# Patient Record
Sex: Female | Born: 1945 | Race: Black or African American | Hispanic: No | Marital: Married | Smoking: Former smoker
Health system: Southern US, Community
[De-identification: ages and names within clinical notes are randomized; demographics above are authoritative.]

## PROBLEM LIST (undated history)

## (undated) DIAGNOSIS — I10 Essential (primary) hypertension: Secondary | ICD-10-CM

## (undated) DIAGNOSIS — M898X9 Other specified disorders of bone, unspecified site: Secondary | ICD-10-CM

## (undated) DIAGNOSIS — M1990 Unspecified osteoarthritis, unspecified site: Secondary | ICD-10-CM

## (undated) DIAGNOSIS — G4730 Sleep apnea, unspecified: Secondary | ICD-10-CM

## (undated) DIAGNOSIS — E785 Hyperlipidemia, unspecified: Secondary | ICD-10-CM

## (undated) DIAGNOSIS — E669 Obesity, unspecified: Secondary | ICD-10-CM

## (undated) DIAGNOSIS — I2089 Other forms of angina pectoris: Secondary | ICD-10-CM

## (undated) DIAGNOSIS — I498 Other specified cardiac arrhythmias: Secondary | ICD-10-CM

## (undated) DIAGNOSIS — I440 Atrioventricular block, first degree: Secondary | ICD-10-CM

## (undated) DIAGNOSIS — I208 Other forms of angina pectoris: Secondary | ICD-10-CM

## (undated) DIAGNOSIS — H269 Unspecified cataract: Secondary | ICD-10-CM

## (undated) DIAGNOSIS — M2550 Pain in unspecified joint: Secondary | ICD-10-CM

## (undated) HISTORY — DX: Hyperlipidemia, unspecified: E78.5

## (undated) HISTORY — DX: Pain in unspecified joint: M25.50

## (undated) HISTORY — DX: Essential (primary) hypertension: I10

## (undated) HISTORY — DX: Sleep apnea, unspecified: G47.30

## (undated) HISTORY — PX: EYE SURGERY: SHX253

## (undated) HISTORY — PX: ECTOPIC PREGNANCY SURGERY: SHX613

## (undated) HISTORY — PX: TONSILECTOMY, ADENOIDECTOMY, BILATERAL MYRINGOTOMY AND TUBES: SHX2538

## (undated) HISTORY — PX: BREAST SURGERY: SHX581

## (undated) HISTORY — PX: COLONOSCOPY: SHX174

## (undated) HISTORY — DX: Unspecified cataract: H26.9

## (undated) HISTORY — PX: APPENDECTOMY: SHX54

## (undated) HISTORY — PX: JOINT REPLACEMENT: SHX530

## (undated) HISTORY — PX: TONSILLECTOMY: SUR1361

## (undated) HISTORY — PX: BUNIONECTOMY: SHX129

## (undated) HISTORY — PX: TUBAL LIGATION: SHX77

---

## 1968-03-03 HISTORY — PX: BREAST EXCISIONAL BIOPSY: SUR124

## 1976-03-03 HISTORY — PX: ECTOPIC PREGNANCY SURGERY: SHX613

## 2006-03-03 HISTORY — PX: BUNIONECTOMY: SHX129

## 2008-10-29 ENCOUNTER — Emergency Department: Payer: Self-pay | Admitting: Emergency Medicine

## 2011-03-14 ENCOUNTER — Ambulatory Visit: Payer: Self-pay | Admitting: Unknown Physician Specialty

## 2011-03-14 LAB — HM COLONOSCOPY

## 2011-06-30 DIAGNOSIS — M1710 Unilateral primary osteoarthritis, unspecified knee: Secondary | ICD-10-CM | POA: Diagnosis not present

## 2011-06-30 DIAGNOSIS — R9431 Abnormal electrocardiogram [ECG] [EKG]: Secondary | ICD-10-CM | POA: Diagnosis not present

## 2011-06-30 DIAGNOSIS — I10 Essential (primary) hypertension: Secondary | ICD-10-CM | POA: Diagnosis not present

## 2011-06-30 DIAGNOSIS — E785 Hyperlipidemia, unspecified: Secondary | ICD-10-CM | POA: Diagnosis not present

## 2011-07-10 DIAGNOSIS — M75100 Unspecified rotator cuff tear or rupture of unspecified shoulder, not specified as traumatic: Secondary | ICD-10-CM | POA: Diagnosis not present

## 2011-07-10 DIAGNOSIS — M1710 Unilateral primary osteoarthritis, unspecified knee: Secondary | ICD-10-CM | POA: Diagnosis not present

## 2011-07-10 DIAGNOSIS — M6530 Trigger finger, unspecified finger: Secondary | ICD-10-CM | POA: Diagnosis not present

## 2011-07-22 DIAGNOSIS — R011 Cardiac murmur, unspecified: Secondary | ICD-10-CM | POA: Diagnosis not present

## 2011-07-22 DIAGNOSIS — I209 Angina pectoris, unspecified: Secondary | ICD-10-CM | POA: Diagnosis not present

## 2011-07-25 DIAGNOSIS — M6530 Trigger finger, unspecified finger: Secondary | ICD-10-CM | POA: Diagnosis not present

## 2011-07-25 DIAGNOSIS — M75100 Unspecified rotator cuff tear or rupture of unspecified shoulder, not specified as traumatic: Secondary | ICD-10-CM | POA: Diagnosis not present

## 2011-07-25 DIAGNOSIS — M1710 Unilateral primary osteoarthritis, unspecified knee: Secondary | ICD-10-CM | POA: Diagnosis not present

## 2011-07-30 ENCOUNTER — Ambulatory Visit: Payer: Self-pay | Admitting: Internal Medicine

## 2011-07-30 DIAGNOSIS — I209 Angina pectoris, unspecified: Secondary | ICD-10-CM | POA: Diagnosis not present

## 2011-08-07 DIAGNOSIS — B350 Tinea barbae and tinea capitis: Secondary | ICD-10-CM | POA: Diagnosis not present

## 2011-08-21 DIAGNOSIS — R9431 Abnormal electrocardiogram [ECG] [EKG]: Secondary | ICD-10-CM | POA: Diagnosis not present

## 2011-08-21 DIAGNOSIS — I209 Angina pectoris, unspecified: Secondary | ICD-10-CM | POA: Diagnosis not present

## 2011-09-03 DIAGNOSIS — E785 Hyperlipidemia, unspecified: Secondary | ICD-10-CM | POA: Diagnosis not present

## 2011-09-03 DIAGNOSIS — I10 Essential (primary) hypertension: Secondary | ICD-10-CM | POA: Diagnosis not present

## 2012-01-26 DIAGNOSIS — R9431 Abnormal electrocardiogram [ECG] [EKG]: Secondary | ICD-10-CM | POA: Diagnosis not present

## 2012-01-26 DIAGNOSIS — I10 Essential (primary) hypertension: Secondary | ICD-10-CM | POA: Diagnosis not present

## 2012-01-27 DIAGNOSIS — Z1159 Encounter for screening for other viral diseases: Secondary | ICD-10-CM | POA: Diagnosis not present

## 2012-01-27 DIAGNOSIS — E785 Hyperlipidemia, unspecified: Secondary | ICD-10-CM | POA: Diagnosis not present

## 2012-01-27 DIAGNOSIS — E8881 Metabolic syndrome: Secondary | ICD-10-CM | POA: Diagnosis not present

## 2012-01-27 DIAGNOSIS — Z23 Encounter for immunization: Secondary | ICD-10-CM | POA: Diagnosis not present

## 2012-01-27 DIAGNOSIS — Z791 Long term (current) use of non-steroidal anti-inflammatories (NSAID): Secondary | ICD-10-CM | POA: Diagnosis not present

## 2012-01-27 DIAGNOSIS — Z0000 Encounter for general adult medical examination without abnormal findings: Secondary | ICD-10-CM | POA: Diagnosis not present

## 2012-01-27 DIAGNOSIS — E2839 Other primary ovarian failure: Secondary | ICD-10-CM | POA: Diagnosis not present

## 2012-01-27 DIAGNOSIS — Z124 Encounter for screening for malignant neoplasm of cervix: Secondary | ICD-10-CM | POA: Diagnosis not present

## 2012-02-11 ENCOUNTER — Ambulatory Visit: Payer: Self-pay | Admitting: Family Medicine

## 2012-02-11 DIAGNOSIS — N951 Menopausal and female climacteric states: Secondary | ICD-10-CM | POA: Diagnosis not present

## 2012-02-11 DIAGNOSIS — L919 Hypertrophic disorder of the skin, unspecified: Secondary | ICD-10-CM | POA: Diagnosis not present

## 2012-02-11 DIAGNOSIS — L909 Atrophic disorder of skin, unspecified: Secondary | ICD-10-CM | POA: Diagnosis not present

## 2012-02-11 LAB — HM DEXA SCAN: HM DEXA SCAN: NORMAL

## 2012-02-17 DIAGNOSIS — M6530 Trigger finger, unspecified finger: Secondary | ICD-10-CM | POA: Diagnosis not present

## 2012-03-08 DIAGNOSIS — Z01419 Encounter for gynecological examination (general) (routine) without abnormal findings: Secondary | ICD-10-CM | POA: Diagnosis not present

## 2012-03-08 DIAGNOSIS — Z124 Encounter for screening for malignant neoplasm of cervix: Secondary | ICD-10-CM | POA: Diagnosis not present

## 2012-03-10 LAB — HM PAP SMEAR: HM Pap smear: NORMAL

## 2012-03-15 ENCOUNTER — Ambulatory Visit: Payer: Self-pay | Admitting: Family Medicine

## 2012-03-15 DIAGNOSIS — R05 Cough: Secondary | ICD-10-CM | POA: Diagnosis not present

## 2012-03-15 DIAGNOSIS — R059 Cough, unspecified: Secondary | ICD-10-CM | POA: Diagnosis not present

## 2012-03-15 DIAGNOSIS — R062 Wheezing: Secondary | ICD-10-CM | POA: Diagnosis not present

## 2012-03-16 ENCOUNTER — Ambulatory Visit: Payer: Self-pay | Admitting: Obstetrics and Gynecology

## 2012-03-16 DIAGNOSIS — Z1231 Encounter for screening mammogram for malignant neoplasm of breast: Secondary | ICD-10-CM | POA: Diagnosis not present

## 2012-07-28 DIAGNOSIS — E8881 Metabolic syndrome: Secondary | ICD-10-CM | POA: Diagnosis not present

## 2012-07-28 DIAGNOSIS — I10 Essential (primary) hypertension: Secondary | ICD-10-CM | POA: Diagnosis not present

## 2012-07-28 DIAGNOSIS — E669 Obesity, unspecified: Secondary | ICD-10-CM | POA: Diagnosis not present

## 2012-07-28 DIAGNOSIS — E785 Hyperlipidemia, unspecified: Secondary | ICD-10-CM | POA: Diagnosis not present

## 2012-07-29 DIAGNOSIS — E785 Hyperlipidemia, unspecified: Secondary | ICD-10-CM | POA: Diagnosis not present

## 2012-07-29 DIAGNOSIS — I10 Essential (primary) hypertension: Secondary | ICD-10-CM | POA: Diagnosis not present

## 2012-12-21 DIAGNOSIS — Z23 Encounter for immunization: Secondary | ICD-10-CM | POA: Diagnosis not present

## 2012-12-23 DIAGNOSIS — R011 Cardiac murmur, unspecified: Secondary | ICD-10-CM | POA: Diagnosis not present

## 2012-12-23 DIAGNOSIS — R079 Chest pain, unspecified: Secondary | ICD-10-CM | POA: Diagnosis not present

## 2013-01-25 DIAGNOSIS — I10 Essential (primary) hypertension: Secondary | ICD-10-CM | POA: Diagnosis not present

## 2013-01-25 DIAGNOSIS — E785 Hyperlipidemia, unspecified: Secondary | ICD-10-CM | POA: Diagnosis not present

## 2013-01-25 DIAGNOSIS — E669 Obesity, unspecified: Secondary | ICD-10-CM | POA: Diagnosis not present

## 2013-01-25 DIAGNOSIS — E8881 Metabolic syndrome: Secondary | ICD-10-CM | POA: Diagnosis not present

## 2013-02-08 DIAGNOSIS — Z1331 Encounter for screening for depression: Secondary | ICD-10-CM | POA: Diagnosis not present

## 2013-02-08 DIAGNOSIS — E8881 Metabolic syndrome: Secondary | ICD-10-CM | POA: Diagnosis not present

## 2013-02-08 DIAGNOSIS — Z0000 Encounter for general adult medical examination without abnormal findings: Secondary | ICD-10-CM | POA: Diagnosis not present

## 2013-02-08 DIAGNOSIS — Z1239 Encounter for other screening for malignant neoplasm of breast: Secondary | ICD-10-CM | POA: Diagnosis not present

## 2013-02-08 DIAGNOSIS — Z9181 History of falling: Secondary | ICD-10-CM | POA: Diagnosis not present

## 2013-03-09 DIAGNOSIS — R232 Flushing: Secondary | ICD-10-CM | POA: Diagnosis not present

## 2013-03-17 ENCOUNTER — Ambulatory Visit: Payer: Self-pay | Admitting: Obstetrics and Gynecology

## 2013-03-17 DIAGNOSIS — R928 Other abnormal and inconclusive findings on diagnostic imaging of breast: Secondary | ICD-10-CM | POA: Diagnosis not present

## 2013-03-17 DIAGNOSIS — Z1231 Encounter for screening mammogram for malignant neoplasm of breast: Secondary | ICD-10-CM | POA: Diagnosis not present

## 2013-03-17 LAB — HM MAMMOGRAPHY: HM MAMMO: NORMAL

## 2013-03-24 ENCOUNTER — Ambulatory Visit: Payer: Self-pay | Admitting: Family Medicine

## 2013-03-24 DIAGNOSIS — R922 Inconclusive mammogram: Secondary | ICD-10-CM | POA: Diagnosis not present

## 2013-03-24 DIAGNOSIS — N63 Unspecified lump in unspecified breast: Secondary | ICD-10-CM | POA: Diagnosis not present

## 2013-05-05 DIAGNOSIS — H2510 Age-related nuclear cataract, unspecified eye: Secondary | ICD-10-CM | POA: Diagnosis not present

## 2013-07-19 DIAGNOSIS — H02839 Dermatochalasis of unspecified eye, unspecified eyelid: Secondary | ICD-10-CM | POA: Diagnosis not present

## 2013-07-19 DIAGNOSIS — H18419 Arcus senilis, unspecified eye: Secondary | ICD-10-CM | POA: Diagnosis not present

## 2013-07-19 DIAGNOSIS — H25019 Cortical age-related cataract, unspecified eye: Secondary | ICD-10-CM | POA: Diagnosis not present

## 2013-07-19 DIAGNOSIS — I10 Essential (primary) hypertension: Secondary | ICD-10-CM | POA: Diagnosis not present

## 2013-07-26 ENCOUNTER — Ambulatory Visit: Payer: Self-pay | Admitting: Family Medicine

## 2013-07-26 DIAGNOSIS — E785 Hyperlipidemia, unspecified: Secondary | ICD-10-CM | POA: Diagnosis not present

## 2013-07-26 DIAGNOSIS — G8929 Other chronic pain: Secondary | ICD-10-CM | POA: Diagnosis not present

## 2013-07-26 DIAGNOSIS — I10 Essential (primary) hypertension: Secondary | ICD-10-CM | POA: Diagnosis not present

## 2013-07-26 DIAGNOSIS — E8881 Metabolic syndrome: Secondary | ICD-10-CM | POA: Diagnosis not present

## 2013-07-26 DIAGNOSIS — M5137 Other intervertebral disc degeneration, lumbosacral region: Secondary | ICD-10-CM | POA: Diagnosis not present

## 2013-07-26 DIAGNOSIS — M549 Dorsalgia, unspecified: Secondary | ICD-10-CM | POA: Diagnosis not present

## 2013-07-26 DIAGNOSIS — M47817 Spondylosis without myelopathy or radiculopathy, lumbosacral region: Secondary | ICD-10-CM | POA: Diagnosis not present

## 2013-07-26 LAB — HEMOGLOBIN A1C: Hgb A1c MFr Bld: 4.9 % (ref 4.0–6.0)

## 2013-07-26 LAB — LIPID PANEL
Cholesterol: 157 mg/dL (ref 0–200)
HDL: 68 mg/dL (ref 35–70)
LDL CALC: 77 mg/dL
Triglycerides: 59 mg/dL (ref 40–160)

## 2013-09-02 DIAGNOSIS — H6120 Impacted cerumen, unspecified ear: Secondary | ICD-10-CM | POA: Diagnosis not present

## 2013-09-02 DIAGNOSIS — IMO0002 Reserved for concepts with insufficient information to code with codable children: Secondary | ICD-10-CM | POA: Diagnosis not present

## 2013-09-02 DIAGNOSIS — T169XXA Foreign body in ear, unspecified ear, initial encounter: Secondary | ICD-10-CM | POA: Diagnosis not present

## 2013-10-01 HISTORY — PX: CATARACT EXTRACTION, BILATERAL: SHX1313

## 2013-10-10 DIAGNOSIS — H2510 Age-related nuclear cataract, unspecified eye: Secondary | ICD-10-CM | POA: Diagnosis not present

## 2013-10-10 DIAGNOSIS — H269 Unspecified cataract: Secondary | ICD-10-CM | POA: Diagnosis not present

## 2013-10-11 DIAGNOSIS — H2510 Age-related nuclear cataract, unspecified eye: Secondary | ICD-10-CM | POA: Diagnosis not present

## 2013-10-24 DIAGNOSIS — H269 Unspecified cataract: Secondary | ICD-10-CM | POA: Diagnosis not present

## 2013-10-24 DIAGNOSIS — H2510 Age-related nuclear cataract, unspecified eye: Secondary | ICD-10-CM | POA: Diagnosis not present

## 2013-11-14 DIAGNOSIS — H43819 Vitreous degeneration, unspecified eye: Secondary | ICD-10-CM | POA: Diagnosis not present

## 2013-12-13 DIAGNOSIS — I10 Essential (primary) hypertension: Secondary | ICD-10-CM | POA: Diagnosis not present

## 2013-12-13 DIAGNOSIS — R9431 Abnormal electrocardiogram [ECG] [EKG]: Secondary | ICD-10-CM | POA: Diagnosis not present

## 2013-12-13 DIAGNOSIS — E669 Obesity, unspecified: Secondary | ICD-10-CM | POA: Diagnosis not present

## 2013-12-13 DIAGNOSIS — E785 Hyperlipidemia, unspecified: Secondary | ICD-10-CM | POA: Diagnosis not present

## 2014-01-30 DIAGNOSIS — J019 Acute sinusitis, unspecified: Secondary | ICD-10-CM | POA: Diagnosis not present

## 2014-01-30 DIAGNOSIS — J209 Acute bronchitis, unspecified: Secondary | ICD-10-CM | POA: Diagnosis not present

## 2014-02-10 DIAGNOSIS — E785 Hyperlipidemia, unspecified: Secondary | ICD-10-CM | POA: Diagnosis not present

## 2014-02-10 DIAGNOSIS — R7301 Impaired fasting glucose: Secondary | ICD-10-CM | POA: Diagnosis not present

## 2014-02-10 DIAGNOSIS — Z1389 Encounter for screening for other disorder: Secondary | ICD-10-CM | POA: Diagnosis not present

## 2014-02-10 DIAGNOSIS — Z23 Encounter for immunization: Secondary | ICD-10-CM | POA: Diagnosis not present

## 2014-02-10 DIAGNOSIS — I10 Essential (primary) hypertension: Secondary | ICD-10-CM | POA: Diagnosis not present

## 2014-02-10 DIAGNOSIS — Z0000 Encounter for general adult medical examination without abnormal findings: Secondary | ICD-10-CM | POA: Diagnosis not present

## 2014-03-16 DIAGNOSIS — R232 Flushing: Secondary | ICD-10-CM | POA: Diagnosis not present

## 2014-03-16 DIAGNOSIS — Z779 Other contact with and (suspected) exposures hazardous to health: Secondary | ICD-10-CM | POA: Diagnosis not present

## 2014-04-04 ENCOUNTER — Ambulatory Visit: Payer: Self-pay | Admitting: Obstetrics and Gynecology

## 2014-04-04 DIAGNOSIS — Z1231 Encounter for screening mammogram for malignant neoplasm of breast: Secondary | ICD-10-CM | POA: Diagnosis not present

## 2014-06-15 DIAGNOSIS — H3561 Retinal hemorrhage, right eye: Secondary | ICD-10-CM | POA: Diagnosis not present

## 2014-06-15 DIAGNOSIS — H43812 Vitreous degeneration, left eye: Secondary | ICD-10-CM | POA: Diagnosis not present

## 2014-06-15 DIAGNOSIS — H524 Presbyopia: Secondary | ICD-10-CM | POA: Diagnosis not present

## 2014-06-22 ENCOUNTER — Encounter: Payer: Self-pay | Admitting: Family Medicine

## 2014-06-22 DIAGNOSIS — M170 Bilateral primary osteoarthritis of knee: Secondary | ICD-10-CM | POA: Insufficient documentation

## 2014-06-22 DIAGNOSIS — Z780 Asymptomatic menopausal state: Secondary | ICD-10-CM | POA: Insufficient documentation

## 2014-06-22 DIAGNOSIS — E669 Obesity, unspecified: Secondary | ICD-10-CM | POA: Insufficient documentation

## 2014-06-22 DIAGNOSIS — G8929 Other chronic pain: Secondary | ICD-10-CM | POA: Insufficient documentation

## 2014-06-22 DIAGNOSIS — I10 Essential (primary) hypertension: Secondary | ICD-10-CM | POA: Insufficient documentation

## 2014-06-22 DIAGNOSIS — M6530 Trigger finger, unspecified finger: Secondary | ICD-10-CM | POA: Insufficient documentation

## 2014-06-22 DIAGNOSIS — E8881 Metabolic syndrome: Secondary | ICD-10-CM | POA: Insufficient documentation

## 2014-06-22 DIAGNOSIS — R9431 Abnormal electrocardiogram [ECG] [EKG]: Secondary | ICD-10-CM | POA: Insufficient documentation

## 2014-06-22 DIAGNOSIS — Z8619 Personal history of other infectious and parasitic diseases: Secondary | ICD-10-CM | POA: Insufficient documentation

## 2014-06-22 DIAGNOSIS — M549 Dorsalgia, unspecified: Secondary | ICD-10-CM

## 2014-06-22 DIAGNOSIS — E785 Hyperlipidemia, unspecified: Secondary | ICD-10-CM | POA: Insufficient documentation

## 2014-06-22 DIAGNOSIS — R7301 Impaired fasting glucose: Secondary | ICD-10-CM | POA: Insufficient documentation

## 2014-06-23 ENCOUNTER — Encounter: Payer: Self-pay | Admitting: Family Medicine

## 2014-07-05 DIAGNOSIS — H3561 Retinal hemorrhage, right eye: Secondary | ICD-10-CM | POA: Diagnosis not present

## 2014-07-05 DIAGNOSIS — H43812 Vitreous degeneration, left eye: Secondary | ICD-10-CM | POA: Diagnosis not present

## 2014-08-09 ENCOUNTER — Telehealth: Payer: Self-pay | Admitting: Family Medicine

## 2014-08-09 ENCOUNTER — Other Ambulatory Visit: Payer: Self-pay | Admitting: Family Medicine

## 2014-08-09 MED ORDER — VALSARTAN-HYDROCHLOROTHIAZIDE 320-25 MG PO TABS: 1.0000 | ORAL_TABLET | Freq: Every day | ORAL | Status: DC

## 2014-08-09 NOTE — Telephone Encounter (Signed)
Patient notified

## 2014-08-09 NOTE — Telephone Encounter (Signed)
Sent, please notify patient, thank you

## 2014-08-13 ENCOUNTER — Encounter: Payer: Self-pay | Admitting: Family Medicine

## 2014-08-13 ENCOUNTER — Other Ambulatory Visit: Payer: Self-pay | Admitting: Family Medicine

## 2014-08-14 ENCOUNTER — Encounter: Payer: Self-pay | Admitting: Family Medicine

## 2014-08-14 ENCOUNTER — Encounter (INDEPENDENT_AMBULATORY_CARE_PROVIDER_SITE_OTHER): Payer: Self-pay

## 2014-08-14 ENCOUNTER — Ambulatory Visit (INDEPENDENT_AMBULATORY_CARE_PROVIDER_SITE_OTHER): Payer: Medicare Other | Admitting: Family Medicine

## 2014-08-14 VITALS — BP 126/80 | HR 71 | Temp 97.8°F | Resp 14 | Ht 63.0 in | Wt 174.4 lb

## 2014-08-14 DIAGNOSIS — I10 Essential (primary) hypertension: Secondary | ICD-10-CM | POA: Diagnosis not present

## 2014-08-14 DIAGNOSIS — Z23 Encounter for immunization: Secondary | ICD-10-CM

## 2014-08-14 DIAGNOSIS — E785 Hyperlipidemia, unspecified: Secondary | ICD-10-CM | POA: Diagnosis not present

## 2014-08-14 DIAGNOSIS — E669 Obesity, unspecified: Secondary | ICD-10-CM

## 2014-08-14 DIAGNOSIS — H6122 Impacted cerumen, left ear: Secondary | ICD-10-CM

## 2014-08-14 DIAGNOSIS — E8881 Metabolic syndrome: Secondary | ICD-10-CM

## 2014-08-14 DIAGNOSIS — R198 Other specified symptoms and signs involving the digestive system and abdomen: Secondary | ICD-10-CM

## 2014-08-14 DIAGNOSIS — R739 Hyperglycemia, unspecified: Secondary | ICD-10-CM | POA: Diagnosis not present

## 2014-08-14 DIAGNOSIS — M25512 Pain in left shoulder: Secondary | ICD-10-CM | POA: Insufficient documentation

## 2014-08-14 DIAGNOSIS — R194 Change in bowel habit: Secondary | ICD-10-CM

## 2014-08-14 MED ORDER — VALSARTAN-HYDROCHLOROTHIAZIDE 320-25 MG PO TABS: 1.0000 | ORAL_TABLET | Freq: Every day | ORAL | Status: DC

## 2014-08-14 MED ORDER — ROSUVASTATIN CALCIUM 10 MG PO TABS: 10.0000 mg | ORAL_TABLET | Freq: Every day | ORAL | Status: DC

## 2014-08-14 NOTE — Progress Notes (Signed)
Name: Lisa Steele   MRN: [id number]    DOB: 1945/04/11   Date:08/14/2014       Progress Note  Subjective  Chief Complaint  Chief Complaint  Patient presents with  . Hypertension  . Hyperlipidemia  . Constipation    Wants to know if coming from new BP med? taking stool softner,laxatives.  Its been going on since Jan.  . Ear Fullness    ? clogged with wax, wants it checked    HPI  Change in bowel movement: she used to have daily bowel movements without straining, but over the past few months she has noticed a change in bowel movements. Occasionally no bowel movements and has to strain. Her colonoscopy is up to date, last one in 2013 and it was normal. No family history of colon cancer. No blood in stools, no change in appetite. She has lost 28 lbs in the past 6 months  HTN: doing well, taking medication as prescribed, no side effects. BP is checked at home , a few times month and it is under control below 130/80  Hyperlipidemia: doing well on Crestor and denies side effects, last lipid panel was one year ago and we will repeat it today  Shoulder: left shoulder, intermittent, mild to moderate, usually with abduction of left shoulder, no weakness, no numbness  Metabolic Syndrome: no polydipsia, polyuria, she has been working on her diet and exercise, goes to the gym, to lose weight.   Ear fullness : Left ear feeling full for the past months, no pain, no hearing loss. No trauma    Patient Active Problem List   Diagnosis Date Noted  . Left shoulder pain 08/14/2014  . Constipation 08/14/2014  . Primary osteoarthritis of both knees 06/22/2014  . Obesity (BMI 30-39.9) 06/22/2014  . Dysmetabolic syndrome 95/62/1308  . Post-menopausal 06/22/2014  . H/O cold sores 06/22/2014  . Dyslipidemia 06/22/2014  . Elevated fasting blood sugar 06/22/2014  . Benign essential HTN 06/22/2014  . Abnormal ECG 06/22/2014    Past Surgical History  Procedure Laterality Date  . Breast surgery       cyst removed  . Ectopic pregnancy surgery    . Tonsilectomy, adenoidectomy, bilateral myringotomy and tubes    . Bunionectomy    . Eye surgery    . Cataract extraction, bilateral  10/2013    Family History  Problem Relation Age of Onset  . Diabetes Mother   . Hypertension Mother   . Heart disease Mother   . Hyperlipidemia Mother   . Heart disease Father   . Diabetes Sister   . Heart disease Sister   . Hypertension Sister   . Hyperlipidemia Sister   . Cancer Sister     breast    History   Social History  . Marital Status: Married    Spouse Name: N/A  . Number of Children: N/A  . Years of Education: N/A   Occupational History  . Not on file.   Social History Main Topics  . Smoking status: Former Smoker -- 10 years    Start date: 03/03/1958  . Smokeless tobacco: Never Used  . Alcohol Use: 0.0 oz/week    0 Standard drinks or equivalent per week     Comment: socially  . Drug Use: No  . Sexual Activity: No   Other Topics Concern  . Not on file   Social History Narrative     Current outpatient prescriptions:  .  acetaminophen (TYLENOL) 500 MG tablet, Take 1 tablet by  mouth 3 (three) times daily., Disp: , Rfl:  .  aspirin 81 MG tablet, Take 1 tablet by mouth daily., Disp: , Rfl:  .  rosuvastatin (CRESTOR) 10 MG tablet, Take 1 tablet (10 mg total) by mouth daily., Disp: 90 tablet, Rfl: 1 .  valsartan-hydrochlorothiazide (DIOVAN HCT) 320-25 MG per tablet, Take 1 tablet by mouth daily., Disp: 90 tablet, Rfl: 1  Allergies  Allergen Reactions  . Penicillins Hives and Rash    leg lesions and weakness (couldn't walk)     ROS  Constitutional: Negative for fever or weight change.  Respiratory: Negative for cough and shortness of breath.   Cardiovascular: Negative for chest pain or palpitations.  Gastrointestinal: Negative for abdominal pain, change in bowel movements Musculoskeletal: Negative for gait problem or joint swelling.  Skin: Negative for rash.   Neurological: Negative for dizziness or headache.  No other specific complaints in a complete review of systems (except as listed in HPI above).  Objective  Filed Vitals:   08/14/14 0807  BP: 126/80  Pulse: 71  Temp: 97.8 F (36.6 C)  TempSrc: Oral  Resp: 14  Height: '5\' 3"'  (1.6 m)  Weight: 174 lb 6.4 oz (79.107 kg)  SpO2: 96%    Body mass index is 30.9 kg/(m^2).  Physical Exam  Constitutional: Patient appears well-developed and well-nourished. No distress.  HENT: Head: Normocephalic and atraumatic. Ears: B TMs ok, no erythema or effusion; Nose: Nose normal. Mouth/Throat: Oropharynx is clear and moist. No oropharyngeal exudate. Wax on left ear canal Eyes: Conjunctivae and EOM are normal. Pupils are equal, round, and reactive to light. No scleral icterus.  Neck: Normal range of motion. Neck supple. No JVD present. No thyromegaly present.  Cardiovascular: Normal rate, regular rhythm and normal heart sounds.  No murmur heard. No BLE edema. Pulmonary/Chest: Effort normal and breath sounds normal. No respiratory distress. Abdominal: Soft. Bowel sounds are normal, no distension. There is no tenderness. no masses Musculoskeletal: Normal range of motion, no joint effusions. No gross deformities Neurological: he is alert and oriented to person, place, and time. No cranial nerve deficit. Coordination, balance, strength, speech and gait are normal.  Skin: Skin is warm and dry. No rash noted. No erythema.  Psychiatric: Patient has a normal mood and affect. behavior is normal. Judgment and thought content normal.   PHQ2/9: Depression screen PHQ 2/9 08/14/2014  Decreased Interest 0  Down, Depressed, Hopeless 0  PHQ - 2 Score 0     Fall Risk: Fall Risk  08/14/2014  Falls in the past year? No   1. Benign essential HTN At goal, continue medication - valsartan-hydrochlorothiazide (DIOVAN HCT) 320-25 MG per tablet; Take 1 tablet by mouth daily.  Dispense: 90 tablet; Refill: 1 - Comp Met  (CMET)  2. Change in bowel movement Also has weight loss - Ambulatory referral to Gastroenterology  3. Cerumen impaction, left  - Ear Lavage : verbal consent given, ear lavage done with peroxide and warm water, patient did not tolerated procedure well she got dizzy and diaphoretic but back to baseline after a few minutes.  The remaining of cerumen was removed with a curette   4. Left shoulder pain Advised to continue prn otc medication and exercise/home PT  5. Need for pneumococcal vaccine  - Pneumococcal conjugate vaccine 13-valent  6. Dysmetabolic syndrome Recheck labs  7. Dyslipidemia Recheck labs - rosuvastatin (CRESTOR) 10 MG tablet; Take 1 tablet (10 mg total) by mouth daily.  Dispense: 90 tablet; Refill: 1 - Lipid Profile  8.  Obesity (BMI 30-39.9) Doing well with diet and exercise, losing weight 29 lbs in the past 6 months  9. Hyperglycemia  - HgB A1c

## 2014-08-18 DIAGNOSIS — E785 Hyperlipidemia, unspecified: Secondary | ICD-10-CM | POA: Diagnosis not present

## 2014-08-18 DIAGNOSIS — I10 Essential (primary) hypertension: Secondary | ICD-10-CM | POA: Diagnosis not present

## 2014-08-18 DIAGNOSIS — R739 Hyperglycemia, unspecified: Secondary | ICD-10-CM | POA: Diagnosis not present

## 2014-08-19 LAB — HEMOGLOBIN A1C
ESTIMATED AVERAGE GLUCOSE: 94 mg/dL
HEMOGLOBIN A1C: 4.9 % (ref 4.8–5.6)

## 2014-08-19 LAB — COMPREHENSIVE METABOLIC PANEL WITH GFR
ALT: 21 [IU]/L (ref 0–32)
AST: 18 [IU]/L (ref 0–40)
Albumin/Globulin Ratio: 1.6 (ref 1.1–2.5)
Albumin: 4.2 g/dL (ref 3.6–4.8)
Alkaline Phosphatase: 67 [IU]/L (ref 39–117)
BUN/Creatinine Ratio: 12 (ref 11–26)
BUN: 9 mg/dL (ref 8–27)
Bilirubin Total: 0.5 mg/dL (ref 0.0–1.2)
CO2: 29 mmol/L (ref 18–29)
Calcium: 9.6 mg/dL (ref 8.7–10.3)
Chloride: 98 mmol/L (ref 97–108)
Creatinine, Ser: 0.77 mg/dL (ref 0.57–1.00)
GFR calc Af Amer: 92 mL/min/{1.73_m2}
GFR calc non Af Amer: 80 mL/min/{1.73_m2}
Globulin, Total: 2.7 g/dL (ref 1.5–4.5)
Glucose: 105 mg/dL — ABNORMAL HIGH (ref 65–99)
Potassium: 4.5 mmol/L (ref 3.5–5.2)
Sodium: 141 mmol/L (ref 134–144)
Total Protein: 6.9 g/dL (ref 6.0–8.5)

## 2014-08-19 LAB — LIPID PANEL
Chol/HDL Ratio: 2.2 ratio (ref 0.0–4.4)
Cholesterol, Total: 143 mg/dL (ref 100–199)
HDL: 65 mg/dL
LDL Calculated: 65 mg/dL (ref 0–99)
Triglycerides: 67 mg/dL (ref 0–149)
VLDL Cholesterol Cal: 13 mg/dL (ref 5–40)

## 2014-08-21 NOTE — Progress Notes (Signed)
Patient notified

## 2014-12-14 DIAGNOSIS — E784 Other hyperlipidemia: Secondary | ICD-10-CM | POA: Diagnosis not present

## 2014-12-14 DIAGNOSIS — E669 Obesity, unspecified: Secondary | ICD-10-CM | POA: Diagnosis not present

## 2014-12-14 DIAGNOSIS — J329 Chronic sinusitis, unspecified: Secondary | ICD-10-CM | POA: Diagnosis not present

## 2014-12-14 DIAGNOSIS — R9431 Abnormal electrocardiogram [ECG] [EKG]: Secondary | ICD-10-CM | POA: Diagnosis not present

## 2014-12-14 DIAGNOSIS — I10 Essential (primary) hypertension: Secondary | ICD-10-CM | POA: Diagnosis not present

## 2014-12-29 ENCOUNTER — Other Ambulatory Visit: Payer: Self-pay | Admitting: Family Medicine

## 2014-12-29 NOTE — Telephone Encounter (Signed)
Patient requesting refill. 

## 2015-01-29 ENCOUNTER — Ambulatory Visit (INDEPENDENT_AMBULATORY_CARE_PROVIDER_SITE_OTHER): Payer: Medicare Other

## 2015-01-29 DIAGNOSIS — Z23 Encounter for immunization: Secondary | ICD-10-CM

## 2015-02-13 ENCOUNTER — Encounter: Payer: Self-pay | Admitting: Family Medicine

## 2015-02-13 ENCOUNTER — Ambulatory Visit (INDEPENDENT_AMBULATORY_CARE_PROVIDER_SITE_OTHER): Payer: Medicare Other | Admitting: Family Medicine

## 2015-02-13 VITALS — BP 120/86 | HR 72 | Temp 97.6°F | Resp 18 | Ht 63.0 in | Wt 195.0 lb

## 2015-02-13 DIAGNOSIS — Z1239 Encounter for other screening for malignant neoplasm of breast: Secondary | ICD-10-CM

## 2015-02-13 DIAGNOSIS — E785 Hyperlipidemia, unspecified: Secondary | ICD-10-CM

## 2015-02-13 DIAGNOSIS — F3289 Other specified depressive episodes: Secondary | ICD-10-CM

## 2015-02-13 DIAGNOSIS — Z1211 Encounter for screening for malignant neoplasm of colon: Secondary | ICD-10-CM

## 2015-02-13 DIAGNOSIS — R7301 Impaired fasting glucose: Secondary | ICD-10-CM | POA: Diagnosis not present

## 2015-02-13 DIAGNOSIS — I10 Essential (primary) hypertension: Secondary | ICD-10-CM | POA: Diagnosis not present

## 2015-02-13 DIAGNOSIS — Z0000 Encounter for general adult medical examination without abnormal findings: Secondary | ICD-10-CM

## 2015-02-13 DIAGNOSIS — F4321 Adjustment disorder with depressed mood: Secondary | ICD-10-CM | POA: Diagnosis not present

## 2015-02-13 DIAGNOSIS — F329 Major depressive disorder, single episode, unspecified: Secondary | ICD-10-CM

## 2015-02-13 MED ORDER — BUPROPION HCL ER (XL) 150 MG PO TB24: 150.0000 mg | ORAL_TABLET | Freq: Every day | ORAL | Status: DC

## 2015-02-13 NOTE — Progress Notes (Signed)
Name: Lisa Steele   MRN: [id number]    DOB: 01-16-1946   Date:02/13/2015       Progress Note  Subjective  Chief Complaint  Chief Complaint  Patient presents with  . Annual Exam    HPI   Functional ability/safety issues: No Issues Hearing issues: Addressed  Activities of daily living: Discussed Home safety issues: No Issues  End Of Life Planning: Offered verbal information regarding advanced directives, healthcare power of attorney.  Preventative care, Health maintenance, Preventative health measures discussed.  Preventative screenings discussed today: lab work, colonoscopy, PAP - sees Dr. Doristine Church , mammogram, DEXA -  Low Dose CT Chest recommended if Age 47-80 years, 30 pack-year currently smoking OR have quit w/in 15years.   Lifestyle risk factor issued reviewed: Diet, exercise, weight management, advised patient smoking is not healthy, nutrition/diet.  Preventative health measures discussed (5-10 year plan).  Reviewed and recommended vaccinations: - Pneumovax  - Prevnar  - Annual Influenza - Zostavax - Tdap   Depression screening: Done Fall risk screening: Done Discuss ADLs/IADLs: Done  Current medical providers: See HPI  Other health risk factors identified this visit: No other issues Cognitive impairment issues: None identified  All above discussed with patient. Appropriate education, counseling and referral will be made based upon the above.   Situational Depression: Older sister moved here from [place] this Fall because she is blind and her husband that was her caregiver died. Also worried about younger sister that was diagnosed breast cancer this Summer. She is a stress eater and has gained weight, discussed options and we will try Wellbutrin. She has no history of depression. She denies crying spells, just feels overwhelmed and it makes her eat  Obesity: she was doing really well with diet and exercise up to the Summer when the stress increased in her life. She is a  stress eater.   HTN: taking bp medication daily, she denies chest pain or palpitation, no SOB  Hyperlipidemia: taking Crestor, no myalgias, compliant with medications    Patient Active Problem List   Diagnosis Date Noted  . Left shoulder pain 08/14/2014  . Primary osteoarthritis of both knees 06/22/2014  . Obesity (BMI 30-39.9) 06/22/2014  . Dysmetabolic syndrome 06/22/2014  . Post-menopausal 06/22/2014  . H/O cold sores 06/22/2014  . Dyslipidemia 06/22/2014  . Elevated fasting blood sugar 06/22/2014  . Benign essential HTN 06/22/2014  . Abnormal ECG 06/22/2014    Past Surgical History  Procedure Laterality Date  . Breast surgery      cyst removed  . Ectopic pregnancy surgery    . Tonsilectomy, adenoidectomy, bilateral myringotomy and tubes    . Bunionectomy    . Eye surgery    . Cataract extraction, bilateral  10/2013    Family History  Problem Relation Age of Onset  . Diabetes Mother   . Hypertension Mother   . Heart disease Mother   . Hyperlipidemia Mother   . Heart disease Father   . Diabetes Sister   . Heart disease Sister   . Hypertension Sister   . Hyperlipidemia Sister   . Cancer Sister     breast    Social History   Social History  . Marital Status: Married    Spouse Name: N/A  . Number of Children: N/A  . Years of Education: N/A   Occupational History  . Not on file.   Social History Main Topics  . Smoking status: Former Smoker -- 10 years    Start date: 03/03/1958  . Smokeless  tobacco: Never Used  . Alcohol Use: 0.0 oz/week    0 Standard drinks or equivalent per week     Comment: socially  . Drug Use: No  . Sexual Activity: No   Other Topics Concern  . Not on file   Social History Narrative     Current outpatient prescriptions:  .  acetaminophen (TYLENOL) 500 MG tablet, Take 1 tablet by mouth 3 (three) times daily., Disp: , Rfl:  .  aspirin 81 MG tablet, Take 1 tablet by mouth daily., Disp: , Rfl:  .  buPROPion (WELLBUTRIN XL)  150 MG 24 hr tablet, Take 1 tablet (150 mg total) by mouth daily., Disp: 30 tablet, Rfl: 0 .  rosuvastatin (CRESTOR) 10 MG tablet, Take 1 tablet by mouth  daily, Disp: 90 tablet, Rfl: 1 .  valsartan-hydrochlorothiazide (DIOVAN-HCT) 320-25 MG tablet, Take 1 tablet by mouth  daily, Disp: 90 tablet, Rfl: 1  Allergies  Allergen Reactions  . Penicillins Hives and Rash    leg lesions and weakness (couldn't walk)     ROS  Constitutional: Negative for fever, significant  weight change - gained.  Respiratory: Negative for cough and shortness of breath.   Cardiovascular: Negative for chest pain or palpitations.  Gastrointestinal: Negative for abdominal pain, no bowel changes.  Musculoskeletal: Negative for gait problem or joint swelling.  Skin: Negative for rash.  Neurological: Negative for dizziness or headache.  No other specific complaints in a complete review of systems (except as listed in HPI above).  Objective  Filed Vitals:   02/13/15 0842  BP: 120/86  Pulse: 72  Temp: 97.6 F (36.4 C)  TempSrc: Oral  Resp: 18  Height:  (1.6 m)  Weight: 195 lb (88.451 kg)  SpO2: 98%    Body mass index is 34.55 kg/(m^2).  Physical Exam  Constitutional: Patient appears well-developed and well-nourished. No distress.  HENT: Head: Normocephalic and atraumatic. Ears: B TMs ok, no erythema or effusion; Nose: Nose normal. Mouth/Throat: Oropharynx is clear and moist. No oropharyngeal exudate.  Eyes: Conjunctivae and EOM are normal. Pupils are equal, round, and reactive to light, s/p cataract surgery, lenses seen on exam. No scleral icterus.  Neck: Normal range of motion. Neck supple. No JVD present. No thyromegaly present.  Cardiovascular: Normal rate, regular rhythm and normal heart sounds.  No murmur heard. No BLE edema. Pulmonary/Chest: Effort normal and breath sounds normal. No respiratory distress. Abdominal: Soft. Bowel sounds are normal, no distension. There is no tenderness. no  masses Breast: no lumps or masses, no nipple discharge or rashes FEMALE GENITALIA:  See gyn RECTAL: not done Musculoskeletal: Normal range of motion, no joint effusions. No gross deformities. Grinding with extension of left knee, no effusion or redness Neurological: he is alert and oriented to person, place, and time. No cranial nerve deficit. Coordination, balance, strength, speech and gait are normal.  Skin: Skin is warm and dry. No rash noted. No erythema.  Psychiatric: Patient has a normal mood and affect. behavior is normal. Judgment and thought content normal.   PHQ2/9: Depression screen [facility] 2/9 02/13/2015 08/14/2014  Decreased Interest 0 0  Down, Depressed, Hopeless 0 0  PHQ - 2 Score 0 0     Fall Risk: Fall Risk  02/13/2015 08/14/2014  Falls in the past year? No No     Functional Status Survey: Is the patient deaf or have difficulty hearing?: No Does the patient have difficulty seeing, even when wearing glasses/contacts?: Yes (glasses) Does the patient have difficulty concentrating, remembering,  or making decisions?: No Does the patient have difficulty walking or climbing stairs?: No Does the patient have difficulty dressing or bathing?: No Does the patient have difficulty doing errands alone such as visiting a doctor's office or shopping?: No   Assessment & Plan  1. Medicare annual wellness visit, subsequent  Discussed importance of 150 minutes of physical activity weekly, eat two servings of fish weekly, eat one serving of tree nuts ( cashews, pistachios, pecans, almonds.Marland Kitchen.) every other day, eat 6 servings of fruit/vegetables daily and drink plenty of water and avoid sweet beverages.   2. Breast cancer screening  - MM Digital Screening; Future  3. Morbid obesity due to excess calories [facility](HCC)  Discussed with the patient the risk posed by an increased BMI. Discussed importance of portion control, calorie counting and at least 150 minutes of physical activity weekly.  Avoid sweet beverages and drink more water. Eat at least 6 servings of fruit and vegetables daily   4. Dyslipidemia  Continue Crestor  5. Elevated fasting blood sugar  Last hgbA1C was fine, needs to lose weight again and resume activity   6. Benign essential HTN  Well controlled, continue medication   7. Reactive depression (situational)  Older sister moved here from [place] this Fall because she is blind and her husband that was her caregiver died. Also worried about younger sister that was diagnosed breast cancer this Summer. She is a stress eater and has gained weight, discussed options and we will try Wellbutrin - buPROPion (WELLBUTRIN XL) 150 MG 24 hr tablet; Take 1 tablet (150 mg total) by mouth daily.  Dispense: 30 tablet; Refill: 0   8. Colon cancer screening  Had colonoscopy in 2008 that showed polyp, in 2013 by Dr. Mechele CollinElliott was normal - repeat in 5 years.

## 2015-02-13 NOTE — Patient Instructions (Signed)
  [name] , Thank you for taking time to come for your Medicare Wellness Visit. I appreciate your ongoing commitment to your health goals. Please review the following plan we discussed and let me know if I can assist you in the future.   These are the goals we discussed:   -resume healthy diet and avoid stress eating -start Wellbutrin and follow up in one month   This is a list of the screening recommended for you and due dates:  Health Maintenance  Topic Date Due  . Mammogram  03/18/2015  . Flu Shot  10/02/2015  . Colon Cancer Screening  03/13/2016  . Tetanus Vaccine  12/26/2020  . DEXA scan (bone density measurement)  Completed  . Shingles Vaccine  Completed  .  Hepatitis C: One time screening is recommended by Center for Disease Control  (CDC) for  adults born from [birth year] through 1964.   Completed  . Pneumonia vaccines  Completed

## 2015-03-16 ENCOUNTER — Ambulatory Visit: Payer: Medicare Other | Admitting: Family Medicine

## 2015-04-24 ENCOUNTER — Other Ambulatory Visit: Payer: Self-pay | Admitting: Family Medicine

## 2015-04-24 DIAGNOSIS — Z779 Other contact with and (suspected) exposures hazardous to health: Secondary | ICD-10-CM | POA: Diagnosis not present

## 2015-04-24 DIAGNOSIS — Z01419 Encounter for gynecological examination (general) (routine) without abnormal findings: Secondary | ICD-10-CM | POA: Diagnosis not present

## 2015-05-10 ENCOUNTER — Ambulatory Visit
Admission: RE | Admit: 2015-05-10 | Discharge: 2015-05-10 | Disposition: A | Discharge status: Home / Self Care | Payer: Medicare Other | Source: Ambulatory Visit | Attending: Family Medicine | Admitting: Family Medicine

## 2015-05-10 ENCOUNTER — Other Ambulatory Visit: Payer: Self-pay | Admitting: Family Medicine

## 2015-05-10 DIAGNOSIS — Z1239 Encounter for other screening for malignant neoplasm of breast: Secondary | ICD-10-CM

## 2015-05-10 DIAGNOSIS — Z1231 Encounter for screening mammogram for malignant neoplasm of breast: Secondary | ICD-10-CM | POA: Insufficient documentation

## 2015-06-16 ENCOUNTER — Other Ambulatory Visit: Payer: Self-pay | Admitting: Family Medicine

## 2015-06-21 DIAGNOSIS — H35372 Puckering of macula, left eye: Secondary | ICD-10-CM | POA: Diagnosis not present

## 2015-07-26 ENCOUNTER — Telehealth: Payer: Self-pay | Admitting: Family Medicine

## 2015-07-26 NOTE — Telephone Encounter (Signed)
It was sent to [facility] back in April Can we call in 30 day to local pharmacy?

## 2015-07-26 NOTE — Telephone Encounter (Signed)
Pt has an appt on 08/08/2015 and pt states her Valsartin will run out 3 days after her appt. She uses [facility] and it normally takes 7 to 10 days to receive it and pt is trying to avoid running out of this medication.

## 2015-07-27 NOTE — Telephone Encounter (Signed)
Blood pressure medication was sent in on 06/17/15 but the refill was too early so [facility] did not fill it. Her last fill date was 04/23/2015 but [facility] stated the patient or office has to call to activate the too early prescription. Called and they are going to overnight her prescription to her from 06/17/15 for 90 day supply with 0 refills. Patient has been notified.

## 2015-08-08 ENCOUNTER — Encounter: Payer: Self-pay | Admitting: Family Medicine

## 2015-08-08 ENCOUNTER — Ambulatory Visit (INDEPENDENT_AMBULATORY_CARE_PROVIDER_SITE_OTHER): Payer: Medicare Other | Admitting: Family Medicine

## 2015-08-08 VITALS — BP 124/72 | HR 68 | Temp 97.9°F | Resp 14 | Ht 63.0 in | Wt 193.3 lb

## 2015-08-08 DIAGNOSIS — I10 Essential (primary) hypertension: Secondary | ICD-10-CM

## 2015-08-08 DIAGNOSIS — E669 Obesity, unspecified: Secondary | ICD-10-CM | POA: Diagnosis not present

## 2015-08-08 DIAGNOSIS — E785 Hyperlipidemia, unspecified: Secondary | ICD-10-CM | POA: Diagnosis not present

## 2015-08-08 DIAGNOSIS — R7301 Impaired fasting glucose: Secondary | ICD-10-CM | POA: Diagnosis not present

## 2015-08-08 DIAGNOSIS — M21611 Bunion of right foot: Secondary | ICD-10-CM | POA: Diagnosis not present

## 2015-08-08 MED ORDER — ROSUVASTATIN CALCIUM 10 MG PO TABS: ORAL_TABLET | ORAL | Status: DC

## 2015-08-08 MED ORDER — VALSARTAN-HYDROCHLOROTHIAZIDE 320-25 MG PO TABS: ORAL_TABLET | ORAL | Status: DC

## 2015-08-08 NOTE — Progress Notes (Signed)
Name: Lisa Steele   MRN: [id number]    DOB: Mar 24, 1945   Date:08/08/2015       Progress Note  Subjective  Chief Complaint  Chief Complaint  Patient presents with  . Referral    podiatrist for bunion removal  . Foot Pain    patient presents with right foot pain  . Hypertension  . Hyperlipidemia    HPI  Situational Depression: Older sister moved here from [place] this Fall because she is blind and sisters are rotating to help her out.  Also worried about younger sister that was diagnosed breast cancer this Summer, she is doing well now - had bilateral mastectomy and finished therapy. She has no history of depression. She denies crying spells, feeling much better now, she never filled rx of Wellbutrin XL.   Obesity: she is back on her life style changes and has lost a couple of lbs since last visit. She is eating healthy and is getting back to her exercise routine  HTN: taking bp medication daily, she denies chest pain , SOB or palpitation.  Hyperlipidemia: taking Crestor, no myalgias, compliant with medications Due for repeat labs  Bunion right: she has a history of left bunion surgery about 8 years ago, pain has been intermittent for many years, over the past couple of months she has noticed constant pain over the right bunion. She states it feels better with massage, worse when sitting stills. She has also noticed burning sensation on top of right foot, not sure if related or two separate problems but started around the same time.   OA both knees: doing well, takes medication prn   Elevated glucose: family history of DM, previous hyperglycemia on labs, last hgbA1C was normal    Patient Active Problem List   Diagnosis Date Noted  . Primary osteoarthritis of both knees 06/22/2014  . Obesity (BMI 30-39.9) 06/22/2014  . Dysmetabolic syndrome 06/22/2014  . Post-menopausal 06/22/2014  . H/O cold sores 06/22/2014  . Dyslipidemia 06/22/2014  . Elevated fasting blood sugar 06/22/2014  .  Benign essential HTN 06/22/2014  . Abnormal ECG 06/22/2014    Past Surgical History  Procedure Laterality Date  . Breast surgery      cyst removed  . Ectopic pregnancy surgery    . Tonsilectomy, adenoidectomy, bilateral myringotomy and tubes    . Bunionectomy    . Eye surgery    . Cataract extraction, bilateral  10/2013  . Breast excisional biopsy Left 1970    Family History  Problem Relation Age of Onset  . Diabetes Mother   . Hypertension Mother   . Heart disease Mother   . Hyperlipidemia Mother   . Heart disease Father   . Diabetes Sister   . Heart disease Sister   . Hypertension Sister   . Hyperlipidemia Sister   . Cancer Sister 80    breast  . Breast cancer Maternal Aunt     Social History   Social History  . Marital Status: Married    Spouse Name: N/A  . Number of Children: N/A  . Years of Education: N/A   Occupational History  . Not on file.   Social History Main Topics  . Smoking status: Former Smoker -- 10 years    Start date: 03/03/1958  . Smokeless tobacco: Never Used  . Alcohol Use: 0.0 oz/week    0 Standard drinks or equivalent per week     Comment: socially  . Drug Use: No  . Sexual Activity: No  Other Topics Concern  . Not on file   Social History Narrative     Current outpatient prescriptions:  .  acetaminophen (TYLENOL) 500 MG tablet, Take 1 tablet by mouth 3 (three) times daily., Disp: , Rfl:  .  aspirin 81 MG tablet, Take 1 tablet by mouth daily., Disp: , Rfl:  .  rosuvastatin (CRESTOR) 10 MG tablet, Take 1 tablet by mouth  daily, Disp: 90 tablet, Rfl: 1 .  valsartan-hydrochlorothiazide (DIOVAN-HCT) 320-25 MG tablet, Take 1 tablet by mouth  daily, Disp: 90 tablet, Rfl: 1  Allergies  Allergen Reactions  . Penicillins Hives and Rash    leg lesions and weakness (couldn't walk)     ROS  Constitutional: Negative for fever or weight change.  Respiratory: Negative for cough and shortness of breath.   Cardiovascular: Negative  for chest pain or palpitations.  Gastrointestinal: Negative for abdominal pain, no bowel changes.  Musculoskeletal: Negative for gait problem or joint swelling.  Skin: Negative for rash.  Neurological: Negative for dizziness or headache.  No other specific complaints in a complete review of systems (except as listed in HPI above).  Objective  Filed Vitals:   08/08/15 0835  BP: 124/72  Pulse: 68  Temp: 97.9 F (36.6 C)  TempSrc: Oral  Resp: 14  Height: 5\' 3"  (1.6 m)  Weight: 193 lb 4.8 oz (87.68 kg)  SpO2: 97%    Body mass index is 34.25 kg/(m^2).  Physical Exam  Constitutional: Patient appears well-developed and well-nourished. Obese No distress.  HEENT: head atraumatic, normocephalic, pupils equal and reactive to light,  neck supple, throat within normal limits Cardiovascular: Normal rate, regular rhythm and normal heart sounds.  No murmur heard. No BLE edema. Pulmonary/Chest: Effort normal and breath sounds normal. No respiratory distress. Abdominal: Soft.  There is no tenderness. Psychiatric: Patient has a normal mood and affect. behavior is normal. Judgment and thought content normal. Muscular skeletal: normal rom of ankle, bunion present on right great toe. Normal rom of knees  PHQ2/9: [facility] 08/08/2015 02/13/2015 08/14/2014  Decreased Interest 0 0 0  Down, Depressed, Hopeless 0 0 0  PHQ - 2 Score 0 0 0    Fall Risk: Fall Risk  08/08/2015 02/13/2015 08/14/2014  Falls in the past year? No No No    Functional Status Survey: Is the patient deaf or have difficulty hearing?: No Does the patient have difficulty seeing, even when wearing glasses/contacts?: No Does the patient have difficulty concentrating, remembering, or making decisions?: No Does the patient have difficulty walking or climbing stairs?: No Does the patient have difficulty dressing or bathing?: No Does the patient have difficulty doing errands alone such as visiting a doctor's office or  shopping?: No   Assessment & Plan  1. Dyslipidemia  - rosuvastatin (CRESTOR) 10 MG tablet; Take 1 tablet by mouth  daily  Dispense: 90 tablet; Refill: 1 - Lipid panel  2. Benign essential HTN  Well controlled  - valsartan-hydrochlorothiazide (DIOVAN-HCT) 320-25 MG tablet; Take 1 tablet by mouth  daily  Dispense: 90 tablet; Refill: 1 - Comprehensive metabolic panel  3. Elevated fasting blood sugar  - Hemoglobin A1c  4. Obesity (BMI 30-39.9)  Discussed with the patient the risk posed by an increased BMI. Discussed importance of portion control, calorie counting and at least 150 minutes of physical activity weekly. Avoid sweet beverages and drink more water. Eat at least 6 servings of fruit and vegetables daily   5. Bunion, right  - Ambulatory referral to  Podiatry

## 2015-08-15 DIAGNOSIS — I10 Essential (primary) hypertension: Secondary | ICD-10-CM | POA: Diagnosis not present

## 2015-08-15 DIAGNOSIS — R7301 Impaired fasting glucose: Secondary | ICD-10-CM | POA: Diagnosis not present

## 2015-08-15 DIAGNOSIS — E785 Hyperlipidemia, unspecified: Secondary | ICD-10-CM | POA: Diagnosis not present

## 2015-08-16 LAB — LIPID PANEL
CHOLESTEROL TOTAL: 186 mg/dL (ref 100–199)
Chol/HDL Ratio: 2.3 ratio units (ref 0.0–4.4)
HDL: 81 mg/dL (ref >39)
LDL Calculated: 84 mg/dL (ref 0–99)
TRIGLYCERIDES: 106 mg/dL (ref 0–149)
VLDL Cholesterol Cal: 21 mg/dL (ref 5–40)

## 2015-08-16 LAB — HEMOGLOBIN A1C
Est. average glucose Bld gHb Est-mCnc: 82 mg/dL
Hgb A1c MFr Bld: 4.5 % — ABNORMAL LOW (ref 4.8–5.6)

## 2015-08-16 LAB — COMPREHENSIVE METABOLIC PANEL
A/G RATIO: 1.7 (ref 1.2–2.2)
ALBUMIN: 4.5 g/dL (ref 3.6–4.8)
ALT: 18 IU/L (ref 0–32)
AST: 18 IU/L (ref 0–40)
Alkaline Phosphatase: 82 IU/L (ref 39–117)
BUN/Creatinine Ratio: 11 — ABNORMAL LOW (ref 12–28)
BUN: 9 mg/dL (ref 8–27)
Bilirubin Total: 0.5 mg/dL (ref 0.0–1.2)
CALCIUM: 9.7 mg/dL (ref 8.7–10.3)
CO2: 26 mmol/L (ref 18–29)
Chloride: 99 mmol/L (ref 96–106)
Creatinine, Ser: 0.84 mg/dL (ref 0.57–1.00)
GFR, EST AFRICAN AMERICAN: 82 mL/min/{1.73_m2} (ref >59)
GFR, EST NON AFRICAN AMERICAN: 71 mL/min/{1.73_m2} (ref >59)
GLOBULIN, TOTAL: 2.7 g/dL (ref 1.5–4.5)
Glucose: 120 mg/dL — ABNORMAL HIGH (ref 65–99)
POTASSIUM: 4.5 mmol/L (ref 3.5–5.2)
SODIUM: 143 mmol/L (ref 134–144)
TOTAL PROTEIN: 7.2 g/dL (ref 6.0–8.5)

## 2015-08-21 DIAGNOSIS — M2012 Hallux valgus (acquired), left foot: Secondary | ICD-10-CM | POA: Diagnosis not present

## 2015-08-21 DIAGNOSIS — M2021 Hallux rigidus, right foot: Secondary | ICD-10-CM | POA: Diagnosis not present

## 2015-08-21 DIAGNOSIS — M79672 Pain in left foot: Secondary | ICD-10-CM | POA: Diagnosis not present

## 2015-08-21 DIAGNOSIS — M79671 Pain in right foot: Secondary | ICD-10-CM | POA: Diagnosis not present

## 2015-10-30 DIAGNOSIS — M722 Plantar fascial fibromatosis: Secondary | ICD-10-CM | POA: Diagnosis not present

## 2015-11-27 DIAGNOSIS — M722 Plantar fascial fibromatosis: Secondary | ICD-10-CM | POA: Diagnosis not present

## 2015-12-18 DIAGNOSIS — M722 Plantar fascial fibromatosis: Secondary | ICD-10-CM | POA: Diagnosis not present

## 2015-12-25 ENCOUNTER — Other Ambulatory Visit: Payer: Self-pay | Admitting: Family Medicine

## 2015-12-25 DIAGNOSIS — E785 Hyperlipidemia, unspecified: Secondary | ICD-10-CM

## 2015-12-25 NOTE — Telephone Encounter (Signed)
Patient requesting refill of Crestor to [facility].

## 2015-12-27 DIAGNOSIS — H04123 Dry eye syndrome of bilateral lacrimal glands: Secondary | ICD-10-CM | POA: Diagnosis not present

## 2015-12-27 DIAGNOSIS — H3561 Retinal hemorrhage, right eye: Secondary | ICD-10-CM | POA: Diagnosis not present

## 2015-12-27 DIAGNOSIS — H35372 Puckering of macula, left eye: Secondary | ICD-10-CM | POA: Diagnosis not present

## 2015-12-27 DIAGNOSIS — H3581 Retinal edema: Secondary | ICD-10-CM | POA: Diagnosis not present

## 2016-01-14 DIAGNOSIS — H35033 Hypertensive retinopathy, bilateral: Secondary | ICD-10-CM | POA: Diagnosis not present

## 2016-01-14 DIAGNOSIS — H34831 Tributary (branch) retinal vein occlusion, right eye, with macular edema: Secondary | ICD-10-CM | POA: Diagnosis not present

## 2016-01-14 DIAGNOSIS — H35372 Puckering of macula, left eye: Secondary | ICD-10-CM | POA: Diagnosis not present

## 2016-01-14 DIAGNOSIS — H43812 Vitreous degeneration, left eye: Secondary | ICD-10-CM | POA: Diagnosis not present

## 2016-01-28 DIAGNOSIS — M25551 Pain in right hip: Secondary | ICD-10-CM | POA: Diagnosis not present

## 2016-01-28 DIAGNOSIS — M1611 Unilateral primary osteoarthritis, right hip: Secondary | ICD-10-CM | POA: Diagnosis not present

## 2016-02-05 DIAGNOSIS — M25551 Pain in right hip: Secondary | ICD-10-CM | POA: Diagnosis not present

## 2016-02-05 DIAGNOSIS — G8929 Other chronic pain: Secondary | ICD-10-CM | POA: Diagnosis not present

## 2016-02-06 ENCOUNTER — Other Ambulatory Visit: Payer: Self-pay | Admitting: Orthopedic Surgery

## 2016-02-06 DIAGNOSIS — G8929 Other chronic pain: Secondary | ICD-10-CM

## 2016-02-06 DIAGNOSIS — M25551 Pain in right hip: Principal | ICD-10-CM

## 2016-02-14 ENCOUNTER — Encounter: Payer: Self-pay | Admitting: Family Medicine

## 2016-02-14 ENCOUNTER — Ambulatory Visit (INDEPENDENT_AMBULATORY_CARE_PROVIDER_SITE_OTHER): Payer: Medicare Other | Admitting: Family Medicine

## 2016-02-14 VITALS — BP 132/64 | HR 77 | Temp 98.0°F | Resp 16 | Ht 62.5 in | Wt 206.7 lb

## 2016-02-14 DIAGNOSIS — E784 Other hyperlipidemia: Secondary | ICD-10-CM | POA: Diagnosis not present

## 2016-02-14 DIAGNOSIS — I10 Essential (primary) hypertension: Secondary | ICD-10-CM

## 2016-02-14 DIAGNOSIS — E669 Obesity, unspecified: Secondary | ICD-10-CM

## 2016-02-14 DIAGNOSIS — M722 Plantar fascial fibromatosis: Secondary | ICD-10-CM

## 2016-02-14 DIAGNOSIS — R635 Abnormal weight gain: Secondary | ICD-10-CM | POA: Diagnosis not present

## 2016-02-14 DIAGNOSIS — M25551 Pain in right hip: Secondary | ICD-10-CM

## 2016-02-14 DIAGNOSIS — I83813 Varicose veins of bilateral lower extremities with pain: Secondary | ICD-10-CM

## 2016-02-14 DIAGNOSIS — Z23 Encounter for immunization: Secondary | ICD-10-CM

## 2016-02-14 DIAGNOSIS — Z0001 Encounter for general adult medical examination with abnormal findings: Secondary | ICD-10-CM

## 2016-02-14 DIAGNOSIS — R5383 Other fatigue: Secondary | ICD-10-CM | POA: Diagnosis not present

## 2016-02-14 DIAGNOSIS — E785 Hyperlipidemia, unspecified: Secondary | ICD-10-CM | POA: Diagnosis not present

## 2016-02-14 DIAGNOSIS — G8929 Other chronic pain: Secondary | ICD-10-CM

## 2016-02-14 DIAGNOSIS — R7301 Impaired fasting glucose: Secondary | ICD-10-CM | POA: Diagnosis not present

## 2016-02-14 DIAGNOSIS — Z0000 Encounter for general adult medical examination without abnormal findings: Secondary | ICD-10-CM

## 2016-02-14 LAB — CBC WITH DIFFERENTIAL/PLATELET
BASOS PCT: 1 %
Basophils Absolute: 55 cells/uL (ref 0–200)
EOS PCT: 3 %
Eosinophils Absolute: 165 cells/uL (ref 15–500)
HCT: 45.4 % — ABNORMAL HIGH (ref 35.0–45.0)
Hemoglobin: 14.8 g/dL (ref 11.7–15.5)
Lymphocytes Relative: 42 %
Lymphs Abs: 2310 cells/uL (ref 850–3900)
MCH: 27.6 pg (ref 27.0–33.0)
MCHC: 32.6 g/dL (ref 32.0–36.0)
MCV: 84.5 fL (ref 80.0–100.0)
MONOS PCT: 11 %
MPV: 10.9 fL (ref 7.5–12.5)
Monocytes Absolute: 605 cells/uL (ref 200–950)
NEUTROS ABS: 2365 {cells}/uL (ref 1500–7800)
Neutrophils Relative %: 43 %
PLATELETS: 330 10*3/uL (ref 140–400)
RBC: 5.37 MIL/uL — AB (ref 3.80–5.10)
RDW: 15.4 % — ABNORMAL HIGH (ref 11.0–15.0)
WBC: 5.5 10*3/uL (ref 3.8–10.8)

## 2016-02-14 LAB — CBC
HEMATOCRIT: 45.4 % — AB (ref 35.0–45.0)
HEMOGLOBIN: 14.8 g/dL (ref 11.7–15.5)
MCH: 27.6 pg (ref 27.0–33.0)
MCHC: 32.6 g/dL (ref 32.0–36.0)
MCV: 84.5 fL (ref 80.0–100.0)
MPV: 10.9 fL (ref 7.5–12.5)
Platelets: 330 10*3/uL (ref 140–400)
RBC: 5.37 MIL/uL — ABNORMAL HIGH (ref 3.80–5.10)
RDW: 15.4 % — ABNORMAL HIGH (ref 11.0–15.0)
WBC: 5.5 10*3/uL (ref 3.8–10.8)

## 2016-02-14 LAB — HEMOGLOBIN A1C
HEMOGLOBIN A1C: 4.8 % (ref <5.7)
MEAN PLASMA GLUCOSE: 91 mg/dL

## 2016-02-14 MED ORDER — ASPIRIN 81 MG PO TABS: 81.0000 mg | ORAL_TABLET | Freq: Every day | ORAL | 1 refills | Status: DC

## 2016-02-14 MED ORDER — ROSUVASTATIN CALCIUM 10 MG PO TABS: 10.0000 mg | ORAL_TABLET | Freq: Every day | ORAL | 1 refills | Status: DC

## 2016-02-14 MED ORDER — VALSARTAN-HYDROCHLOROTHIAZIDE 320-25 MG PO TABS: ORAL_TABLET | ORAL | 1 refills | Status: DC

## 2016-02-14 NOTE — Progress Notes (Signed)
Name: Lisa Steele   MRN: [id number]    DOB: Jul 26, 1945   Date:02/14/2016       Progress Note  Subjective  Chief Complaint  Chief Complaint  Patient presents with  . Annual Exam  . Medication Refill    6 month F/U  . Dyslipidemia  . Hypertension  . Depression  . Hip Pain    Onset-3-4 weeks has seen Urgent Care and Orthopedics, has been given medication which makes her sick to her stomach    HPI    Functional ability/safety issues: No Issues Hearing issues: Addressed  Activities of daily living: Discussed Home safety issues: No Issues  End Of Life Planning: Offered verbal information regarding advanced directives, healthcare power of attorney.   Preventative care, Health maintenance, Preventative health measures discussed.  Preventative screenings discussed today: lab work, colonoscopy,  mammogram, DEXA.  Low Dose CT Chest recommended if Age 66-80 years, 30 pack-year currently smoking OR have quit w/in 15years.   Lifestyle risk factor issued reviewed: Diet, exercise, weight management, advised patient smoking is not healthy, nutrition/diet.  Preventative health measures discussed (5-10 year plan).  Reviewed and recommended vaccinations: - Pneumovax  - Prevnar  - Annual Influenza - Zostavax - Tdap   Depression screening: Done Fall risk screening: Done Discuss ADLs/IADLs: Done  Current medical providers: See HPI  Other health risk factors identified this visit: No other issues Cognitive impairment issues: None identified  All above discussed with patient. Appropriate education, counseling and referral will be made based upon the above.    Situational Depression: she is doing better, she is not taking any medications, she is feeling better, adjusted to having older sister here from [place] this Fall because she is blind and sisters are rotating to help her out.  Younger sister is doing well after bilateral mastectomy this past Summer.. She has no history of  depression. She denies crying spells, feeling much better now,   Obesity: she has gained weight since last visit 13 lbs in the past 6 months. She is not as physically active because of right hip and plantar fasciitis. She tries to eat a balanced diet. She is drinking plenty of water. Discussed portion control.   HTN: taking bp medication daily, she denies chest pain , SOB or palpitation.  Hyperlipidemia: taking Crestor, no myalgias, compliant with medications   Bunion right: she has a history of left bunion surgery about 8 years ago, pain has been intermittent for many years, over the past couple of months she has noticed constant pain over the right bunion.She was seen by Dr. Ether GriffinsFowler and is being monitored  Left plantar fascitis: seeing Dr. Ether GriffinsFowler and had to steroid injection and is stretching and ice.   Right hip pain: [facility]. 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 It is a constant pain, but worse with movement. It can be 10/10  Elevated glucose: family history of DM, previous hyperglycemia on labs, last hgbA1C was normal, gained weight, we will check CBC and also insulin resistance. Make sure RBC are normal and it is not falsely low.   Patient Active Problem List   Diagnosis Date Noted  . Primary osteoarthritis of both knees 06/22/2014  . Obesity (BMI 30-39.9) 06/22/2014  . Dysmetabolic syndrome 06/22/2014  . Post-menopausal 06/22/2014  . H/O cold sores 06/22/2014  . Dyslipidemia 06/22/2014  . Elevated fasting blood sugar 06/22/2014  . Benign essential HTN 06/22/2014  . Abnormal ECG 06/22/2014  Past Surgical History:  Procedure Laterality Date  . BREAST EXCISIONAL BIOPSY Left 1970  . BREAST SURGERY     cyst removed  . BUNIONECTOMY    . CATARACT EXTRACTION, BILATERAL  10/2013  . ECTOPIC PREGNANCY SURGERY    . EYE SURGERY    . TONSILECTOMY, ADENOIDECTOMY, BILATERAL MYRINGOTOMY AND TUBES      Family  History  Problem Relation Age of Onset  . Diabetes Mother   . Hypertension Mother   . Heart disease Mother   . Hyperlipidemia Mother   . Heart disease Father   . Diabetes Sister   . Heart disease Sister   . Hypertension Sister   . Hyperlipidemia Sister   . Cancer Sister [id number]    breast  . Breast cancer Maternal Aunt     Social History   Social History  . Marital status: Married    Spouse name: N/A  . Number of children: N/A  . Years of education: N/A   Occupational History  . Not on file.   Social History Main Topics  . Smoking status: Former Smoker    Years: 10.00    Start date: 03/03/1958  . Smokeless tobacco: Never Used  . Alcohol use 0.0 oz/week     Comment: socially  . Drug use: No  . Sexual activity: No   Other Topics Concern  . Not on file   Social History Narrative  . No narrative on file     Current Outpatient Prescriptions:  .  acetaminophen (TYLENOL) 500 MG tablet, Take 1 tablet by mouth 3 (three) times daily., Disp: , Rfl:  .  aspirin 81 MG tablet, Take 1 tablet by mouth daily., Disp: , Rfl:  .  etodolac (LODINE) 500 MG tablet, , Disp: , Rfl:  .  Naproxen Sodium (ALEVE) 220 MG CAPS, Take 2 capsules by mouth every 6 (six) hours as needed. , Disp: , Rfl:  .  rosuvastatin (CRESTOR) 10 MG tablet, TAKE 1 TABLET BY MOUTH  DAILY, Disp: 90 tablet, Rfl: 0 .  valsartan-hydrochlorothiazide (DIOVAN-HCT) 320-25 MG tablet, Take 1 tablet by mouth  daily, Disp: 90 tablet, Rfl: 1  Allergies  Allergen Reactions  . Penicillins Hives and Rash    leg lesions and weakness (couldn't walk)     ROS  Constitutional: Negative for fever or weight change.  Respiratory: Negative for cough and shortness of breath.   Cardiovascular: Negative for chest pain or palpitations.  Gastrointestinal: Negative for abdominal pain, no bowel changes.  Musculoskeletal: Positive for gait problem and  joint swelling.  Skin: Negative for rash.  Neurological: Negative for dizziness or  headache.  No other specific complaints in a complete review of systems (except as listed in HPI above).  Objective  Vitals:   02/14/16 0901  BP: 132/64  Pulse: 77  Resp: 16  Temp: 98 F (36.7 C)  TempSrc: Oral  SpO2: 96%  Weight: 206 lb 11.2 oz (93.8 kg)  Height: 5' 2.5" (1.588 m)    Body mass index is 37.2 kg/m.  Physical Exam  Constitutional: Patient appears well-developed and well-nourished.Obese. No distress.  HENT: Head: Normocephalic and atraumatic. Ears: B TMs ok, no erythema or effusion; Nose: Nose normal. Mouth/Throat: Oropharynx is clear and moist. No oropharyngeal exudate.  Eyes: Conjunctivae and EOM are normal. Pupils are equal, round, and reactive to light. No scleral icterus.  Neck: Normal range of motion. Neck supple. No JVD present. No thyromegaly present.  Cardiovascular: Normal rate, regular rhythm and normal heart sounds.  No  murmur heard. Trace  BLE edema. Pulmonary/Chest: Effort normal and breath sounds normal. No respiratory distress. Abdominal: Soft. Bowel sounds are normal, no distension. There is no tenderness. no masses Breast: no lumps or masses, no nipple discharge or rashes FEMALE GENITALIA:  Not done RECTAL: not done Musculoskeletal: Normal range of motion, no joint effusions. No gross deformities Neurological: he is alert and oriented to person, place, and time. No cranial nerve deficit. Coordination, balance, strength, speech and gait are normal.  Skin: Skin is warm and dry. No rash noted. No erythema. Varicose veins on both lower extremities Psychiatric: Patient has a normal mood and affect. behavior is normal. Judgment and thought content normal.  PHQ2/9: Depression screen [facility] 2/9 02/14/2016 08/08/2015 02/13/2015 08/14/2014  Decreased Interest 0 0 0 0  Down, Depressed, Hopeless 0 0 0 0  PHQ - 2 Score 0 0 0 0    Fall Risk: Fall Risk  02/14/2016 08/08/2015 02/13/2015 08/14/2014  Falls in the past year? No No No No    Functional Status  Survey: Is the patient deaf or have difficulty hearing?: No Does the patient have difficulty seeing, even when wearing glasses/contacts?: No Does the patient have difficulty concentrating, remembering, or making decisions?: No Does the patient have difficulty walking or climbing stairs?: No Does the patient have difficulty dressing or bathing?: No Does the patient have difficulty doing errands alone such as visiting a doctor's office or shopping?: No    Assessment & Plan  1. Well woman exam  Discussed importance of 150 minutes of physical activity weekly, eat two servings of fish weekly, eat one serving of tree nuts ( cashews, pistachios, pecans, almonds.Marland Kitchen) every other day, eat 6 servings of fruit/vegetables daily and drink plenty of water and avoid sweet beverages.   2. Needs flu shot  - Flu vaccine HIGH DOSE PF (Fluzone High dose)  3. Elevated fasting blood sugar  - Insulin, fasting - Hemoglobin A1c  4. Benign essential HTN  - CBC - COMPLETE METABOLIC PANEL WITH GFR - valsartan-hydrochlorothiazide (DIOVAN-HCT) 320-25 MG tablet; Take 1 tablet by mouth  daily  Dispense: 90 tablet; Refill: 1  5. Obesity (BMI 30-39.9)  Discussed with the patient the risk posed by an increased BMI. Discussed importance of portion control, calorie counting and at least 150 minutes of physical activity weekly. Avoid sweet beverages and drink more water. Eat at least 6 servings of fruit and vegetables daily   6. Chronic right hip pain  [facility]ing [facility]   7. Plantar fasciitis, left  Still having pain, seeing Dr. Ether Griffins  8. Weight gain   - TSH  9. Dyslipidemia  - Lipid panel - rosuvastatin (CRESTOR) 10 MG tablet; Take 1 tablet (10 mg total) by mouth daily.  Dispense: 90 tablet; Refill: 1  10. Varicose veins of both lower extremities with pain  She wants to hold off on referral to vascular surgeon

## 2016-02-15 LAB — LIPID PANEL
CHOLESTEROL: 173 mg/dL (ref <200)
HDL: 67 mg/dL (ref >50)
LDL Cholesterol: 87 mg/dL (ref <100)
Total CHOL/HDL Ratio: 2.6 Ratio (ref <5.0)
Triglycerides: 93 mg/dL (ref <150)
VLDL: 19 mg/dL (ref <30)

## 2016-02-15 LAB — COMPLETE METABOLIC PANEL WITH GFR
ALBUMIN: 4.6 g/dL (ref 3.6–5.1)
ALK PHOS: 59 U/L (ref 33–130)
ALT: 16 U/L (ref 6–29)
AST: 17 U/L (ref 10–35)
BUN: 10 mg/dL (ref 7–25)
CALCIUM: 10 mg/dL (ref 8.6–10.4)
CO2: 34 mmol/L — ABNORMAL HIGH (ref 20–31)
CREATININE: 0.84 mg/dL (ref 0.50–0.99)
Chloride: 102 mmol/L (ref 98–110)
GFR, EST AFRICAN AMERICAN: 82 mL/min (ref >=60)
GFR, Est Non African American: 71 mL/min (ref >=60)
Glucose, Bld: 114 mg/dL — ABNORMAL HIGH (ref 65–99)
POTASSIUM: 3.9 mmol/L (ref 3.5–5.3)
Sodium: 142 mmol/L (ref 135–146)
Total Bilirubin: 0.5 mg/dL (ref 0.2–1.2)
Total Protein: 7.8 g/dL (ref 6.1–8.1)

## 2016-02-15 LAB — TSH: TSH: 2.56 mIU/L

## 2016-02-15 LAB — INSULIN, FASTING: Insulin fasting, serum: 15.8 u[IU]/mL (ref 2.0–19.6)

## 2016-02-22 ENCOUNTER — Ambulatory Visit
Admission: RE | Admit: 2016-02-22 | Discharge: 2016-02-22 | Disposition: A | Discharge status: Home / Self Care | Payer: Medicare Other | Source: Ambulatory Visit | Attending: Orthopedic Surgery | Admitting: Orthopedic Surgery

## 2016-02-22 DIAGNOSIS — M12851 Other specific arthropathies, not elsewhere classified, right hip: Secondary | ICD-10-CM | POA: Insufficient documentation

## 2016-02-22 DIAGNOSIS — M778 Other enthesopathies, not elsewhere classified: Secondary | ICD-10-CM | POA: Insufficient documentation

## 2016-02-22 DIAGNOSIS — M25551 Pain in right hip: Secondary | ICD-10-CM | POA: Insufficient documentation

## 2016-02-22 DIAGNOSIS — G8929 Other chronic pain: Secondary | ICD-10-CM | POA: Diagnosis not present

## 2016-03-04 DIAGNOSIS — M1611 Unilateral primary osteoarthritis, right hip: Secondary | ICD-10-CM | POA: Diagnosis not present

## 2016-03-26 ENCOUNTER — Other Ambulatory Visit: Payer: Self-pay | Admitting: Family Medicine

## 2016-03-26 DIAGNOSIS — Z1231 Encounter for screening mammogram for malignant neoplasm of breast: Secondary | ICD-10-CM

## 2016-04-09 DIAGNOSIS — Z86010 Personal history of colonic polyps: Secondary | ICD-10-CM | POA: Diagnosis not present

## 2016-04-30 ENCOUNTER — Other Ambulatory Visit: Payer: Self-pay | Admitting: Obstetrics and Gynecology

## 2016-04-30 DIAGNOSIS — Z779 Other contact with and (suspected) exposures hazardous to health: Secondary | ICD-10-CM | POA: Diagnosis not present

## 2016-04-30 DIAGNOSIS — N951 Menopausal and female climacteric states: Secondary | ICD-10-CM

## 2016-05-12 ENCOUNTER — Ambulatory Visit: Payer: Medicare Other

## 2016-06-04 ENCOUNTER — Ambulatory Visit
Admission: RE | Admit: 2016-06-04 | Discharge: 2016-06-04 | Disposition: A | Discharge status: Home / Self Care | Payer: Medicare Other | Source: Ambulatory Visit | Attending: Family Medicine | Admitting: Family Medicine

## 2016-06-04 DIAGNOSIS — Z1231 Encounter for screening mammogram for malignant neoplasm of breast: Secondary | ICD-10-CM | POA: Diagnosis not present

## 2016-06-24 ENCOUNTER — Encounter: Payer: Self-pay | Admitting: *Deleted

## 2016-06-25 ENCOUNTER — Ambulatory Visit: Payer: Medicare Other | Admitting: Anesthesiology

## 2016-06-25 ENCOUNTER — Encounter
Admission: RE | Disposition: A | Discharge status: Home / Self Care | Payer: Self-pay | Source: Ambulatory Visit | Attending: Unknown Physician Specialty

## 2016-06-25 ENCOUNTER — Other Ambulatory Visit: Payer: Self-pay | Admitting: Family Medicine

## 2016-06-25 ENCOUNTER — Encounter: Payer: Self-pay | Admitting: *Deleted

## 2016-06-25 ENCOUNTER — Ambulatory Visit
Admission: RE | Admit: 2016-06-25 | Discharge: 2016-06-25 | Disposition: A | Discharge status: Home / Self Care | Payer: Medicare Other | Source: Ambulatory Visit | Attending: Unknown Physician Specialty | Admitting: Unknown Physician Specialty

## 2016-06-25 DIAGNOSIS — E785 Hyperlipidemia, unspecified: Secondary | ICD-10-CM | POA: Diagnosis not present

## 2016-06-25 DIAGNOSIS — Z955 Presence of coronary angioplasty implant and graft: Secondary | ICD-10-CM | POA: Diagnosis not present

## 2016-06-25 DIAGNOSIS — Z7982 Long term (current) use of aspirin: Secondary | ICD-10-CM | POA: Insufficient documentation

## 2016-06-25 DIAGNOSIS — Z0000 Encounter for general adult medical examination without abnormal findings: Secondary | ICD-10-CM

## 2016-06-25 DIAGNOSIS — K640 First degree hemorrhoids: Secondary | ICD-10-CM | POA: Diagnosis not present

## 2016-06-25 DIAGNOSIS — Z86010 Personal history of colonic polyps: Secondary | ICD-10-CM | POA: Diagnosis not present

## 2016-06-25 DIAGNOSIS — Z1211 Encounter for screening for malignant neoplasm of colon: Secondary | ICD-10-CM | POA: Insufficient documentation

## 2016-06-25 DIAGNOSIS — Z87891 Personal history of nicotine dependence: Secondary | ICD-10-CM | POA: Diagnosis not present

## 2016-06-25 DIAGNOSIS — Z79899 Other long term (current) drug therapy: Secondary | ICD-10-CM | POA: Insufficient documentation

## 2016-06-25 DIAGNOSIS — I10 Essential (primary) hypertension: Secondary | ICD-10-CM | POA: Insufficient documentation

## 2016-06-25 DIAGNOSIS — K648 Other hemorrhoids: Secondary | ICD-10-CM | POA: Diagnosis not present

## 2016-06-25 HISTORY — PX: COLONOSCOPY WITH PROPOFOL: SHX5780

## 2016-06-25 SURGERY — COLONOSCOPY WITH PROPOFOL
Anesthesia: General

## 2016-06-25 MED ORDER — PROPOFOL 500 MG/50ML IV EMUL
INTRAVENOUS | Status: DC | PRN
  Administered 2016-06-25: 160 ug/kg/min via INTRAVENOUS

## 2016-06-25 MED ORDER — MIDAZOLAM HCL 5 MG/5ML IJ SOLN
INTRAMUSCULAR | Status: DC | PRN
  Administered 2016-06-25: 1 mg via INTRAVENOUS

## 2016-06-25 MED ORDER — PROPOFOL 10 MG/ML IV BOLUS
INTRAVENOUS | Status: DC | PRN
  Administered 2016-06-25: 100 mg via INTRAVENOUS

## 2016-06-25 MED ORDER — MIDAZOLAM HCL 2 MG/2ML IJ SOLN
INTRAMUSCULAR | Status: AC
  Filled 2016-06-25: qty 2

## 2016-06-25 MED ORDER — FENTANYL CITRATE (PF) 100 MCG/2ML IJ SOLN
INTRAMUSCULAR | Status: DC | PRN
  Administered 2016-06-25: 50 ug via INTRAVENOUS

## 2016-06-25 MED ORDER — LIDOCAINE 2% (20 MG/ML) 5 ML SYRINGE
INTRAMUSCULAR | Status: DC | PRN
  Administered 2016-06-25: 40 mg via INTRAVENOUS

## 2016-06-25 MED ORDER — SODIUM CHLORIDE 0.9 % IV SOLN: INTRAVENOUS | Status: DC

## 2016-06-25 MED ORDER — SODIUM CHLORIDE 0.9 % IV SOLN
INTRAVENOUS | Status: DC
  Administered 2016-06-25: 1000 mL via INTRAVENOUS
  Administered 2016-06-25: 11:00:00 via INTRAVENOUS

## 2016-06-25 MED ORDER — FENTANYL CITRATE (PF) 100 MCG/2ML IJ SOLN
INTRAMUSCULAR | Status: AC
  Filled 2016-06-25: qty 2

## 2016-06-25 MED ORDER — PHENYLEPHRINE HCL 10 MG/ML IJ SOLN
INTRAMUSCULAR | Status: DC | PRN
  Administered 2016-06-25: 100 ug via INTRAVENOUS

## 2016-06-25 MED ORDER — PROPOFOL 500 MG/50ML IV EMUL
INTRAVENOUS | Status: AC
  Filled 2016-06-25: qty 50

## 2016-06-25 NOTE — Anesthesia Preprocedure Evaluation (Addendum)
Anesthesia Evaluation  Patient identified by MRN, date of birth, ID band Patient awake    Reviewed: Allergy & Precautions, H&P , NPO status , Patient's Chart, lab work & pertinent test results, reviewed documented beta blocker date and time   History of Anesthesia Complications Negative for: history of anesthetic complications  Airway Mallampati: I  TM Distance: >3 FB Neck ROM: full    Dental  (+) Caps, Missing, Teeth Intact Permanent bridge:   Pulmonary neg pulmonary ROS, former smoker,           Cardiovascular Exercise Tolerance: Good hypertension, (-) angina(-) CAD, (-) Past MI, (-) Cardiac Stents and (-) CABG (-) dysrhythmias + Valvular Problems/Murmurs      Neuro/Psych negative neurological ROS  negative psych ROS   GI/Hepatic negative GI ROS, Neg liver ROS,   Endo/Other  negative endocrine ROS  Renal/GU negative Renal ROS  negative genitourinary   Musculoskeletal   Abdominal   Peds  Hematology negative hematology ROS (+)   Anesthesia Other Findings Past Medical History: No date: Cataracts, bilateral No date: Hyperlipidemia No date: Hypertension   Reproductive/Obstetrics negative OB ROS                            Anesthesia Physical Anesthesia Plan  ASA: II  Anesthesia Plan: General   Post-op Pain Management:    Induction:   Airway Management Planned:   Additional Equipment:   Intra-op Plan:   Post-operative Plan:   Informed Consent: I have reviewed the patients History and Physical, chart, labs and discussed the procedure including the risks, benefits and alternatives for the proposed anesthesia with the patient or authorized representative who has indicated his/her understanding and acceptance.   Dental Advisory Given  Plan Discussed with: Anesthesiologist, CRNA and Surgeon  Anesthesia Plan Comments:        Anesthesia Quick Evaluation

## 2016-06-25 NOTE — H&P (Signed)
Primary Care Physician:  Ruel Favors, MD Primary Gastroenterologist:  Dr. Mechele Collin  Pre-Procedure History & Physical: HPI:  Lisa Steele is a 71 y.o. female is here for an colonoscopy.   Past Medical History:  Diagnosis Date  . Cataracts, bilateral   . Hyperlipidemia   . Hypertension     Past Surgical History:  Procedure Laterality Date  . BREAST EXCISIONAL BIOPSY Left 1970  . BREAST SURGERY     cyst removed  . BUNIONECTOMY    . CATARACT EXTRACTION, BILATERAL  10/2013  . ECTOPIC PREGNANCY SURGERY    . EYE SURGERY    . TONSILECTOMY, ADENOIDECTOMY, BILATERAL MYRINGOTOMY AND TUBES    . TONSILLECTOMY      Prior to Admission medications   Medication Sig Start Date End Date Taking? Authorizing Provider  acetaminophen (TYLENOL) 500 MG tablet Take 1 tablet by mouth 3 (three) times daily. 07/26/13  Yes Historical Provider, MD  aspirin 81 MG tablet Take 1 tablet (81 mg total) by mouth daily. 02/14/16  Yes [name], MD  etodolac (LODINE) 500 MG tablet  01/28/16  Yes Historical Provider, MD  Naproxen Sodium (ALEVE) 220 MG CAPS Take 2 capsules by mouth every 6 (six) hours as needed.    Yes Historical Provider, MD  rosuvastatin (CRESTOR) 10 MG tablet Take 1 tablet (10 mg total) by mouth daily. 02/14/16  Yes [name], MD  valsartan-hydrochlorothiazide (DIOVAN-HCT) 320-25 MG tablet Take 1 tablet by mouth  daily 02/14/16  Yes [name], MD    Allergies as of 04/25/2016 - Review Complete 02/14/2016  Allergen Reaction Noted  . Penicillins Hives and Rash 06/22/2014    Family History  Problem Relation Age of Onset  . Diabetes Mother   . Hypertension Mother   . Heart disease Mother   . Hyperlipidemia Mother   . Heart disease Father   . Diabetes Sister   . Heart disease Sister   . Hypertension Sister   . Hyperlipidemia Sister   . Cancer Sister 46    breast  . Breast cancer Maternal Aunt     Social History   Social History  . Marital status: Married   Spouse name: N/A  . Number of children: N/A  . Years of education: N/A   Occupational History  . Not on file.   Social History Main Topics  . Smoking status: Former Smoker    Years: 10.00    Start date: 03/03/1958  . Smokeless tobacco: Never Used  . Alcohol use 0.0 oz/week     Comment: socially  . Drug use: No  . Sexual activity: No   Other Topics Concern  . Not on file   Social History Narrative  . No narrative on file    Review of Systems: See HPI, otherwise negative ROS  Physical Exam: BP (!) 154/68   Temp 97.9 F (36.6 C) (Tympanic)   Resp 18   Ht  (1.6 m)   Wt 84.8 kg (187 lb)   SpO2 99%   BMI 33.13 kg/m  General:   Alert,  pleasant and cooperative in NAD Head:  Normocephalic and atraumatic. Neck:  Supple; no masses or thyromegaly. Lungs:  Clear throughout to auscultation.    Heart:  Regular rate and rhythm. Abdomen:  Soft, nontender and nondistended. Normal bowel sounds, without guarding, and without rebound.   Neurologic:  Alert and  oriented x4;  grossly normal neurologically.  Impression/Plan: Lisa Steele is here for an colonoscopy to be performed for [facility] colon polyps  Risks, benefits, limitations, and alternatives regarding  colonoscopy have been reviewed with the patient.  Questions have been answered.  All parties agreeable.   Lynnae Prude, MD  06/25/2016, 10:50 AM

## 2016-06-25 NOTE — Anesthesia Post-op Follow-up Note (Cosign Needed)
Anesthesia QCDR form completed.        

## 2016-06-25 NOTE — Op Note (Signed)
[facility] Patient Name: Lisa Steele Procedure Date: 06/25/2016 10:58 AM MRN: [id number] Account #: [id number] Date of Birth: [birth year] Admit Type: Outpatient Age: 72 Room: [facility] ENDO ROOM 4 Gender: Female Note Status: Finalized Procedure:            Colonoscopy Indications:          High risk colon cancer surveillance: Personal history                        of colonic polyps Providers:            Scot Jun, MD Referring MD:         Onnie Boer. Sowles, MD (Referring MD) Medicines:            Propofol per Anesthesia Complications:        No immediate complications. Procedure:            Pre-Anesthesia Assessment:                       - After reviewing the risks and benefits, the patient                        was deemed in satisfactory condition to undergo the                        procedure.                       After obtaining informed consent, the colonoscope was                        passed under direct vision. Throughout the procedure,                        the patient's blood pressure, pulse, and oxygen                        saturations were monitored continuously. The                        Colonoscope was introduced through the anus and                        advanced to the the cecum, identified by appendiceal                        orifice and ileocecal valve. The colonoscopy was                        performed without difficulty. The patient tolerated the                        procedure well. The quality of the bowel preparation                        was excellent. Findings:      Internal hemorrhoids were found during endoscopy. The hemorrhoids were       small and Grade I (internal hemorrhoids that do not prolapse).      The exam was otherwise without abnormality. Impression:           -  Internal hemorrhoids.                       - The examination was otherwise normal.                       - No specimens  collected. Recommendation:       - Repeat colonoscopy in 5 years for surveillance. Scot Jun, MD 06/25/2016 11:24:31 AM This report has been signed electronically. Number of Addenda: 0 Note Initiated On: 06/25/2016 10:58 AM Scope Withdrawal Time: 0 hours 8 minutes 12 seconds  Total Procedure Duration: 0 hours 11 minutes 42 seconds       [facility]

## 2016-06-25 NOTE — Anesthesia Postprocedure Evaluation (Signed)
Anesthesia Post Note  Patient: Lisa Steele  Procedure(s) Performed: Procedure(s) (LRB): COLONOSCOPY WITH PROPOFOL (N/A)  Patient location during evaluation: Endoscopy Anesthesia Type: General Level of consciousness: awake and alert Pain management: pain level controlled Vital Signs Assessment: post-procedure vital signs reviewed and stable Respiratory status: spontaneous breathing, nonlabored ventilation, respiratory function stable and patient connected to nasal cannula oxygen Cardiovascular status: blood pressure returned to baseline and stable Postop Assessment: no signs of nausea or vomiting Anesthetic complications: no     Last Vitals:  Vitals:   06/25/16 1148 06/25/16 1158  BP: 130/63   Pulse: 64 60  Resp: 15 13  Temp:      Last Pain:  Vitals:   06/25/16 1128  TempSrc: Tympanic                 Lenard Simmer

## 2016-06-25 NOTE — Transfer of Care (Signed)
Immediate Anesthesia Transfer of Care Note  Patient: Lisa Steele  Procedure(s) Performed: Procedure(s): COLONOSCOPY WITH PROPOFOL (N/A)  Patient Location: PACU and Endoscopy Unit  Anesthesia Type:General  Level of Consciousness: awake and patient cooperative  Airway & Oxygen Therapy: Patient Spontanous Breathing and Patient connected to nasal cannula oxygen  Post-op Assessment: Report given to RN and Post -op Vital signs reviewed and stable  Post vital signs: Reviewed and stable  Last Vitals:  Vitals:   06/25/16 0954  BP: (!) 154/68  Resp: 18  Temp: 36.6 C    Last Pain:  Vitals:   06/25/16 0954  TempSrc: Tympanic         Complications: No apparent anesthesia complications

## 2016-06-26 ENCOUNTER — Encounter: Payer: Self-pay | Admitting: Unknown Physician Specialty

## 2016-06-26 DIAGNOSIS — H35372 Puckering of macula, left eye: Secondary | ICD-10-CM | POA: Diagnosis not present

## 2016-07-17 DIAGNOSIS — H34831 Tributary (branch) retinal vein occlusion, right eye, with macular edema: Secondary | ICD-10-CM | POA: Diagnosis not present

## 2016-07-17 DIAGNOSIS — H35033 Hypertensive retinopathy, bilateral: Secondary | ICD-10-CM | POA: Diagnosis not present

## 2016-07-17 DIAGNOSIS — H3581 Retinal edema: Secondary | ICD-10-CM | POA: Diagnosis not present

## 2016-07-17 DIAGNOSIS — H35372 Puckering of macula, left eye: Secondary | ICD-10-CM | POA: Diagnosis not present

## 2016-07-29 DIAGNOSIS — L03114 Cellulitis of left upper limb: Secondary | ICD-10-CM | POA: Diagnosis not present

## 2016-08-01 DIAGNOSIS — L03114 Cellulitis of left upper limb: Secondary | ICD-10-CM | POA: Diagnosis not present

## 2016-08-15 ENCOUNTER — Encounter: Payer: Self-pay | Admitting: Family Medicine

## 2016-08-15 ENCOUNTER — Ambulatory Visit (INDEPENDENT_AMBULATORY_CARE_PROVIDER_SITE_OTHER): Payer: Medicare Other | Admitting: Family Medicine

## 2016-08-15 VITALS — BP 132/64 | HR 74 | Temp 97.7°F | Resp 16 | Ht 63.0 in | Wt 197.4 lb

## 2016-08-15 DIAGNOSIS — G8929 Other chronic pain: Secondary | ICD-10-CM

## 2016-08-15 DIAGNOSIS — E785 Hyperlipidemia, unspecified: Secondary | ICD-10-CM

## 2016-08-15 DIAGNOSIS — I10 Essential (primary) hypertension: Secondary | ICD-10-CM | POA: Diagnosis not present

## 2016-08-15 DIAGNOSIS — L03119 Cellulitis of unspecified part of limb: Secondary | ICD-10-CM

## 2016-08-15 DIAGNOSIS — R7301 Impaired fasting glucose: Secondary | ICD-10-CM | POA: Diagnosis not present

## 2016-08-15 DIAGNOSIS — M25551 Pain in right hip: Secondary | ICD-10-CM

## 2016-08-15 DIAGNOSIS — E669 Obesity, unspecified: Secondary | ICD-10-CM

## 2016-08-15 MED ORDER — ROSUVASTATIN CALCIUM 10 MG PO TABS: 10.0000 mg | ORAL_TABLET | Freq: Every day | ORAL | 1 refills | Status: DC

## 2016-08-15 MED ORDER — VALSARTAN-HYDROCHLOROTHIAZIDE 320-25 MG PO TABS: ORAL_TABLET | ORAL | 1 refills | Status: DC

## 2016-08-15 NOTE — Patient Instructions (Signed)
Semimembranosus Tendinitis Rehab Ask your health care provider which exercises are safe for you. Do exercises exactly as told by your health care provider and adjust them as directed. It is normal to feel mild stretching, pulling, tightness, or discomfort as you do these exercises, but you should stop right away if you feel sudden pain or your pain gets worse.Do not begin these exercises until told by your health care provider. Stretching and range of motion exercises These exercises warm up your muscles and joints and improve the movement and flexibility of your thigh. These exercises also help to relieve pain, numbness, and tingling. Exercise A: Hamstring stretch, supine  1. Lie on your back. Loop a belt or towel across the ball of your left / right foot The ball of your foot is on the walking surface, right under your toes. 2. Straighten your left / right knee and slowly pull on the belt to raise your leg. Stop when you feel a gentle stretch behind your left / right knee or thigh. ? Do not allow the knee to bend. ? Keep your other leg flat on the floor. 3. Hold this position for __________ seconds. Repeat __________ times. Complete this exercise __________ times a day. Strengthening exercises These exercises build strength and endurance in your thigh. Endurance is the ability to use your muscles for a long time, even after they get tired. Exercise B: Straight leg raises ( hip extensors) 1. Lie on your belly on a bed or a firm surface with a pillow under your hips. 2. Bend your left / right knee so your foot is straight up in the air. 3. Squeeze your buttock muscles and lift your left / right thigh off the bed. Do not let your back arch. 4. Hold this position for __________seconds. 5. Slowly return to the starting position. Let your muscles relax completely before you do another repetition. Repeat __________ times. Complete this exercise __________ times a day. Exercise C: Bridge ( hip  extensors) 1. Lie on your back on a firm surface with your knees bent and your feet flat on the floor. 2. Tighten your buttocks muscles and lift your bottom off the floor until your trunk is level with your thighs. ? You should feel the muscles working in your buttocks and the back of your thighs. If you do not feel these muscles, slide your feet 1-2 inches (2.5-5 cm) farther away from your buttocks. ? Do not arch your back. 3. Hold this position for __________ seconds. 4. Slowly lower your hips to the starting position. 5. Let your buttocks muscles relax completely between repetitions. If this exercise is too easy, try doing it with your arms crossed over your chest. Repeat __________ times. Complete this exercise __________ times a day. Exercise D: Hamstring eccentric, prone 1. Lie on your belly on a bed or on the floor. 2. Start with your legs straight. Cross your legs at the ankles with your left / right leg on top. 3. Using your bottom leg to do the work, bend both knees. 4. Using just your left / right leg alone, slowly lower your leg back down toward the bed. Add a __________ weight as told by your health care provider. 5. Let your muscles relax completely between repetitions. Repeat __________ times. Complete this exercise __________ times a day. Exercise E: Squats 1. Stand in front of a table, with your feet and knees pointing straight ahead. You may rest your hands on the table for balance but not for   support. 2. Slowly bend your knees and lower your hips like you are going to sit in a chair. Keep your thighs straight or pointed slightly outward. ? Keep your weight over your heels, not over your toes. ? Keep your lower legs upright so they are parallel with the table legs. ? Do not let your hips go lower than your knees. Stop when your knees are bent to the shape of an upside-down letter "L" (90 degree angle). ? Do not bend lower than told by your health care provider. ? If your  knee pain increases, do not bend as low. 3. Hold the squat position __________ seconds. 4. Slowly push with your legs to return to standing. Do not use your hands to pull yourself to standing. Repeat __________ times. Complete this exercise __________ times a day. This information is not intended to replace advice given to you by your health care provider. Make sure you discuss any questions you have with your health care provider. Document Released: 02/17/2005 Document Revised: 10/25/2015 Document Reviewed: 11/21/2014 [employer] Interactive Patient Education  2018 [employer]  

## 2016-08-15 NOTE — Progress Notes (Signed)
Name: Lisa Steele   MRN: [id number]    DOB: February 04, 1946   Date:08/15/2016       Progress Note  Subjective  Chief Complaint  Chief Complaint  Patient presents with  . Hypertension    6 month follow up  . Hyperlipidemia    HPI  Obesity: she has lost 9 lbs since last visit. She is  Exercising again since hip is no longer causing problems, and plantar fascitis also resolved when she switched shoes.She tries to eat a balanced diet   HTN: taking bp medication daily, she denies chest pain , SOB or palpitation.  Hyperlipidemia: taking Crestor, no myalgias, compliant with medications. Last labs reviewed and it was at goal.  Left wrist problem: she developed stinging and swelling of left wrist and was seen at Urgent Care, she was given antibiotics and told she had cellulitis, swelling still present but no longer has pain or stinging. No redness. She developed itching with doxy, but able to tolerate Omnicef  Right hip pain: seeing PA [facility] She had MRI, showed OA, she is doing better, at times gets stiff when she gets up, but otherwise no problems. Off Lodine. She complains of tight hamstrings and we will give her a list of exercises.  Elevated glucose: family history of DM, previous hyperglycemia on labs, last hgbA1C was normal, but elevated fasting insulin, lost weight since last visit.   Patient Active Problem List   Diagnosis Date Noted  . Primary osteoarthritis of both knees 06/22/2014  . Obesity (BMI 30-39.9) 06/22/2014  . Dysmetabolic syndrome 06/22/2014  . Post-menopausal 06/22/2014  . H/O cold sores 06/22/2014  . Dyslipidemia 06/22/2014  . Elevated fasting blood sugar 06/22/2014  . Benign essential HTN 06/22/2014  . Abnormal ECG 06/22/2014    Past Surgical History:  Procedure Laterality Date  . BREAST EXCISIONAL BIOPSY Left 1970  . BREAST SURGERY     cyst removed  . BUNIONECTOMY    . CATARACT EXTRACTION, BILATERAL  10/2013  . COLONOSCOPY WITH  PROPOFOL N/A 06/25/2016   Procedure: COLONOSCOPY WITH PROPOFOL;  Surgeon: Scot Jun, MD;  Location: [facility] ENDOSCOPY;  Service: Endoscopy;  Laterality: N/A;  . ECTOPIC PREGNANCY SURGERY    . EYE SURGERY    . TONSILECTOMY, ADENOIDECTOMY, BILATERAL MYRINGOTOMY AND TUBES    . TONSILLECTOMY      Family History  Problem Relation Age of Onset  . Diabetes Mother   . Hypertension Mother   . Heart disease Mother   . Hyperlipidemia Mother   . Heart disease Father   . Diabetes Sister   . Heart disease Sister   . Hypertension Sister   . Hyperlipidemia Sister   . Cancer Sister 68       breast  . Breast cancer Maternal Aunt     Social History   Social History  . Marital status: Married    Spouse name: N/A  . Number of children: N/A  . Years of education: N/A   Occupational History  . Not on file.   Social History Main Topics  . Smoking status: Former Smoker    Years: 10.00    Start date: 03/03/1958  . Smokeless tobacco: Never Used  . Alcohol use 0.0 oz/week     Comment: socially  . Drug use: No  . Sexual activity: No   Other Topics Concern  . Not on file   Social History Narrative  . No narrative on file     Current Outpatient Prescriptions:  .  triamcinolone cream (KENALOG) 0.1 %, Apply topically., Disp: , Rfl:  .  acetaminophen (TYLENOL) 500 MG tablet, Take 1 tablet by mouth 3 (three) times daily., Disp: , Rfl:  .  ASPIRIN LOW DOSE 81 MG chewable tablet, CHEW AND SWALLOW 1 TABLET  DAILY, Disp: 90 tablet, Rfl: 3 .  etodolac (LODINE) 500 MG tablet, , Disp: , Rfl:  .  Naproxen Sodium (ALEVE) 220 MG CAPS, Take 2 capsules by mouth every 6 (six) hours as needed. , Disp: , Rfl:  .  rosuvastatin (CRESTOR) 10 MG tablet, Take 1 tablet (10 mg total) by mouth daily., Disp: 90 tablet, Rfl: 1 .  valsartan-hydrochlorothiazide (DIOVAN-HCT) 320-25 MG tablet, Take 1 tablet by mouth  daily, Disp: 90 tablet, Rfl: 1  Allergies  Allergen Reactions  . Doxycycline Itching  .  Penicillins Hives and Rash    leg lesions and weakness (couldn't walk)     ROS  Constitutional: Negative for fever, positive for  weight change.  Respiratory: Negative for cough and shortness of breath.   Cardiovascular: Negative for chest pain or palpitations.  Gastrointestinal: Negative for abdominal pain, no bowel changes.  Musculoskeletal: Negative for gait problem or joint swelling.  Skin: Negative for rash.  Neurological: Negative for dizziness or headache.  No other specific complaints in a complete review of systems (except as listed in HPI above).  Objective  Vitals:   08/15/16 0810  BP: 132/64  Pulse: 74  Resp: 16  Temp: 97.7 F (36.5 C)  SpO2: 96%  Weight: 197 lb 6 oz (89.5 kg)  Height: 5\' 3"  (1.6 m)    Body mass index is 34.96 kg/m.  Physical Exam  Constitutional: Patient appears well-developed and well-nourished. Obese No distress.  HEENT: head atraumatic, normocephalic, pupils equal and reactive to light,  neck supple, throat within normal limits Cardiovascular: Normal rate, regular rhythm and normal heart sounds.  No murmur heard. No BLE edema. Pulmonary/Chest: Effort normal and breath sounds normal. No respiratory distress. Abdominal: Soft.  There is no tenderness. Psychiatric: Patient has a normal mood and affect. behavior is normal. Judgment and thought content normal. Muscular Skeletal: left wrist is puffy dorsally, possible lipoma? , decrease rom of both hips, crepitus with extension of both knees  PHQ2/9: [facility] screen [facility] 2/9 02/14/2016 08/08/2015 02/13/2015 08/14/2014  Decreased Interest 0 0 0 0  Down, Depressed, Hopeless 0 0 0 0  PHQ - 2 Score 0 0 0 0     Fall Risk: Fall Risk  02/14/2016 08/08/2015 02/13/2015 08/14/2014  Falls in the past year? No No No No    Assessment & Plan  1. Elevated fasting blood sugar  - Insulin, fasting  2. Benign essential HTN  - valsartan-hydrochlorothiazide (DIOVAN-HCT) 320-25 MG tablet; Take 1 tablet  by mouth  daily  Dispense: 90 tablet; Refill: 1  3. Obesity (BMI 30-39.9)  Discussed with the patient the risk posed by an increased BMI. Discussed importance of portion control, calorie counting and at least 150 minutes of physical activity weekly. Avoid sweet beverages and drink more water. Eat at least 6 servings of fruit and vegetables daily   4. Chronic right hip pain  Doing better  5. Dyslipidemia  - rosuvastatin (CRESTOR) 10 MG tablet; Take 1 tablet (10 mg total) by mouth daily.  Dispense: 90 tablet; Refill: 1  6. Cellulitis of wrist  Resolved pain, still has puffiness, call me if she would like to see surgeon

## 2016-08-18 LAB — INSULIN, FASTING: Insulin fasting, serum: 14.7 u[IU]/mL (ref 2.0–19.6)

## 2016-09-24 ENCOUNTER — Other Ambulatory Visit: Payer: Self-pay | Admitting: Family Medicine

## 2016-09-24 ENCOUNTER — Telehealth: Payer: Self-pay | Admitting: Family Medicine

## 2016-09-24 MED ORDER — OLMESARTAN MEDOXOMIL-HCTZ 40-25 MG PO TABS: 1.0000 | ORAL_TABLET | Freq: Every day | ORAL | 1 refills | Status: DC

## 2016-09-24 NOTE — Telephone Encounter (Signed)
Changed to Benicar hctz

## 2016-09-24 NOTE — Telephone Encounter (Signed)
Pt is needing a call back about her medication has a recall on it. Please call and let her know what you are changing her to

## 2016-10-27 DIAGNOSIS — M25562 Pain in left knee: Secondary | ICD-10-CM | POA: Diagnosis not present

## 2016-10-27 DIAGNOSIS — M170 Bilateral primary osteoarthritis of knee: Secondary | ICD-10-CM | POA: Diagnosis not present

## 2016-10-27 DIAGNOSIS — M25561 Pain in right knee: Secondary | ICD-10-CM | POA: Diagnosis not present

## 2016-10-27 DIAGNOSIS — M1611 Unilateral primary osteoarthritis, right hip: Secondary | ICD-10-CM | POA: Diagnosis not present

## 2016-11-12 DIAGNOSIS — Z23 Encounter for immunization: Secondary | ICD-10-CM | POA: Diagnosis not present

## 2017-01-27 ENCOUNTER — Other Ambulatory Visit: Payer: Self-pay | Admitting: Family Medicine

## 2017-01-27 DIAGNOSIS — E785 Hyperlipidemia, unspecified: Secondary | ICD-10-CM

## 2017-01-27 NOTE — Telephone Encounter (Signed)
Hypertension medication request: Benicar to [facility].  Last office visit pertaining to hypertension: 08/15/2016  BP Readings from Last 3 Encounters:  08/15/16 132/64  06/25/16 130/63  02/14/16 132/64    Lab Results  Component Value Date   CREATININE 0.84 02/14/2016   BUN 10 02/14/2016   NA 142 02/14/2016   K 3.9 02/14/2016   CL 102 02/14/2016   CO2 34 (H) 02/14/2016    Next visit: 02/19/2017  Refill Request for Cholesterol medication: Crestor  Lab Results  Component Value Date   CHOL 173 02/14/2016   HDL 67 02/14/2016   LDLCALC 87 02/14/2016   TRIG 93 02/14/2016   CHOLHDL 2.6 02/14/2016

## 2017-02-18 DIAGNOSIS — H3561 Retinal hemorrhage, right eye: Secondary | ICD-10-CM | POA: Diagnosis not present

## 2017-02-18 DIAGNOSIS — H35372 Puckering of macula, left eye: Secondary | ICD-10-CM | POA: Diagnosis not present

## 2017-02-19 ENCOUNTER — Encounter: Payer: Self-pay | Admitting: Family Medicine

## 2017-02-19 ENCOUNTER — Ambulatory Visit (INDEPENDENT_AMBULATORY_CARE_PROVIDER_SITE_OTHER): Payer: Medicare Other | Admitting: Family Medicine

## 2017-02-19 VITALS — BP 124/70 | HR 84 | Temp 97.7°F | Resp 14 | Ht 62.6 in | Wt 193.7 lb

## 2017-02-19 DIAGNOSIS — I10 Essential (primary) hypertension: Secondary | ICD-10-CM

## 2017-02-19 DIAGNOSIS — Z974 Presence of external hearing-aid: Secondary | ICD-10-CM

## 2017-02-19 DIAGNOSIS — M25551 Pain in right hip: Secondary | ICD-10-CM

## 2017-02-19 DIAGNOSIS — E669 Obesity, unspecified: Secondary | ICD-10-CM | POA: Insufficient documentation

## 2017-02-19 DIAGNOSIS — Z0000 Encounter for general adult medical examination without abnormal findings: Secondary | ICD-10-CM | POA: Diagnosis not present

## 2017-02-19 DIAGNOSIS — G8929 Other chronic pain: Secondary | ICD-10-CM | POA: Diagnosis not present

## 2017-02-19 DIAGNOSIS — Z6834 Body mass index (BMI) 34.0-34.9, adult: Secondary | ICD-10-CM | POA: Diagnosis not present

## 2017-02-19 DIAGNOSIS — E2839 Other primary ovarian failure: Secondary | ICD-10-CM

## 2017-02-19 DIAGNOSIS — E785 Hyperlipidemia, unspecified: Secondary | ICD-10-CM

## 2017-02-19 DIAGNOSIS — R7301 Impaired fasting glucose: Secondary | ICD-10-CM | POA: Diagnosis not present

## 2017-02-19 MED ORDER — ROSUVASTATIN CALCIUM 10 MG PO TABS: 10.0000 mg | ORAL_TABLET | Freq: Every day | ORAL | 1 refills | Status: DC

## 2017-02-19 MED ORDER — OLMESARTAN MEDOXOMIL-HCTZ 40-25 MG PO TABS: 1.0000 | ORAL_TABLET | Freq: Every day | ORAL | 1 refills | Status: DC

## 2017-02-19 NOTE — Progress Notes (Signed)
Patient: Lisa Steele, Female    DOB: 1945/09/28, 71 y.o.   MRN: [id number]  Visit Date: 02/19/2017  Today's Provider: Ruel FavorsKrichna F Aneliese Beaudry, MD   Chief Complaint  Patient presents with  . Medicare Wellness  . Hypertension  . Follow-up    Subjective:    HPI Lisa Steele is a 71 y.o. female who presents today for her Subsequent Annual Wellness Visit.  Patient/Caregiver input:    Obesity: she has lost 13 lbs over the past year, she is able to be active again, right hip is no longer bothering her as much. She eats healthy   HTN: taking bp medication daily, she denies chest pain , SOB or palpitation. Denies dizziness  Hyperlipidemia: taking Crestor, no myalgias, compliant with medications. Recheck labs  Right hip pain: seeing PA Lenard ForthMundy and Dr. Rosita KeaMenz [facility]. She had MRI, showed OA, she is doing better, at times gets stiff when she gets up, but otherwise no problems. Off Lodine taking Meloxicam prn during a flare. She complains of tight hamstrings and we will give her a list of exercises.  Elevated glucose: family history of DM, previous hyperglycemia on labs, last hgbA1C was normal, but elevated fasting insulin, lost weight since last visit. We will recheck labs  Review of Systems  Constitutional: Negative for fever , positive weight change.  Respiratory: Negative for cough and shortness of breath.   Cardiovascular: Negative for chest pain or palpitations.  Gastrointestinal: Negative for abdominal pain, no bowel changes.  Musculoskeletal: Negative for gait problem or joint swelling. She has intermittent right hip pain and it causes her to limp  Skin: Negative for rash.  Neurological: Negative for dizziness or headache.  No other specific complaints in a complete review of systems (except as listed in HPI above).  Past Medical History:  Diagnosis Date  . Cataracts, bilateral   . Hyperlipidemia   . Hypertension     Past Surgical History:  Procedure Laterality Date   . BREAST EXCISIONAL BIOPSY Left 1970  . BREAST SURGERY     cyst removed  . BUNIONECTOMY    . CATARACT EXTRACTION, BILATERAL  10/2013  . COLONOSCOPY WITH PROPOFOL N/A 06/25/2016   Procedure: COLONOSCOPY WITH PROPOFOL;  Surgeon: Scot Junobert T Elliott, MD;  [facility] END[facility];  Service: Endoscopy;  Laterality: N/A;  . ECTOPIC PREGNANCY SURGERY    . EYE SURGERY    . TONSILECTOMY, ADENOIDECTOMY, BILATERAL MYRINGOTOMY AND TUBES    . TONSILLECTOMY      Family History  Problem Relation Age of Onset  . Diabetes Mother   . Hypertension Mother   . Heart disease Mother   . Hyperlipidemia Mother   . Heart disease Father   . Diabetes Sister   . Heart disease Sister   . Hypertension Sister   . Hyperlipidemia Sister   . Cancer Sister [id number]       breast  . Breast cancer Maternal Aunt   . Diabetes Sister   . Diabetes Sister   . Diabetes Sister     Social History   Socioeconomic History  . Marital status: Married    Spouse name: Chales Abrahamsyson   . Number of children: 1  . Years of education: Not on file  . Highest education level: Associate degree: academic program  Social Needs  . Financial resource strain: Not hard at all  . Food insecurity - worry: Never true  . Food insecurity - inability: Never true  . Transportation needs - medical: No  . Transportation needs - non-medical:  No  Occupational History  . Occupation: Theatre stage manager     Comment: [employer]   Tobacco Use  . Smoking status: Former Smoker    Years: 10.00    Start date: 03/03/1958  . Smokeless tobacco: Never Used  Substance and Sexual Activity  . Alcohol use: Yes    Alcohol/week: 0.0 oz    Comment: socially  . Drug use: No  . Sexual activity: No    Comment: husband has ED  Other Topics Concern  . Not on file  Social History Narrative   Married, son lives in [place] and has 3 grandchildren , youngest born 10/2016    Outpatient Encounter Medications as of 02/19/2017  Medication Sig Note  .  acetaminophen (TYLENOL) 500 MG tablet Take 1 tablet by mouth 3 (three) times daily. 08/13/2014: Received from: [employer] Received Sig: Take by mouth.  . ASPIRIN LOW DOSE 81 MG chewable tablet CHEW AND SWALLOW 1 TABLET  DAILY   . olmesartan-hydrochlorothiazide (BENICAR HCT) 40-25 MG tablet TAKE 1 TABLET BY MOUTH  DAILY   . rosuvastatin (CRESTOR) 10 MG tablet TAKE 1 TABLET BY MOUTH  DAILY   . triamcinolone cream (KENALOG) 0.1 % Apply topically.   . meloxicam (MOBIC) 7.5 MG tablet Take 1 tablet by mouth 2 (two) times daily.    No facility-administered encounter medications on file as of 02/19/2017.     Allergies  Allergen Reactions  . Doxycycline Itching  . Penicillins Hives and Rash    leg lesions and weakness (couldn't walk)    Care Team Updated in EHR: Yes  Last Vision Exam: 02/18/2017  Wears corrective lenses: Yes - [facility] Last Dental Exam: 10/29/2016 Dr. Roda Shutters  Last Hearing Exam:  Wears Hearing Aids: Yes  Functional Ability / Safety Screening 1.  Was the timed Get Up and Go test shorter than 30 seconds?  yes 2.  Does the patient need help with the phone, transportation, shopping,      preparing meals, housework, laundry, medications, or managing money?  no 3.  Is the patient's home free of loose throw rugs in walkways, pet beds, electrical cords, etc?   no      Grab bars in the bathroom? no      Handrails on the stairs?   yes      Adequate lighting?   yes 4.  Has the patient noticed any hearing difficulties?   no  Diet Recall and Exercise Regimen:   Current Exercise Habits: Structured exercise class, Time (Minutes): 40, Frequency (Times/Week): 3, Weekly Exercise (Minutes/Week): 120 Exercise limited by: None identified   Eating a balanced diet, likes fruit and vegetables, eat fish on a regular basis   Advanced Care Planning: A voluntary discussion about advance care planning including the explanation and discussion of advance directives.     Patient does have a living will at present time. If patient does have living will, I have requested they bring this to the clinic to be scanned in to their chart. Does patient have a HCPOA?    yes If yes, name and contact information:  Does patient have a living will or MOST form?  yes  Cancer Screenings:  Lung: Low Dose CT Chest recommended if Age 20-80 years, 30 pack-year currently smoking OR have quit w/in 15years. Patient does not qualify. Breast:  Up to date on Mammogram? Yes  Up to date of Bone Density/Dexa? No Colon: up to date 06/2016  Additional Screenings: Hepatitis B/HIV/Syphillis: not interested  Hepatitis C Screening: up to date  Intimate Partner Violence: negative screen    Denies bladder problems - but she has chronic nocturia - 2 times, drinks water right before bed time   Objective:   Vitals: BP 124/70 (BP Location: Left Arm, Patient Position: Sitting, Cuff Size: Normal)   Pulse 84   Temp 97.7 F (36.5 C) (Oral)   Resp 14   Ht 5' 2.6" (1.59 m)   Wt 193 lb 11.2 oz (87.9 kg)   SpO2 98%   BMI 34.75 kg/m  Body mass index is 34.75 kg/m.  No exam data present  Physical Exam Constitutional: Patient appears well-developed and well-nourished. Obese  No distress.  HEENT: head atraumatic, normocephalic, pupils equal and reactive to light,neck supple, throat within normal limits Cardiovascular: Normal rate, regular rhythm and normal heart sounds.  No murmur heard. No BLE edema. Pulmonary/Chest: Effort normal and breath sounds normal. No respiratory distress. Abdominal: Soft.  There is no tenderness. Psychiatric: Patient has a normal mood and affect. behavior is normal. Judgment and thought content normal.  Cognitive Testing - 6-CIT  Correct? Score   What year is it? yes 0 Yes = 0    No = 4  What month is it? yes 0 Yes = 0    No = 3  Remember:     Fl[place]de [place]ins, [address], [place]     What time is it? yes 0 Yes = 0    No = 3  Count backwards from 20 to 1 yes 0  Correct = 0    1 error = 2   More than 1 error = 4  Say the months of the year in reverse. yes 0 Correct = 0    1 error = 2   More than 1 error = 4  What address did I ask you to remember? yes 0 Correct = 0  1 error = 2    2 error = 4    3 error = 6    4 error = 8    All wrong = 10       TOTAL SCORE  0/28   Interpretation:  Normal  Normal (0-7) Abnormal (8-28)   Fall Risk: Fall Risk  02/19/2017 02/14/2016 08/08/2015 02/13/2015 08/14/2014  Falls in the past year? No No No No No    Depression Screen Depression screen [facility] 2/9 02/19/2017 02/14/2016 08/08/2015 02/13/2015 08/14/2014  Decreased Interest 0 0 0 0 0  Down, Depressed, Hopeless 0 0 0 0 0  PHQ - 2 Score 0 0 0 0 0    No results found for this or any previous visit (from the past 2160 hour(s)).  Assessment & Plan:    1. Encounter for Medicare annual wellness exam  Discussed importance of 150 minutes of physical activity weekly, eat two servings of fish weekly, eat one serving of tree nuts ( cashews, pistachios, pecans, almonds.Marland Kitchen) every other day, eat 6 servings of fruit/vegetables daily and drink plenty of water and avoid sweet beverages.   2. Morbid obesity due to excess calories (HCC)  Doing well with life style modification   3. Dyslipidemia  - Lipid panel -EKG  4. Benign essential HTN  - COMPLETE METABOLIC PANEL WITH GFR - CBC with Differential/Platelet _EKG  5. Elevated fasting blood sugar  - Hemoglobin A1c - Insulin, random  6. Chronic right hip pain   7. Ovarian failure  - DG Bone Density; Future   Immunization History  Administered Date(s) Administered  .  Influenza, High Dose Seasonal PF 02/14/2016  . Influenza,inj,Quad PF,6+ Mos 01/29/2015  . Influenza-Unspecified 02/10/2014, 12/05/2016  . Pneumococcal Conjugate-13 08/14/2014  . Pneumococcal Polysaccharide-23 01/27/2012  . Tdap 12/27/2010  . Zoster 01/01/2011    Health Maintenance  Topic Date Due  . MAMMOGRAM  06/05/2018  . TETANUS/TDAP   12/26/2020  . COLONOSCOPY  06/25/2021  . INFLUENZA VACCINE  Completed  . DEXA SCAN  Completed  . Hepatitis C Screening  Completed  . PNA vac Low Risk Adult  Completed    No orders of the defined types were placed in this encounter.   Current Outpatient Medications:  .  acetaminophen (TYLENOL) 500 MG tablet, Take 1 tablet by mouth 3 (three) times daily., Disp: , Rfl:  .  ASPIRIN LOW DOSE 81 MG chewable tablet, CHEW AND SWALLOW 1 TABLET  DAILY, Disp: 90 tablet, Rfl: 3 .  olmesartan-hydrochlorothiazide (BENICAR HCT) 40-25 MG tablet, TAKE 1 TABLET BY MOUTH  DAILY, Disp: 90 tablet, Rfl: 0 .  rosuvastatin (CRESTOR) 10 MG tablet, TAKE 1 TABLET BY MOUTH  DAILY, Disp: 90 tablet, Rfl: 0 .  triamcinolone cream (KENALOG) 0.1 %, Apply topically., Disp: , Rfl:  .  meloxicam (MOBIC) 7.5 MG tablet, Take 1 tablet by mouth 2 (two) times daily., Disp: , Rfl:  There are no discontinued medications.  I have personally reviewed and addressed the Medicare Annual Wellness health risk assessment questionnaire and have noted the following in the patient's chart:  A.         Medical and social history & family history B.         Use of alcohol, tobacco, and illicit drugs  C.         Current medications and supplements D.         Functional and Cognitive ability and status E.         Nutritional status F.         Physical activity G.        Advance directives H.         List of other physicians I.          Hospitalizations, surgeries, and ER visits in previous 12 months J.         Vitals K.         Screenings such as hearing, vision, cognitive function, and depression L.         Referrals and appointments: bone density   In addition, I have reviewed and discussed with patient certain preventive protocols, quality metrics, and best practice recommendations. A written personalized care plan for preventive services as well as general preventive health recommendations were provided to patient.  See attached  scanned questionnaire for additional information.

## 2017-02-19 NOTE — Patient Instructions (Signed)
Preventive Care 70 Years and Older, Female Preventive care refers to lifestyle choices and visits with your health care provider that can promote health and wellness. What does preventive care include?  A yearly physical exam. This is also called an annual well check.  Dental exams once or twice a year.  Routine eye exams. Ask your health care provider how often you should have your eyes checked.  Personal lifestyle choices, including: ? Daily care of your teeth and gums. ? Regular physical activity. ? Eating a healthy diet. ? Avoiding tobacco and drug use. ? Limiting alcohol use. ? Practicing safe sex. ? Taking low-dose aspirin every day. ? Taking vitamin and mineral supplements as recommended by your health care provider. What happens during an annual well check? The services and screenings done by your health care provider during your annual well check will depend on your age, overall health, lifestyle risk factors, and family history of disease. Counseling Your health care provider may ask you questions about your:  Alcohol use.  Tobacco use.  Drug use.  Emotional well-being.  Home and relationship well-being.  Sexual activity.  Eating habits.  History of falls.  Memory and ability to understand (cognition).  Work and work environment.  Reproductive health.  Screening You may have the following tests or measurements:  Height, weight, and BMI.  Blood pressure.  Lipid and cholesterol levels. These may be checked every 5 years, or more frequently if you are over 50 years old.  Skin check.  Lung cancer screening. You may have this screening every year starting at age 55 if you have a 30-pack-year history of smoking and currently smoke or have quit within the past 15 years.  Fecal occult blood test (FOBT) of the stool. You may have this test every year starting at age 50.  Flexible sigmoidoscopy or colonoscopy. You may have a sigmoidoscopy every 5 years or  a colonoscopy every 10 years starting at age 50.  Hepatitis C blood test.  Hepatitis B blood test.  Sexually transmitted disease (STD) testing.  Diabetes screening. This is done by checking your blood sugar (glucose) after you have not eaten for a while (fasting). You may have this done every 1-3 years.  Bone density scan. This is done to screen for osteoporosis. You may have this done starting at age 65.  Mammogram. This may be done every 1-2 years. Talk to your health care provider about how often you should have regular mammograms.  Talk with your health care provider about your test results, treatment options, and if necessary, the need for more tests. Vaccines Your health care provider may recommend certain vaccines, such as:  Influenza vaccine. This is recommended every year.  Tetanus, diphtheria, and acellular pertussis (Tdap, Td) vaccine. You may need a Td booster every 10 years.  Varicella vaccine. You may need this if you have not been vaccinated.  Zoster vaccine. You may need this after age 60.  Measles, mumps, and rubella (MMR) vaccine. You may need at least one dose of MMR if you were born in 1957 or later. You may also need a second dose.  Pneumococcal 13-valent conjugate (PCV13) vaccine. One dose is recommended after age 65.  Pneumococcal polysaccharide (PPSV23) vaccine. One dose is recommended after age 65.  Meningococcal vaccine. You may need this if you have certain conditions.  Hepatitis A vaccine. You may need this if you have certain conditions or if you travel or work in places where you may be exposed to hepatitis   A.  Hepatitis B vaccine. You may need this if you have certain conditions or if you travel or work in places where you may be exposed to hepatitis B.  Haemophilus influenzae type b (Hib) vaccine. You may need this if you have certain conditions.  Talk to your health care provider about which screenings and vaccines you need and how often you  need them. This information is not intended to replace advice given to you by your health care provider. Make sure you discuss any questions you have with your health care provider. Document Released: 03/16/2015 Document Revised: 11/07/2015 Document Reviewed: 12/19/2014 Elsevier Interactive Patient Education  2018 [employer] Inc.  

## 2017-02-20 LAB — COMPLETE METABOLIC PANEL WITH GFR
AG RATIO: 1.5 (calc) (ref 1.0–2.5)
ALBUMIN MSPROF: 4.4 g/dL (ref 3.6–5.1)
ALKALINE PHOSPHATASE (APISO): 62 U/L (ref 33–130)
ALT: 12 U/L (ref 6–29)
AST: 15 U/L (ref 10–35)
BILIRUBIN TOTAL: 0.8 mg/dL (ref 0.2–1.2)
BUN: 13 mg/dL (ref 7–25)
CHLORIDE: 101 mmol/L (ref 98–110)
CO2: 31 mmol/L (ref 20–32)
Calcium: 9.9 mg/dL (ref 8.6–10.4)
Creat: 0.84 mg/dL (ref 0.60–0.93)
GFR, EST AFRICAN AMERICAN: 82 mL/min/{1.73_m2} (ref >=60)
GFR, Est Non African American: 70 mL/min/{1.73_m2} (ref >=60)
GLOBULIN: 2.9 g/dL (ref 1.9–3.7)
GLUCOSE: 109 mg/dL — AB (ref 65–99)
POTASSIUM: 4 mmol/L (ref 3.5–5.3)
SODIUM: 141 mmol/L (ref 135–146)
TOTAL PROTEIN: 7.3 g/dL (ref 6.1–8.1)

## 2017-02-20 LAB — CBC WITH DIFFERENTIAL/PLATELET
BASOS ABS: 52 {cells}/uL (ref 0–200)
Basophils Relative: 0.8 %
EOS PCT: 1.1 %
Eosinophils Absolute: 72 cells/uL (ref 15–500)
HEMATOCRIT: 41.7 % (ref 35.0–45.0)
Hemoglobin: 14 g/dL (ref 11.7–15.5)
LYMPHS ABS: 2483 {cells}/uL (ref 850–3900)
MCH: 27.7 pg (ref 27.0–33.0)
MCHC: 33.6 g/dL (ref 32.0–36.0)
MCV: 82.4 fL (ref 80.0–100.0)
MPV: 11.8 fL (ref 7.5–12.5)
Monocytes Relative: 9.7 %
NEUTROS PCT: 50.2 %
Neutro Abs: 3263 cells/uL (ref 1500–7800)
PLATELETS: 358 10*3/uL (ref 140–400)
RBC: 5.06 10*6/uL (ref 3.80–5.10)
RDW: 14.6 % (ref 11.0–15.0)
TOTAL LYMPHOCYTE: 38.2 %
WBC mixed population: 631 cells/uL (ref 200–950)
WBC: 6.5 10*3/uL (ref 3.8–10.8)

## 2017-02-20 LAB — HEMOGLOBIN A1C
EAG (MMOL/L): 5.2 (calc)
HEMOGLOBIN A1C: 4.9 %{Hb} (ref <5.7)
MEAN PLASMA GLUCOSE: 94 (calc)

## 2017-02-20 LAB — LIPID PANEL
Cholesterol: 181 mg/dL (ref <200)
HDL: 73 mg/dL (ref >50)
LDL Cholesterol (Calc): 91 mg/dL (calc)
NON-HDL CHOLESTEROL (CALC): 108 mg/dL (ref <130)
TRIGLYCERIDES: 83 mg/dL (ref <150)
Total CHOL/HDL Ratio: 2.5 (calc) (ref <5.0)

## 2017-02-20 LAB — INSULIN, RANDOM: Insulin: 11 u[IU]/mL (ref 2.0–19.6)

## 2017-05-12 DIAGNOSIS — M170 Bilateral primary osteoarthritis of knee: Secondary | ICD-10-CM | POA: Diagnosis not present

## 2017-06-01 ENCOUNTER — Other Ambulatory Visit: Payer: Self-pay | Admitting: Obstetrics and Gynecology

## 2017-06-01 DIAGNOSIS — Z1231 Encounter for screening mammogram for malignant neoplasm of breast: Secondary | ICD-10-CM

## 2017-07-01 ENCOUNTER — Ambulatory Visit
Admission: RE | Admit: 2017-07-01 | Discharge: 2017-07-01 | Disposition: A | Discharge status: Home / Self Care | Payer: Medicare Other | Source: Ambulatory Visit | Attending: Obstetrics and Gynecology | Admitting: Obstetrics and Gynecology

## 2017-07-01 DIAGNOSIS — Z1231 Encounter for screening mammogram for malignant neoplasm of breast: Secondary | ICD-10-CM | POA: Diagnosis not present

## 2017-07-27 ENCOUNTER — Other Ambulatory Visit: Payer: Self-pay | Admitting: Family Medicine

## 2017-07-27 DIAGNOSIS — E785 Hyperlipidemia, unspecified: Secondary | ICD-10-CM

## 2017-07-30 DIAGNOSIS — H35372 Puckering of macula, left eye: Secondary | ICD-10-CM | POA: Diagnosis not present

## 2017-07-30 DIAGNOSIS — H3581 Retinal edema: Secondary | ICD-10-CM | POA: Diagnosis not present

## 2017-07-30 DIAGNOSIS — H348312 Tributary (branch) retinal vein occlusion, right eye, stable: Secondary | ICD-10-CM | POA: Diagnosis not present

## 2017-07-30 DIAGNOSIS — H35033 Hypertensive retinopathy, bilateral: Secondary | ICD-10-CM | POA: Diagnosis not present

## 2017-08-19 ENCOUNTER — Encounter: Payer: Self-pay | Admitting: Family Medicine

## 2017-08-19 ENCOUNTER — Ambulatory Visit (INDEPENDENT_AMBULATORY_CARE_PROVIDER_SITE_OTHER): Payer: Medicare Other | Admitting: Family Medicine

## 2017-08-19 VITALS — BP 120/70 | HR 88 | Resp 16 | Ht 62.6 in | Wt 188.4 lb

## 2017-08-19 DIAGNOSIS — E785 Hyperlipidemia, unspecified: Secondary | ICD-10-CM

## 2017-08-19 DIAGNOSIS — S61214A Laceration without foreign body of right ring finger without damage to nail, initial encounter: Secondary | ICD-10-CM

## 2017-08-19 DIAGNOSIS — Z23 Encounter for immunization: Secondary | ICD-10-CM | POA: Diagnosis not present

## 2017-08-19 DIAGNOSIS — Z87898 Personal history of other specified conditions: Secondary | ICD-10-CM | POA: Diagnosis not present

## 2017-08-19 DIAGNOSIS — I10 Essential (primary) hypertension: Secondary | ICD-10-CM | POA: Diagnosis not present

## 2017-08-19 DIAGNOSIS — R7301 Impaired fasting glucose: Secondary | ICD-10-CM

## 2017-08-19 DIAGNOSIS — R202 Paresthesia of skin: Secondary | ICD-10-CM

## 2017-08-19 LAB — COMPLETE METABOLIC PANEL WITHOUT GFR
AG Ratio: 1.5 (calc) (ref 1.0–2.5)
ALT: 10 U/L (ref 6–29)
AST: 15 U/L (ref 10–35)
Albumin: 4.3 g/dL (ref 3.6–5.1)
Alkaline phosphatase (APISO): 64 U/L (ref 33–130)
BUN: 17 mg/dL (ref 7–25)
CO2: 33 mmol/L — ABNORMAL HIGH (ref 20–32)
Calcium: 10 mg/dL (ref 8.6–10.4)
Chloride: 104 mmol/L (ref 98–110)
Creat: 0.89 mg/dL (ref 0.60–0.93)
GFR, Est African American: 76 mL/min/1.73m2
GFR, Est Non African American: 65 mL/min/1.73m2
Globulin: 2.9 g/dL (ref 1.9–3.7)
Glucose, Bld: 103 mg/dL — ABNORMAL HIGH (ref 65–99)
Potassium: 4.3 mmol/L (ref 3.5–5.3)
Sodium: 143 mmol/L (ref 135–146)
Total Bilirubin: 0.7 mg/dL (ref 0.2–1.2)
Total Protein: 7.2 g/dL (ref 6.1–8.1)

## 2017-08-19 MED ORDER — ROSUVASTATIN CALCIUM 10 MG PO TABS: 10.0000 mg | ORAL_TABLET | Freq: Every day | ORAL | 1 refills | Status: DC

## 2017-08-19 MED ORDER — OLMESARTAN MEDOXOMIL-HCTZ 40-25 MG PO TABS: 1.0000 | ORAL_TABLET | Freq: Every day | ORAL | 1 refills | Status: DC

## 2017-08-19 MED ORDER — SCOPOLAMINE 1 MG/3DAYS TD PT72: 1.0000 | MEDICATED_PATCH | TRANSDERMAL | 0 refills | Status: DC

## 2017-08-19 NOTE — Patient Instructions (Signed)
Check on Shingrix vaccine with insurance and local pharmacy

## 2017-08-19 NOTE — Progress Notes (Signed)
Name: Lisa Steele   MRN: [id number]    DOB: 1945/05/26   Date:08/19/2017       Progress Note  Subjective  Chief Complaint  Chief Complaint  Patient presents with  . Hypertension  . Obesity  . Medication Problem    She wants to talk about switching her medication brands.  . Laceration    She has a papercut on her right hand and a blister on her right foot and wants to have a tdap.    HPI  Obesity: she has lost 5 lbs over the past year, she is able to be active again, right hip is no longer bothering her as much. She also uses stationary bike. Eating healthy and monitoring her weight.    HTN: taking bp medication daily, she denies chest pain , SOB or palpitation. Denies dizziness. She has been very worried about medication recalls, and is considering switching to branded medication, but for now agrees in asking for [place] manufacture  of generic medication if possible.   Hyperlipidemia: taking Crestor, no myalgias, compliant with medications. Reveiewed labs and at goal. Calculated risk and benefit of aspirin use and we will continue medication for now.   Right hip pain: seeing PA Lenard Forth and Dr. Rosita Kea [facility]. She had MRI, showed OA, she is doing better, at times gets stiff when she gets up, but otherwise no problems. She has noticed paresthesia on top of right foot over the past few months , worse when at rest, no leg weakness.   Elevated glucose: family history of DM, previous hyperglycemia on labs, last hgbA1C was normal, but elevated fasting insulin, lost weight since last visit. We will continue to monitor for now  Finger laceration: she cut her finger on top of right hand , not sure how it happened, but is due for Tdap booster and would like to have it today. Bleeding subsided    Patient Active Problem List   Diagnosis Date Noted  . Morbid obesity due to excess calories (HCC) 02/19/2017  . Primary osteoarthritis of both knees 06/22/2014  . Obesity (BMI 30-39.9)  06/22/2014  . Dysmetabolic syndrome 06/22/2014  . Post-menopausal 06/22/2014  . H/O cold sores 06/22/2014  . Dyslipidemia 06/22/2014  . Elevated fasting blood sugar 06/22/2014  . Benign essential HTN 06/22/2014  . Abnormal ECG 06/22/2014    Past Surgical History:  Procedure Laterality Date  . BREAST EXCISIONAL BIOPSY Left 1970   neg  . BREAST SURGERY     cyst removed  . BUNIONECTOMY    . CATARACT EXTRACTION, BILATERAL  10/2013  . COLONOSCOPY WITH PROPOFOL N/A 06/25/2016   Procedure: COLONOSCOPY WITH PROPOFOL;  Surgeon: Scot Jun, MD;  Location: [facility] ENDOSCOPY;  Service: Endoscopy;  Laterality: N/A;  . ECTOPIC PREGNANCY SURGERY    . EYE SURGERY    . TONSILECTOMY, ADENOIDECTOMY, BILATERAL MYRINGOTOMY AND TUBES    . TONSILLECTOMY      Family History  Problem Relation Age of Onset  . Diabetes Mother   . Hypertension Mother   . Heart disease Mother   . Hyperlipidemia Mother   . Heart disease Father   . Diabetes Sister   . Heart disease Sister   . Hypertension Sister   . Hyperlipidemia Sister   . Breast cancer Sister 21  . Cancer Sister 59       breast  . Breast cancer Maternal Aunt   . Diabetes Sister   . Diabetes Sister   . Diabetes Sister  Social History   Socioeconomic History  . Marital status: Married    Spouse name: Chales Abrahamsyson   . Number of children: 1  . Years of education: Not on file  . Highest education level: Associate degree: academic program  Occupational History  . Occupation: Theatre stage managermeeting planner     Comment: [employer]   Social Needs  . Financial resource strain: Not hard at all  . Food insecurity:    Worry: Never true    Inability: Never true  . Transportation needs:    Medical: No    Non-medical: No  Tobacco Use  . Smoking status: Former Smoker    Years: 10.00    Start date: 03/03/1958  . Smokeless tobacco: Never Used  Substance and Sexual Activity  . Alcohol use: Yes    Alcohol/week: 0.0 oz    Comment: socially   . Drug use: No  . Sexual activity: Never    Comment: husband has ED  Lifestyle  . Physical activity:    Days per week: 3 days    Minutes per session: 40 min  . Stress: Only a little  Relationships  . Social connections:    Talks on phone: More than three times a week    Gets together: More than three times a week    Attends religious service: More than 4 times per year    Active member of club or organization: Yes    Attends meetings of clubs or organizations: More than 4 times per year    Relationship status: Married  . Intimate partner violence:    Fear of current or ex partner: No    Emotionally abused: No    Physically abused: No    Forced sexual activity: No  Other Topics Concern  . Not on file  Social History Narrative   Married, son lives in [place] and has 3 grandchildren , youngest born 10/2016     Current Outpatient Medications:  .  acetaminophen (TYLENOL) 500 MG tablet, Take 1 tablet by mouth 3 (three) times daily., Disp: , Rfl:  .  ASPIRIN LOW DOSE 81 MG chewable tablet, CHEW AND SWALLOW 1 TABLET  DAILY, Disp: 90 tablet, Rfl: 3 .  olmesartan-hydrochlorothiazide (BENICAR HCT) 40-25 MG tablet, TAKE 1 TABLET BY MOUTH  DAILY, Disp: 90 tablet, Rfl: 0 .  rosuvastatin (CRESTOR) 10 MG tablet, TAKE 1 TABLET BY MOUTH  DAILY, Disp: 90 tablet, Rfl: 0  Allergies  Allergen Reactions  . Doxycycline Itching  . Penicillins Hives and Rash    leg lesions and weakness (couldn't walk)     ROS  Constitutional: Negative for fever, positive for mild  weight change.  Respiratory: Negative for cough and shortness of breath.   Cardiovascular: Negative for chest pain or palpitations.  Gastrointestinal: Negative for abdominal pain, no bowel changes.  Musculoskeletal: Negative for gait problem or joint swelling.  Skin: Negative for rash.  Neurological: Negative for dizziness or headache.  No other specific complaints in a complete review of systems (except as listed in HPI  above).  Objective  Vitals:   08/19/17 0929  BP: 120/70  Pulse: 88  Resp: 16  SpO2: 98%  Weight: 188 lb 6.4 oz (85.5 kg)  Height: 5' 2.6" (1.59 m)    Body mass index is 33.8 kg/m.  Physical Exam  Constitutional: Patient appears well-developed and well-nourished. Obese  No distress.  HEENT: head atraumatic, normocephalic, pupils equal and reactive to light,  neck supple, throat within normal limits Cardiovascular: Normal rate, regular  rhythm and normal heart sounds.  No murmur heard. No BLE edema. Pulmonary/Chest: Effort normal and breath sounds normal. No respiratory distress. Abdominal: Soft.  There is no tenderness. Psychiatric: Patient has a normal mood and affect. behavior is normal. Judgment and thought content normal. Muscular Skeletal: normal sensation with monofilament test, normal rom of spine   PHQ2/9: Depression screen [facility] 2/9 02/19/2017 02/14/2016 08/08/2015 02/13/2015 08/14/2014  Decreased Interest 0 0 0 0 0  Down, Depressed, Hopeless 0 0 0 0 0  PHQ - 2 Score 0 0 0 0 0     Fall Risk: Fall Risk  02/19/2017 02/14/2016 08/08/2015 02/13/2015 08/14/2014  Falls in the past year? No No No No No     Assessment & Plan  1. Dyslipidemia  - rosuvastatin (CRESTOR) 10 MG tablet; Take 1 tablet (10 mg total) by mouth daily.  Dispense: 90 tablet; Refill: 1  2. Morbid obesity due to excess calories (HCC)  Based on BMI above 30 with co-morbidities - hyperlipidemia and HTN She lost 5 lbs since last visit   3. Benign essential HTN  Worried about recalls, would like brand medication that does not come from [place].  - olmesartan-hydrochlorothiazide (BENICAR HCT) 40-25 MG tablet; Take 1 tablet by mouth daily.  Dispense: 90 tablet; Refill: 1 - COMPLETE METABOLIC PANEL WITH GFR  4. Elevated fasting blood sugar  Last hgbA1C was normal   5. Need for Tdap vaccination  - Tdap vaccine greater than or equal to 7yo IM  6. Laceration of right ring finger without foreign body  without damage to nail, initial encounter  tdap since recent laceration   7. Paresthesia of right foot  Intermittent top of her foot, she does not want medication, explain it may be from superficial nerve entrapment or coming from her back. She wants to hold off on studies or referral to sub-specialist She has an Ortho and will mention there

## 2017-08-24 ENCOUNTER — Other Ambulatory Visit: Payer: Self-pay | Admitting: Family Medicine

## 2017-08-24 DIAGNOSIS — I10 Essential (primary) hypertension: Secondary | ICD-10-CM

## 2017-08-24 NOTE — Telephone Encounter (Signed)
Copied from CRM [id number]. Topic: Quick Communication - Rx Refill/Question >> Aug 24, 2017  3:05 PM Cipriano BunkerLambe, Annette S wrote: Medication: olmesartan-hydrochlorothiazide (BENICAR HCT) 40-25 MG tablet 90 tablet  This has been discontinued and will need a different prescription  Has the patient contacted their pharmacy? Yes.   (Agent: If no, request that the patient contact the pharmacy for the refill.) (Agent: If yes, when and what did the pharmacy advise?)  Preferred Pharmacy (with phone number or street name):   [facility] [place] - [id number] [place] [place] [id number] Phone: [phone] Fax: [phone]    Agent: Please be advised that RX refills may take up to 3 business days. We ask that you follow-up with your pharmacy.

## 2017-08-25 NOTE — Telephone Encounter (Signed)
Refill request for Hypertension medication:  Benicar HCT 40-25 mg  Last office visit pertaining to hypertension: 08/19/2017  BP Readings from Last 3 Encounters:  08/19/17 120/70  02/19/17 124/70  08/15/16 132/64     Lab Results  Component Value Date   CREATININE 0.89 08/19/2017   BUN 17 08/19/2017   NA 143 08/19/2017   K 4.3 08/19/2017   CL 104 08/19/2017   CO2 33 (H) 08/19/2017   Follow-ups on file. 03/08/2018

## 2017-09-25 DIAGNOSIS — M47812 Spondylosis without myelopathy or radiculopathy, cervical region: Secondary | ICD-10-CM | POA: Diagnosis not present

## 2017-09-25 DIAGNOSIS — S4992XA Unspecified injury of left shoulder and upper arm, initial encounter: Secondary | ICD-10-CM | POA: Diagnosis not present

## 2017-09-25 DIAGNOSIS — M19012 Primary osteoarthritis, left shoulder: Secondary | ICD-10-CM | POA: Diagnosis not present

## 2017-09-28 ENCOUNTER — Other Ambulatory Visit: Payer: Self-pay | Admitting: Family Medicine

## 2017-09-28 ENCOUNTER — Ambulatory Visit
Admission: RE | Admit: 2017-09-28 | Discharge: 2017-09-28 | Disposition: A | Discharge status: Home / Self Care | Payer: Medicare Other | Source: Ambulatory Visit | Attending: Family Medicine | Admitting: Family Medicine

## 2017-09-28 DIAGNOSIS — M25512 Pain in left shoulder: Secondary | ICD-10-CM | POA: Diagnosis not present

## 2017-09-28 DIAGNOSIS — I10 Essential (primary) hypertension: Secondary | ICD-10-CM

## 2017-09-28 DIAGNOSIS — E2839 Other primary ovarian failure: Secondary | ICD-10-CM | POA: Diagnosis not present

## 2017-09-28 DIAGNOSIS — Z780 Asymptomatic menopausal state: Secondary | ICD-10-CM | POA: Diagnosis not present

## 2017-09-28 DIAGNOSIS — E669 Obesity, unspecified: Secondary | ICD-10-CM | POA: Diagnosis not present

## 2017-09-28 DIAGNOSIS — Z13820 Encounter for screening for osteoporosis: Secondary | ICD-10-CM | POA: Diagnosis not present

## 2017-09-28 DIAGNOSIS — S46002A Unspecified injury of muscle(s) and tendon(s) of the rotator cuff of left shoulder, initial encounter: Secondary | ICD-10-CM | POA: Diagnosis not present

## 2017-09-28 NOTE — Telephone Encounter (Signed)
Copied from CRM [id number]. Topic: Quick Communication - See Telephone Encounter >> Sep 28, 2017  1:20 PM Mare LoanBurton, Donna F wrote: Pt is calling to see if Dr. Carlynn PurlSowles will call in a rx for the generic benicar to [facility] [place]  because the mail order she uses does not have the medication and they do not know when it will be available and pt Is completely out  [facility]    Best number [phone]

## 2017-09-29 MED ORDER — OLMESARTAN MEDOXOMIL-HCTZ 40-25 MG PO TABS: 1.0000 | ORAL_TABLET | Freq: Every day | ORAL | 0 refills | Status: DC

## 2017-09-29 NOTE — Telephone Encounter (Signed)
[facility]  Not available at mail order.

## 2017-10-01 DIAGNOSIS — M25612 Stiffness of left shoulder, not elsewhere classified: Secondary | ICD-10-CM | POA: Diagnosis not present

## 2017-10-01 DIAGNOSIS — M6281 Muscle weakness (generalized): Secondary | ICD-10-CM | POA: Diagnosis not present

## 2017-10-01 DIAGNOSIS — M67912 Unspecified disorder of synovium and tendon, left shoulder: Secondary | ICD-10-CM | POA: Diagnosis not present

## 2017-10-01 DIAGNOSIS — M25512 Pain in left shoulder: Secondary | ICD-10-CM | POA: Diagnosis not present

## 2017-10-05 DIAGNOSIS — M6281 Muscle weakness (generalized): Secondary | ICD-10-CM | POA: Diagnosis not present

## 2017-10-05 DIAGNOSIS — M25612 Stiffness of left shoulder, not elsewhere classified: Secondary | ICD-10-CM | POA: Diagnosis not present

## 2017-10-05 DIAGNOSIS — M67912 Unspecified disorder of synovium and tendon, left shoulder: Secondary | ICD-10-CM | POA: Diagnosis not present

## 2017-10-05 DIAGNOSIS — M25512 Pain in left shoulder: Secondary | ICD-10-CM | POA: Diagnosis not present

## 2017-10-07 DIAGNOSIS — M25612 Stiffness of left shoulder, not elsewhere classified: Secondary | ICD-10-CM | POA: Diagnosis not present

## 2017-10-07 DIAGNOSIS — M6281 Muscle weakness (generalized): Secondary | ICD-10-CM | POA: Diagnosis not present

## 2017-10-07 DIAGNOSIS — M25512 Pain in left shoulder: Secondary | ICD-10-CM | POA: Diagnosis not present

## 2017-10-07 DIAGNOSIS — M67912 Unspecified disorder of synovium and tendon, left shoulder: Secondary | ICD-10-CM | POA: Diagnosis not present

## 2017-10-12 DIAGNOSIS — M25512 Pain in left shoulder: Secondary | ICD-10-CM | POA: Diagnosis not present

## 2017-10-12 DIAGNOSIS — M6281 Muscle weakness (generalized): Secondary | ICD-10-CM | POA: Diagnosis not present

## 2017-10-12 DIAGNOSIS — M25612 Stiffness of left shoulder, not elsewhere classified: Secondary | ICD-10-CM | POA: Diagnosis not present

## 2017-10-12 DIAGNOSIS — M67912 Unspecified disorder of synovium and tendon, left shoulder: Secondary | ICD-10-CM | POA: Diagnosis not present

## 2017-10-14 DIAGNOSIS — M6281 Muscle weakness (generalized): Secondary | ICD-10-CM | POA: Diagnosis not present

## 2017-10-14 DIAGNOSIS — M67912 Unspecified disorder of synovium and tendon, left shoulder: Secondary | ICD-10-CM | POA: Diagnosis not present

## 2017-10-14 DIAGNOSIS — M25612 Stiffness of left shoulder, not elsewhere classified: Secondary | ICD-10-CM | POA: Diagnosis not present

## 2017-10-14 DIAGNOSIS — M25512 Pain in left shoulder: Secondary | ICD-10-CM | POA: Diagnosis not present

## 2017-10-21 DIAGNOSIS — E669 Obesity, unspecified: Secondary | ICD-10-CM | POA: Diagnosis not present

## 2017-10-21 DIAGNOSIS — S46002D Unspecified injury of muscle(s) and tendon(s) of the rotator cuff of left shoulder, subsequent encounter: Secondary | ICD-10-CM | POA: Diagnosis not present

## 2017-10-21 DIAGNOSIS — G8929 Other chronic pain: Secondary | ICD-10-CM | POA: Diagnosis not present

## 2017-10-21 DIAGNOSIS — M25512 Pain in left shoulder: Secondary | ICD-10-CM | POA: Diagnosis not present

## 2017-10-21 DIAGNOSIS — M25312 Other instability, left shoulder: Secondary | ICD-10-CM | POA: Diagnosis not present

## 2017-10-27 DIAGNOSIS — M19012 Primary osteoarthritis, left shoulder: Secondary | ICD-10-CM | POA: Diagnosis not present

## 2017-10-27 DIAGNOSIS — M65812 Other synovitis and tenosynovitis, left shoulder: Secondary | ICD-10-CM | POA: Diagnosis not present

## 2017-10-27 DIAGNOSIS — M24112 Other articular cartilage disorders, left shoulder: Secondary | ICD-10-CM | POA: Diagnosis not present

## 2017-10-27 DIAGNOSIS — M25412 Effusion, left shoulder: Secondary | ICD-10-CM | POA: Diagnosis not present

## 2017-10-27 DIAGNOSIS — S46012A Strain of muscle(s) and tendon(s) of the rotator cuff of left shoulder, initial encounter: Secondary | ICD-10-CM | POA: Diagnosis not present

## 2017-10-28 ENCOUNTER — Other Ambulatory Visit: Payer: Self-pay | Admitting: Family Medicine

## 2017-10-28 DIAGNOSIS — I10 Essential (primary) hypertension: Secondary | ICD-10-CM

## 2017-10-28 MED ORDER — OLMESARTAN MEDOXOMIL-HCTZ 40-25 MG PO TABS: 1.0000 | ORAL_TABLET | Freq: Every day | ORAL | 0 refills | Status: DC

## 2017-10-28 NOTE — Telephone Encounter (Signed)
Copied from CRM [id number]. Topic: Quick Communication - Rx Refill/Question >> Oct 28, 2017  1:24 PM Oneal GroutSebastian, Jennifer S wrote: Medication: olmesartan-hydrochlorothiazide (BENICAR HCT) 40-25 MG tablet   Has the patient contacted their pharmacy? Yes.   (Agent: If no, request that the patient contact the pharmacy for the refill.) (Agent: If yes, when and what did the pharmacy advise?)  Preferred Pharmacy (with phone number or street name): [facility], [facility] [place]  Agent: Please be advised that RX refills may take up to 3 business days. We ask that you follow-up with your pharmacy.  Requesting call once sent

## 2017-11-03 DIAGNOSIS — S46002D Unspecified injury of muscle(s) and tendon(s) of the rotator cuff of left shoulder, subsequent encounter: Secondary | ICD-10-CM | POA: Diagnosis not present

## 2017-11-03 DIAGNOSIS — S46002A Unspecified injury of muscle(s) and tendon(s) of the rotator cuff of left shoulder, initial encounter: Secondary | ICD-10-CM | POA: Insufficient documentation

## 2017-11-17 ENCOUNTER — Inpatient Hospital Stay: Admission: RE | Admit: 2017-11-17 | Payer: Medicare Other | Source: Ambulatory Visit

## 2017-11-23 ENCOUNTER — Other Ambulatory Visit: Payer: Self-pay

## 2017-11-23 ENCOUNTER — Encounter
Admission: RE | Admit: 2017-11-23 | Discharge: 2017-11-23 | Disposition: A | Discharge status: Home / Self Care | Payer: Medicare Other | Source: Ambulatory Visit | Attending: Unknown Physician Specialty | Admitting: Unknown Physician Specialty

## 2017-11-23 DIAGNOSIS — Z79899 Other long term (current) drug therapy: Secondary | ICD-10-CM | POA: Diagnosis not present

## 2017-11-23 DIAGNOSIS — I10 Essential (primary) hypertension: Secondary | ICD-10-CM | POA: Diagnosis not present

## 2017-11-23 DIAGNOSIS — Z87891 Personal history of nicotine dependence: Secondary | ICD-10-CM | POA: Diagnosis not present

## 2017-11-23 DIAGNOSIS — M7522 Bicipital tendinitis, left shoulder: Secondary | ICD-10-CM | POA: Diagnosis not present

## 2017-11-23 DIAGNOSIS — Y9289 Other specified places as the place of occurrence of the external cause: Secondary | ICD-10-CM | POA: Diagnosis not present

## 2017-11-23 DIAGNOSIS — Z7982 Long term (current) use of aspirin: Secondary | ICD-10-CM | POA: Diagnosis not present

## 2017-11-23 DIAGNOSIS — S46092A Other injury of muscle(s) and tendon(s) of the rotator cuff of left shoulder, initial encounter: Secondary | ICD-10-CM | POA: Diagnosis present

## 2017-11-23 DIAGNOSIS — Z880 Allergy status to penicillin: Secondary | ICD-10-CM | POA: Diagnosis not present

## 2017-11-23 DIAGNOSIS — Z01812 Encounter for preprocedural laboratory examination: Secondary | ICD-10-CM

## 2017-11-23 DIAGNOSIS — M75102 Unspecified rotator cuff tear or rupture of left shoulder, not specified as traumatic: Secondary | ICD-10-CM | POA: Diagnosis not present

## 2017-11-23 DIAGNOSIS — Z8249 Family history of ischemic heart disease and other diseases of the circulatory system: Secondary | ICD-10-CM | POA: Diagnosis not present

## 2017-11-23 DIAGNOSIS — Z6835 Body mass index (BMI) 35.0-35.9, adult: Secondary | ICD-10-CM | POA: Diagnosis not present

## 2017-11-23 DIAGNOSIS — Z885 Allergy status to narcotic agent status: Secondary | ICD-10-CM | POA: Diagnosis not present

## 2017-11-23 DIAGNOSIS — R9431 Abnormal electrocardiogram [ECG] [EKG]: Secondary | ICD-10-CM | POA: Insufficient documentation

## 2017-11-23 DIAGNOSIS — E7800 Pure hypercholesterolemia, unspecified: Secondary | ICD-10-CM | POA: Diagnosis not present

## 2017-11-23 DIAGNOSIS — W010XXA Fall on same level from slipping, tripping and stumbling without subsequent striking against object, initial encounter: Secondary | ICD-10-CM | POA: Diagnosis not present

## 2017-11-23 DIAGNOSIS — Z881 Allergy status to other antibiotic agents status: Secondary | ICD-10-CM | POA: Diagnosis not present

## 2017-11-23 DIAGNOSIS — Z01810 Encounter for preprocedural cardiovascular examination: Secondary | ICD-10-CM | POA: Diagnosis not present

## 2017-11-23 DIAGNOSIS — Y9311 Activity, swimming: Secondary | ICD-10-CM | POA: Diagnosis not present

## 2017-11-23 DIAGNOSIS — M1990 Unspecified osteoarthritis, unspecified site: Secondary | ICD-10-CM | POA: Diagnosis not present

## 2017-11-23 DIAGNOSIS — E669 Obesity, unspecified: Secondary | ICD-10-CM | POA: Diagnosis not present

## 2017-11-23 LAB — CBC
HEMATOCRIT: 43 % (ref 35.0–47.0)
HEMOGLOBIN: 14.6 g/dL (ref 12.0–16.0)
MCH: 28.4 pg (ref 26.0–34.0)
MCHC: 33.8 g/dL (ref 32.0–36.0)
MCV: 83.8 fL (ref 80.0–100.0)
Platelets: 306 10*3/uL (ref 150–440)
RBC: 5.13 MIL/uL (ref 3.80–5.20)
RDW: 15.8 % — ABNORMAL HIGH (ref 11.5–14.5)
WBC: 5.5 10*3/uL (ref 3.6–11.0)

## 2017-11-23 LAB — BASIC METABOLIC PANEL
ANION GAP: 11 (ref 5–15)
BUN: 15 mg/dL (ref 8–23)
CHLORIDE: 100 mmol/L (ref 98–111)
CO2: 27 mmol/L (ref 22–32)
Calcium: 9.1 mg/dL (ref 8.9–10.3)
Creatinine, Ser: 0.82 mg/dL (ref 0.44–1.00)
GFR calc Af Amer: >60 mL/min (ref >60)
GLUCOSE: 102 mg/dL — AB (ref 70–99)
POTASSIUM: 3.5 mmol/L (ref 3.5–5.1)
SODIUM: 138 mmol/L (ref 135–145)

## 2017-11-23 NOTE — Patient Instructions (Signed)
Your procedure is scheduled on: Wednesday, November 25, 2017 Report to Day Surgery on the 2nd floor of the [facility]. To find out your arrival time, please call [phone] between 1PM - 3PM on: Tuesday, November 24, 2017  REMEMBER: Instructions that are not followed completely may result in serious medical risk, up to and including death; or upon the discretion of your surgeon and anesthesiologist your surgery may need to be rescheduled.  Do not eat food after midnight the night before surgery.  No gum chewing, lozengers or hard candies.  You may however, drink CLEAR liquids up to 2 hours before you are scheduled to arrive for your surgery. Do not drink anything within 2 hours of the start of your surgery.  Clear liquids include: - water  - apple juice without pulp - gatorade - black coffee or tea (Do NOT add milk or creamers to the coffee or tea) Do NOT drink anything that is not on this list.  No Alcohol for 24 hours before or after surgery.  No Smoking including e-cigarettes for 24 hours prior to surgery.  No chewable tobacco products for at least 6 hours prior to surgery.  No nicotine patches on the day of surgery.  On the morning of surgery brush your teeth with toothpaste and water, you may rinse your mouth with mouthwash if you wish. Do not swallow any toothpaste or mouthwash.  Notify your doctor if there is any change in your medical condition (cold, fever, infection).  Do not wear jewelry, make-up, hairpins, clips or nail polish.  Do not wear lotions, powders, or perfumes.   Do not shave 48 hours prior to surgery.   Contacts and dentures may not be worn into surgery.  Do not bring valuables to the hospital, including drivers license, insurance or credit cards.  [facility] is not responsible for any belongings or valuables.   TAKE THESE MEDICATIONS THE MORNING OF SURGERY:  1.  Hydrocodone (if needed for pain)  Use CHG Soap as directed on instruction  sheet.  NOW!  Stop ASPIRIN and Anti-inflammatories (NSAIDS) such as Advil, Aleve, Ibuprofen, Motrin, Naproxen, Naprosyn and Aspirin based products such as Excedrin, Goodys Powder, BC Powder. (May take Tylenol or Acetaminophen if needed.)  Now!  Stop ANY OVER THE COUNTER supplements until after surgery.  Wear comfortable clothing (specific to your surgery type) to the hospital.  Plan for stool softeners for home use.  If you are being discharged the day of surgery, you will not be allowed to drive home. You will need a responsible adult to drive you home and stay with you that night.   If you are taking public transportation, you will need to have a responsible adult with you. Please confirm with your physician that it is acceptable to use public transportation.   Please call [phone] if you have any questions about these instructions.

## 2017-11-23 NOTE — Pre-Procedure Instructions (Signed)
Today's EKG results reviewed with Dr. Pernell DupreAdams (anesthesia); ok to proceed to surgery.

## 2017-11-24 DIAGNOSIS — M170 Bilateral primary osteoarthritis of knee: Secondary | ICD-10-CM | POA: Diagnosis not present

## 2017-11-25 ENCOUNTER — Ambulatory Visit: Payer: Medicare Other | Admitting: Anesthesiology

## 2017-11-25 ENCOUNTER — Ambulatory Visit
Admit: 2017-11-25 | Discharge: 2017-11-25 | Disposition: A | Discharge status: Home / Self Care | Payer: Medicare Other | Source: Ambulatory Visit | Attending: Unknown Physician Specialty | Admitting: Unknown Physician Specialty

## 2017-11-25 ENCOUNTER — Encounter
Disposition: A | Discharge status: Home / Self Care | Payer: Self-pay | Source: Ambulatory Visit | Attending: Unknown Physician Specialty

## 2017-11-25 DIAGNOSIS — M7522 Bicipital tendinitis, left shoulder: Secondary | ICD-10-CM | POA: Insufficient documentation

## 2017-11-25 DIAGNOSIS — M1990 Unspecified osteoarthritis, unspecified site: Secondary | ICD-10-CM | POA: Diagnosis not present

## 2017-11-25 DIAGNOSIS — Z6835 Body mass index (BMI) 35.0-35.9, adult: Secondary | ICD-10-CM | POA: Insufficient documentation

## 2017-11-25 DIAGNOSIS — I10 Essential (primary) hypertension: Secondary | ICD-10-CM | POA: Diagnosis not present

## 2017-11-25 DIAGNOSIS — W010XXA Fall on same level from slipping, tripping and stumbling without subsequent striking against object, initial encounter: Secondary | ICD-10-CM | POA: Insufficient documentation

## 2017-11-25 DIAGNOSIS — G8918 Other acute postprocedural pain: Secondary | ICD-10-CM | POA: Diagnosis not present

## 2017-11-25 DIAGNOSIS — E7800 Pure hypercholesterolemia, unspecified: Secondary | ICD-10-CM | POA: Diagnosis not present

## 2017-11-25 DIAGNOSIS — Z7982 Long term (current) use of aspirin: Secondary | ICD-10-CM | POA: Insufficient documentation

## 2017-11-25 DIAGNOSIS — E669 Obesity, unspecified: Secondary | ICD-10-CM | POA: Insufficient documentation

## 2017-11-25 DIAGNOSIS — M25512 Pain in left shoulder: Secondary | ICD-10-CM | POA: Diagnosis not present

## 2017-11-25 DIAGNOSIS — Z885 Allergy status to narcotic agent status: Secondary | ICD-10-CM | POA: Insufficient documentation

## 2017-11-25 DIAGNOSIS — Y9311 Activity, swimming: Secondary | ICD-10-CM | POA: Insufficient documentation

## 2017-11-25 DIAGNOSIS — Z79899 Other long term (current) drug therapy: Secondary | ICD-10-CM | POA: Insufficient documentation

## 2017-11-25 DIAGNOSIS — Z8249 Family history of ischemic heart disease and other diseases of the circulatory system: Secondary | ICD-10-CM | POA: Insufficient documentation

## 2017-11-25 DIAGNOSIS — Z881 Allergy status to other antibiotic agents status: Secondary | ICD-10-CM | POA: Insufficient documentation

## 2017-11-25 DIAGNOSIS — Z880 Allergy status to penicillin: Secondary | ICD-10-CM | POA: Insufficient documentation

## 2017-11-25 DIAGNOSIS — M75122 Complete rotator cuff tear or rupture of left shoulder, not specified as traumatic: Secondary | ICD-10-CM | POA: Diagnosis not present

## 2017-11-25 DIAGNOSIS — Y9289 Other specified places as the place of occurrence of the external cause: Secondary | ICD-10-CM | POA: Insufficient documentation

## 2017-11-25 DIAGNOSIS — M75102 Unspecified rotator cuff tear or rupture of left shoulder, not specified as traumatic: Secondary | ICD-10-CM | POA: Insufficient documentation

## 2017-11-25 DIAGNOSIS — E785 Hyperlipidemia, unspecified: Secondary | ICD-10-CM | POA: Diagnosis not present

## 2017-11-25 DIAGNOSIS — S46002A Unspecified injury of muscle(s) and tendon(s) of the rotator cuff of left shoulder, initial encounter: Secondary | ICD-10-CM | POA: Diagnosis not present

## 2017-11-25 DIAGNOSIS — Z87891 Personal history of nicotine dependence: Secondary | ICD-10-CM | POA: Insufficient documentation

## 2017-11-25 HISTORY — PX: SHOULDER ARTHROSCOPY WITH ROTATOR CUFF REPAIR AND SUBACROMIAL DECOMPRESSION: SHX5686

## 2017-11-25 SURGERY — SHOULDER ARTHROSCOPY WITH ROTATOR CUFF REPAIR AND SUBACROMIAL DECOMPRESSION
Anesthesia: General | Site: Shoulder | Laterality: Left

## 2017-11-25 MED ORDER — OXYCODONE HCL 5 MG/5ML PO SOLN: 5.0000 mg | Freq: Once | ORAL | Status: DC | PRN

## 2017-11-25 MED ORDER — EPHEDRINE SULFATE 50 MG/ML IJ SOLN
INTRAMUSCULAR | Status: DC | PRN
  Administered 2017-11-25 (×2): 5 mg via INTRAVENOUS

## 2017-11-25 MED ORDER — ROCURONIUM BROMIDE 100 MG/10ML IV SOLN
INTRAVENOUS | Status: DC | PRN
  Administered 2017-11-25: 40 mg via INTRAVENOUS

## 2017-11-25 MED ORDER — DEXAMETHASONE SODIUM PHOSPHATE 10 MG/ML IJ SOLN
INTRAMUSCULAR | Status: AC
  Filled 2017-11-25: qty 1

## 2017-11-25 MED ORDER — LIDOCAINE HCL (CARDIAC) PF 100 MG/5ML IV SOSY
PREFILLED_SYRINGE | INTRAVENOUS | Status: DC | PRN
  Administered 2017-11-25: 100 mg via INTRAVENOUS

## 2017-11-25 MED ORDER — FENTANYL CITRATE (PF) 100 MCG/2ML IJ SOLN: 25.0000 ug | INTRAMUSCULAR | Status: DC | PRN

## 2017-11-25 MED ORDER — DEXAMETHASONE SODIUM PHOSPHATE 10 MG/ML IJ SOLN
INTRAMUSCULAR | Status: DC | PRN
  Administered 2017-11-25: 8 mg via INTRAVENOUS

## 2017-11-25 MED ORDER — PHENYLEPHRINE HCL 10 MG/ML IJ SOLN
INTRAMUSCULAR | Status: AC
  Filled 2017-11-25: qty 1

## 2017-11-25 MED ORDER — EPHEDRINE SULFATE 50 MG/ML IJ SOLN
INTRAMUSCULAR | Status: AC
  Filled 2017-11-25: qty 1

## 2017-11-25 MED ORDER — SUGAMMADEX SODIUM 200 MG/2ML IV SOLN
INTRAVENOUS | Status: DC | PRN
  Administered 2017-11-25: 180 mg via INTRAVENOUS

## 2017-11-25 MED ORDER — ONDANSETRON HCL 4 MG/2ML IJ SOLN
INTRAMUSCULAR | Status: DC | PRN
  Administered 2017-11-25: 4 mg via INTRAVENOUS

## 2017-11-25 MED ORDER — FAMOTIDINE 20 MG PO TABS
20.0000 mg | ORAL_TABLET | Freq: Once | ORAL | Status: AC
  Administered 2017-11-25: 20 mg via ORAL

## 2017-11-25 MED ORDER — BUPIVACAINE HCL (PF) 0.5 % IJ SOLN
INTRAMUSCULAR | Status: DC | PRN
  Administered 2017-11-25: 7 mL via PERINEURAL
  Administered 2017-11-25: 3 mL via PERINEURAL

## 2017-11-25 MED ORDER — SODIUM CHLORIDE 0.9 % IV SOLN
INTRAVENOUS | Status: DC | PRN
  Administered 2017-11-25: 20 ug/min via INTRAVENOUS

## 2017-11-25 MED ORDER — EPINEPHRINE 30 MG/30ML IJ SOLN
INTRAMUSCULAR | Status: AC
  Filled 2017-11-25: qty 1

## 2017-11-25 MED ORDER — PROPOFOL 10 MG/ML IV BOLUS
INTRAVENOUS | Status: AC
  Filled 2017-11-25: qty 20

## 2017-11-25 MED ORDER — LACTATED RINGERS IR SOLN
Status: DC | PRN
  Administered 2017-11-25: 3000 mL

## 2017-11-25 MED ORDER — FENTANYL CITRATE (PF) 100 MCG/2ML IJ SOLN
INTRAMUSCULAR | Status: AC
  Administered 2017-11-25: 50 ug via INTRAVENOUS
  Filled 2017-11-25: qty 2

## 2017-11-25 MED ORDER — SUGAMMADEX SODIUM 200 MG/2ML IV SOLN
INTRAVENOUS | Status: AC
  Filled 2017-11-25: qty 2

## 2017-11-25 MED ORDER — MIDAZOLAM HCL 2 MG/2ML IJ SOLN
1.0000 mg | Freq: Once | INTRAMUSCULAR | Status: AC
  Administered 2017-11-25: 1 mg via INTRAVENOUS

## 2017-11-25 MED ORDER — EPINEPHRINE PF 1 MG/ML IJ SOLN
INTRAMUSCULAR | Status: DC | PRN
  Administered 2017-11-25: 4 mg

## 2017-11-25 MED ORDER — CLINDAMYCIN PHOSPHATE 600 MG/50ML IV SOLN
INTRAVENOUS | Status: DC | PRN
  Administered 2017-11-25: 600 mg via INTRAVENOUS

## 2017-11-25 MED ORDER — PROPOFOL 10 MG/ML IV BOLUS
INTRAVENOUS | Status: DC | PRN
  Administered 2017-11-25: 150 mg via INTRAVENOUS

## 2017-11-25 MED ORDER — PROMETHAZINE HCL 25 MG/ML IJ SOLN
6.2500 mg | Freq: Once | INTRAMUSCULAR | Status: AC
  Administered 2017-11-25: 6.25 mg via INTRAVENOUS

## 2017-11-25 MED ORDER — FAMOTIDINE 20 MG PO TABS
ORAL_TABLET | ORAL | Status: AC
  Administered 2017-11-25: 20 mg via ORAL
  Filled 2017-11-25: qty 1

## 2017-11-25 MED ORDER — MIDAZOLAM HCL 2 MG/2ML IJ SOLN
INTRAMUSCULAR | Status: AC
  Administered 2017-11-25: 1 mg via INTRAVENOUS
  Filled 2017-11-25: qty 2

## 2017-11-25 MED ORDER — SODIUM CHLORIDE 0.9 % IJ SOLN
INTRAMUSCULAR | Status: AC
  Filled 2017-11-25: qty 10

## 2017-11-25 MED ORDER — LACTATED RINGERS IV SOLN
INTRAVENOUS | Status: DC
  Administered 2017-11-25: 07:00:00 via INTRAVENOUS

## 2017-11-25 MED ORDER — ONDANSETRON HCL 4 MG/2ML IJ SOLN
INTRAMUSCULAR | Status: AC
  Filled 2017-11-25: qty 2

## 2017-11-25 MED ORDER — OXYCODONE HCL 5 MG PO TABS: 5.0000 mg | ORAL_TABLET | Freq: Once | ORAL | Status: DC | PRN

## 2017-11-25 MED ORDER — LIDOCAINE HCL (PF) 1 % IJ SOLN
INTRAMUSCULAR | Status: AC
  Filled 2017-11-25: qty 5

## 2017-11-25 MED ORDER — ROCURONIUM BROMIDE 50 MG/5ML IV SOLN
INTRAVENOUS | Status: AC
  Filled 2017-11-25: qty 1

## 2017-11-25 MED ORDER — BUPIVACAINE LIPOSOME 1.3 % IJ SUSP
INTRAMUSCULAR | Status: AC
  Filled 2017-11-25: qty 20

## 2017-11-25 MED ORDER — FENTANYL CITRATE (PF) 100 MCG/2ML IJ SOLN
INTRAMUSCULAR | Status: DC | PRN
  Administered 2017-11-25: 100 ug via INTRAVENOUS

## 2017-11-25 MED ORDER — PROMETHAZINE HCL 25 MG/ML IJ SOLN
INTRAMUSCULAR | Status: AC
  Administered 2017-11-25: 6.25 mg via INTRAVENOUS
  Filled 2017-11-25: qty 1

## 2017-11-25 MED ORDER — BUPIVACAINE HCL (PF) 0.5 % IJ SOLN
INTRAMUSCULAR | Status: AC
  Filled 2017-11-25: qty 10

## 2017-11-25 MED ORDER — PHENYLEPHRINE HCL 10 MG/ML IJ SOLN
INTRAMUSCULAR | Status: DC | PRN
  Administered 2017-11-25 (×2): 200 ug via INTRAVENOUS

## 2017-11-25 MED ORDER — BUPIVACAINE LIPOSOME 1.3 % IJ SUSP
INTRAMUSCULAR | Status: DC | PRN
  Administered 2017-11-25: 7 mL via PERINEURAL
  Administered 2017-11-25: 13 mL via PERINEURAL

## 2017-11-25 MED ORDER — LIDOCAINE HCL (PF) 2 % IJ SOLN
INTRAMUSCULAR | Status: AC
  Filled 2017-11-25: qty 10

## 2017-11-25 MED ORDER — FENTANYL CITRATE (PF) 100 MCG/2ML IJ SOLN
50.0000 ug | Freq: Once | INTRAMUSCULAR | Status: AC
  Administered 2017-11-25: 50 ug via INTRAVENOUS

## 2017-11-25 MED ORDER — CLINDAMYCIN PHOSPHATE 600 MG/50ML IV SOLN
INTRAVENOUS | Status: AC
  Filled 2017-11-25: qty 50

## 2017-11-25 MED ORDER — FENTANYL CITRATE (PF) 100 MCG/2ML IJ SOLN
INTRAMUSCULAR | Status: AC
  Filled 2017-11-25: qty 2

## 2017-11-25 SURGICAL SUPPLY — 73 items
ADAPTER IRRIG TUBE 2 SPIKE SOL (ADAPTER) ×3 IMPLANT
ANCHOR ALL-SUT Q-FIX 2.8 (Anchor) ×2 IMPLANT
ANCHOR SUT 5.5 SPEEDSCREW (Screw) ×3 IMPLANT
ARTHROWAND PARAGON T2 (SURGICAL WAND)
BLADE ABRADER 4.5 (BLADE) ×2 IMPLANT
BLADE SHAVER 4.5X7 STR FR (MISCELLANEOUS) ×2 IMPLANT
BLADE SURG 15 STRL LF DISP TIS (BLADE) IMPLANT
BLADE SURG 15 STRL SS (BLADE)
BUR ABRADER 5.5 BLK (MISCELLANEOUS) ×1 IMPLANT
BUR BR 5.5 WIDE MOUTH (BURR) ×2 IMPLANT
BURR ABRADER 5.5 BLK (MISCELLANEOUS) ×2
CANNULA 8.5X75 THRED (CANNULA) IMPLANT
CANNULA SHAVER 8MMX76MM (CANNULA) IMPLANT
CHLORAPREP W/TINT 26ML (MISCELLANEOUS) ×4 IMPLANT
DRAPE STERI 35X30 U-POUCH (DRAPES) ×2 IMPLANT
DRAPE UTILITY 15X26 TOWEL STRL (DRAPES) ×1 IMPLANT
ELECT REM PT RETURN 9FT ADLT (ELECTROSURGICAL) ×2
ELECTRODE REM PT RTRN 9FT ADLT (ELECTROSURGICAL) ×1 IMPLANT
GAUZE SPONGE 4X4 12PLY STRL (GAUZE/BANDAGES/DRESSINGS) ×2 IMPLANT
GLOVE BIO SURGEON STRL SZ8 (GLOVE) ×2 IMPLANT
GLOVE BIOGEL M STRL SZ7.5 (GLOVE) ×2 IMPLANT
GLOVE INDICATOR 8.0 STRL GRN (GLOVE) ×2 IMPLANT
GOWN STRL REUS W/ TWL LRG LVL3 (GOWN DISPOSABLE) ×1 IMPLANT
GOWN STRL REUS W/TWL LRG LVL3 (GOWN DISPOSABLE) ×1
GOWN STRL REUS W/TWL LRG LVL4 (GOWN DISPOSABLE) ×2 IMPLANT
IV LACTATED RINGER IRRG 3000ML (IV SOLUTION) ×1
IV LR IRRIG 3000ML ARTHROMATIC (IV SOLUTION) ×1 IMPLANT
KIT SHOULDER TRACTION (DRAPES) ×2 IMPLANT
KIT SUTURE 2.8 Q-FIX DISP (MISCELLANEOUS) ×1 IMPLANT
MANIFOLD NEPTUNE II (INSTRUMENTS) ×2 IMPLANT
NDL MAYO CATGUT SZ 2 (NEEDLE) IMPLANT
NDL SAFETY ECLIPSE 18X1.5 (NEEDLE) IMPLANT
NDL SPNL 18GX3.5 QUINCKE PK (NEEDLE) IMPLANT
NEEDLE HYPO 18GX1.5 SHARP (NEEDLE)
NEEDLE MAYO CATGUT SZ 1.5 (NEEDLE)
NEEDLE MAYO CATGUT SZ 2 (NEEDLE) IMPLANT
NEEDLE SPNL 18GX3.5 QUINCKE PK (NEEDLE) IMPLANT
PACK ARTHROSCOPY SHOULDER (MISCELLANEOUS) ×2 IMPLANT
PASSER SUT CAPTURE FIRST (SUTURE) ×1 IMPLANT
SET TUBE SUCT SHAVER OUTFL 24K (TUBING) ×2 IMPLANT
SLING ULTRA II M (MISCELLANEOUS) ×1 IMPLANT
SOL PREP PVP 2OZ (MISCELLANEOUS)
SOLUTION PREP PVP 2OZ (MISCELLANEOUS) ×1 IMPLANT
STAPLER SKIN PROX 35W (STAPLE) IMPLANT
SUT ETHIBOND NAB CT1 #1 30IN (SUTURE) ×1 IMPLANT
SUT ETHILON 3-0 FS-10 30 BLK (SUTURE)
SUT PDS AB 1 CT1 27 (SUTURE) IMPLANT
SUT PROLENE 2 0 CT2 30 (SUTURE) IMPLANT
SUT SMART STITCH CARTRIDGE (SUTURE) IMPLANT
SUT TICRON 2-0 30IN 311381 (SUTURE) ×1 IMPLANT
SUT VIC AB 0 CT1 36 (SUTURE) IMPLANT
SUT VIC AB 0 CT2 27 (SUTURE) ×1 IMPLANT
SUT VIC AB 2-0 CT1 27 (SUTURE)
SUT VIC AB 2-0 CT1 TAPERPNT 27 (SUTURE) IMPLANT
SUT VIC AB 2-0 CT2 27 (SUTURE) IMPLANT
SUT VIC AB 2-0 SH 27 (SUTURE) ×1
SUT VIC AB 2-0 SH 27XBRD (SUTURE) IMPLANT
SUT VIC AB 3-0 SH 27 (SUTURE)
SUT VIC AB 3-0 SH 27X BRD (SUTURE) IMPLANT
SUTURE EHLN 3-0 FS-10 30 BLK (SUTURE) IMPLANT
SUTURE MAGNUM WIRE 2X48 BLK (SUTURE) IMPLANT
SYR 10ML LL (SYRINGE) IMPLANT
TAPE MICROFOAM 4IN (TAPE) ×2 IMPLANT
TUBING ARTHRO INFLOW-ONLY STRL (TUBING) ×2 IMPLANT
WAND 30 DEG SABER W/CORD (SURGICAL WAND) IMPLANT
WAND ARTHRO PARAGON T2 (SURGICAL WAND) IMPLANT
WAND COVAC 50 IFS (MISCELLANEOUS) IMPLANT
WAND COVATOR 20 (MISCELLANEOUS) IMPLANT
WAND HAND CNTRL MULTIVAC 50 (MISCELLANEOUS) IMPLANT
WAND HAND CNTRL MULTIVAC 90 (MISCELLANEOUS) ×1 IMPLANT
WAND TOPAZ EPF  WAS Q (MISCELLANEOUS)
WAND TOPAZ EPF WAS Q (MISCELLANEOUS) IMPLANT
WAND TOPAZ MICRO DEBRIDER (MISCELLANEOUS) IMPLANT

## 2017-11-25 NOTE — Op Note (Signed)
11/25/2017  10:26 AM  Patient:   Lisa Steele  Pre-Op Diagnosis:   INJURY OF LEFT ROTATOR CUFF  Postoperative diagnosis: Tendinitis of the long head of the biceps tendon left shoulder plus rotator cuff tear left shoulder  Procedure: Arthroscopic release of the long head of the biceps tendon followed by mini incision rotator cuff repair left shoulder  Anesthesia: General endotracheal with interscalene block placed preoperatively by the anesthesiologist.  Findings: As above.   Complications: None  Estimated blood loss: negligible  Tourniquet time: None  Drains: None   Brief clinical note:  The patient's symptoms have progressed despite medications, activity modification, etc. The patient's history and examination are consistent with torn left rotator cuff.  These findings were confirmed by MRI scan. The patient presents at this time for definitive management of these shoulder symptoms.  Procedure: The patient had an interscalene block on the left shoulder in the preop area. The patient was then brought into the operating room and placed in the supine position. The patient then underwent  endotracheal intubation and general anesthesia before being repositioned in the lateral decubitus position. The left shoulder was prepped and draped in usual fashion for shoulder procedure. The shoulder was supported with the Acufex shoulder suspension device.  12 pounds of traction was utilized. Preoperative antibiotics were administered. A timeout was performed . A posterior portal was created and the glenohumeral joint thoroughly inspected revealing fraying of the long head of the biceps tendon. An anterior portal was created. An ArthroCare wand was inserted and used to obtain hemostasis as well as to perform a limited synovectomy.The biceps tendon was evaluated and then released from its labral attachment using an ArthroCare wand.  The scope was repositioned through the posterior  portal into the subacromial space. A separate lateral portal was created using an outside-in technique. The rotator cuff was inspected revealing a moderate sized tear.  The tear involved the supraspinatus insertion and the more anterior portion of the infraspinatus insertion.  An ArthroCare 90 wand followed by a 4.0 full-radius resector was introduced and used to perform a subtotal bursectomy.  I also used a 5.5 mm round burr to lightly decorticate the greater tuberosity in the intended area of the rotator rotator cuff reattachment. I also used a small punch to make vascular vent holes in the greater tuberosity. The instruments were then removed from the subacromial space.  An approximately 4 cm incision was made over the midlateral aspect of the shoulder.   This incision was carried down through the subcutaneous tissues onto the deltoid. The deltoid was divided in line with its fibers to provide access into the subacromial space. The rotator cuff tear was readily identified. The margins were debrided.   The tear was repaired using horizontal mattress sutures secured to two Q- Fix anchors. The horizontal mattress suture tails were then crisscrossed over to 2 laterally placed 5.5 speed screws. This gave me a 2 row repair of the torn rotator cuff. The repaired cuff seemed to be  quite stable.   The wound was copiously irrigated with sterile saline solution before the deltoid was repaired to bone with a  #1 ethibond suture.  Deltoid interval was closed with 0 vicryl. The subcutaneous tissues were closed  using 2-0 Vicryl interrupted sutures and the skin incision was closed using skin staples. The portal sites also were closed using staples. A sterile bulky dressing was applied to the shoulder and then the left upper extremity was placed into a shoulder immobilizer. The patient  was then awakened, extubated, and returned to the recovery room in satisfactory condition after tolerating the procedure well.  Blood  loss was negligible.

## 2017-11-25 NOTE — OR Nursing (Signed)
Discussed discharge instructions with pt and sisters. All voice understanding.

## 2017-11-25 NOTE — Progress Notes (Signed)
Wasted in sink: 50mcg Fentanyl, 1mg Versed.  Witnessed by Dorothy Anne Riguera RN. 

## 2017-11-25 NOTE — Anesthesia Procedure Notes (Signed)
Procedure Name: Intubation Performed by: Jamae Tison, CRNA Pre-anesthesia Checklist: Patient identified, Patient being monitored, Timeout performed, Emergency Drugs available and Suction available Patient Re-evaluated:Patient Re-evaluated prior to induction Oxygen Delivery Method: Circle system utilized Preoxygenation: Pre-oxygenation with 100% oxygen Induction Type: IV induction Ventilation: Mask ventilation without difficulty Laryngoscope Size: Mac and 3 Grade View: Grade I Tube type: Oral Tube size: 7.0 mm Number of attempts: 1 Airway Equipment and Method: Stylet Placement Confirmation: ETT inserted through vocal cords under direct vision,  positive ETCO2 and breath sounds checked- equal and bilateral Secured at: 21 cm Tube secured with: Tape Dental Injury: Teeth and Oropharynx as per pre-operative assessment        

## 2017-11-25 NOTE — Anesthesia Postprocedure Evaluation (Signed)
Anesthesia Post Note  Patient: Lisa Steele  Procedure(s) Performed: SHOULDER ARTHROSCOPic release of long head biceps tendon and mini open rotator cuff repair (Left Shoulder)  Patient location during evaluation: PACU Anesthesia Type: General Level of consciousness: awake and alert Pain management: pain level controlled Vital Signs Assessment: post-procedure vital signs reviewed and stable Respiratory status: spontaneous breathing, nonlabored ventilation, respiratory function stable and patient connected to nasal cannula oxygen Cardiovascular status: blood pressure returned to baseline and stable Postop Assessment: no apparent nausea or vomiting Anesthetic complications: no     Last Vitals:  Vitals:   11/25/17 1052 11/25/17 1100  BP: 129/67 (!) 122/56  Pulse: 63 74  Resp:  (!) 97  Temp: (!) 36.1 C (!) 36.3 C  SpO2: 97% 95%    Last Pain:  Vitals:   11/25/17 1100  TempSrc:   PainSc: 0-No pain                 Cleda Mccreedy Tykeshia Tourangeau

## 2017-11-25 NOTE — Anesthesia Preprocedure Evaluation (Signed)
Anesthesia Evaluation  Patient identified by MRN, date of birth, ID band Patient awake    Reviewed: Allergy & Precautions, H&P , NPO status , Patient's Chart, lab work & pertinent test results  History of Anesthesia Complications Negative for: history of anesthetic complications  Airway Mallampati: II  TM Distance: >3 FB Neck ROM: full    Dental  (+) Chipped   Pulmonary neg shortness of breath, former smoker,           Cardiovascular Exercise Tolerance: Good hypertension, (-) angina(-) Past MI and (-) DOE      Neuro/Psych negative neurological ROS  negative psych ROS   GI/Hepatic negative GI ROS, Neg liver ROS, neg GERD  ,  Endo/Other  negative endocrine ROS  Renal/GU      Musculoskeletal  (+) Arthritis ,   Abdominal   Peds  Hematology negative hematology ROS (+)   Anesthesia Other Findings Past Medical History: No date: Cataracts, bilateral No date: Hyperlipidemia No date: Hypertension  Past Surgical History: No date: APPENDECTOMY 1970: BREAST EXCISIONAL BIOPSY; Left     Comment:  neg No date: BREAST SURGERY; Left     Comment:  cyst removed No date: BUNIONECTOMY 10/2013: CATARACT EXTRACTION, BILATERAL 06/25/2016: COLONOSCOPY WITH PROPOFOL; N/A     Comment:  Procedure: COLONOSCOPY WITH PROPOFOL;  Surgeon: Scot Jun, MD;  Location: [facility] ENDOSCOPY;  Service:               Endoscopy;  Laterality: N/A; No date: ECTOPIC PREGNANCY SURGERY No date: EYE SURGERY No date: TONSILECTOMY, ADENOIDECTOMY, BILATERAL MYRINGOTOMY AND TUBES No date: TONSILLECTOMY     Reproductive/Obstetrics negative OB ROS                             Anesthesia Physical Anesthesia Plan  ASA: III  Anesthesia Plan: General ETT   Post-op Pain Management: GA combined w/ Regional for post-op pain   Induction: Intravenous  PONV Risk Score and Plan: Ondansetron, Dexamethasone, Midazolam  and Treatment may vary due to age or medical condition  Airway Management Planned: Oral ETT  Additional Equipment:   Intra-op Plan:   Post-operative Plan: Extubation in OR  Informed Consent: I have reviewed the patients History and Physical, chart, labs and discussed the procedure including the risks, benefits and alternatives for the proposed anesthesia with the patient or authorized representative who has indicated his/her understanding and acceptance.   Dental Advisory Given  Plan Discussed with: Anesthesiologist, CRNA and Surgeon  Anesthesia Plan Comments: (Patient consented for risks of anesthesia including but not limited to:  - adverse reactions to medications - damage to teeth, lips or other oral mucosa - sore throat or hoarseness - Damage to heart, brain, lungs or loss of life  Patient voiced understanding.)        Anesthesia Quick Evaluation

## 2017-11-25 NOTE — Anesthesia Post-op Follow-up Note (Signed)
Anesthesia QCDR form completed.        

## 2017-11-25 NOTE — Discharge Instructions (Signed)
Interscalene Nerve Block with Exparel  1.  For your surgery you have received an Interscalene Nerve Block with Exparel. 2. Nerve Blocks affect many types of nerves, including nerves that control movement, pain and normal sensation.  You may experience feelings such as numbness, tingling, heaviness, weakness or the inability to move your arm or the feeling or sensation that your arm has "fallen asleep". 3. A nerve block with Exparel can last up to 5 days.  Usually the weakness wears off first.  The tingling and heaviness usually wear off next.  Finally you may start to notice pain.  Keep in mind that this may occur in any order.  Once a nerve block starts to wear off it is usually completely gone within 60 minutes. 4. ISNB may cause mild shortness of breath, a hoarse voice, blurry vision, unequal pupils, or drooping of the face on the same side as the nerve block.  These symptoms will usually resolve with the numbness.  Very rarely the procedure itself can cause mild seizures. 5. If needed, your surgeon will give you a prescription for pain medication.  It will take about 60 minutes for the oral pain medication to become fully effective.  So, it is recommended that you start taking this medication before the nerve block first begins to wear off, or when you first begin to feel discomfort. 6. Take your pain medication only as prescribed.  Pain medication can cause sedation and decrease your breathing if you take more than you need for the level of pain that you have. 7. Nausea is a common side effect of many pain medications.  You may want to eat something before taking your pain medicine to prevent nausea. 8. After an Interscalene nerve block, you cannot feel pain, pressure or extremes in temperature in the effected arm.  Because your arm is numb it is at an increased risk for injury.  To decrease the possibility of injury, please practice the following:  a. While you are awake change the position of  your arm frequently to prevent too much pressure on any one area for prolonged periods of time. b.  If you have a cast or tight dressing, check the color or your fingers every couple of hours.  Call your surgeon with the appearance of any discoloration (white or blue). c. If you are given a sling to wear before you go home, please wear it  at all times until the block has completely worn off.  Do not get up at night without your sling. d. Please contact [facility] or your surgeon if you do not begin to regain sensation after 7 days from the surgery.  Anesthesia may be contacted by calling the Same Day Surgery Department, Mon. through Fri., 6 am to 4 pm at [phone].   e. If you experience any other problems or concerns, please contact your surgeon's office. If you experience severe or prolonged shortness of breath go to the nearest emergency department.Use shoulder immobilizer at all times  Keep dressing dry  Leave dressing in place until first postoperative visit  Return to the clinic about 1 week post surgery  Take 81 mg aspirin or Bufferin tablet twice a day for 2 weeks post surgery  Can sleep with multiple pillows behind the back or in a recliner  Use TENS unit if prescribed  Take pain medication prior to going to sleep the evening of your surgery  Ice pack prn  Activity as tolerated  Keep sling on at  all times  AMBULATORY SURGERY  DISCHARGE INSTRUCTIONS   1) The drugs that you were given will stay in your system until tomorrow so for the next 24 hours you should not:  A) Drive an automobile B) Make any legal decisions C) Drink any alcoholic beverage   2) You may resume regular meals tomorrow.  Today it is better to start with liquids and gradually work up to solid foods.  You may eat anything you prefer, but it is better to start with liquids, then soup and crackers, and gradually work up to solid foods.   3) Please notify your doctor immediately if you have  any unusual bleeding, trouble breathing, redness and pain at the surgery site, drainage, fever, or pain not relieved by medication.    4) Additional Instructions:        Please contact your physician with any problems or Same Day Surgery at [phone], Monday through Friday 6 am to 4 pm, or [place] at [facility] number at [phone].

## 2017-11-25 NOTE — Progress Notes (Signed)
Wasted in sink: Fentanyl, 1mg  Versed.  Witnessed by Jorja Loa RN.

## 2017-11-25 NOTE — H&P (Signed)
  H and P reviewed. No changes. Uploaded at later date. 

## 2017-11-25 NOTE — Transfer of Care (Signed)
Immediate Anesthesia Transfer of Care Note  Patient: Lisa Steele  Procedure(s) Performed: SHOULDER ARTHROSCOPY WITH ROTATOR CUFF REPAIR AND SUBACROMIAL DECOMPRESSION (Left )  Patient Location: PACU  Anesthesia Type:General  Level of Consciousness: awake and responds to stimulation  Airway & Oxygen Therapy: Patient Spontanous Breathing and Patient connected to face mask oxygen  Post-op Assessment: Report given to RN and Post -op Vital signs reviewed and stable  Post vital signs: Reviewed and stable  Last Vitals:  Vitals Value Taken Time  BP 122/90 11/25/2017 10:12 AM  Temp 36.1 C 11/25/2017 10:10 AM  Pulse 77 11/25/2017 10:12 AM  Resp 13 11/25/2017 10:12 AM  SpO2 100 % 11/25/2017 10:12 AM    Last Pain:  Vitals:   11/25/17 0630  TempSrc: Tympanic  PainSc: 0-No pain         Complications: No apparent anesthesia complications

## 2017-11-25 NOTE — Anesthesia Procedure Notes (Signed)
Anesthesia Regional Block: Interscalene brachial plexus block   Pre-Anesthetic Checklist: ,, timeout performed, Correct Patient, Correct Site, Correct Laterality, Correct Procedure, Correct Position, site marked, Risks and benefits discussed,  Surgical consent,  Pre-op evaluation,  At surgeon's request and post-op pain management  Laterality: Upper and Left  Prep: chloraprep       Needles:  Injection technique: Single-shot  Needle Type: Stimiplex     Needle Length: 5cm  Needle Gauge: 22     Additional Needles:   Procedures:,,,, ultrasound used (permanent image in chart),,,,  Narrative:  Start time: 11/25/2017 7:20 AM End time: 11/25/2017 7:25 AM Injection made incrementally with aspirations every 5 mL.  Performed by: Personally  Anesthesiologist: Cadence Haslam, Cleda Mccreedy, MD  Additional Notes: Functioning IV was confirmed and monitors were applied.  A 50mm 22ga Stimuplex needle was used. Sterile prep,hand hygiene and sterile gloves were used.  Minimal sedation used for procedure.  No paresthesia endorsed by patient during the procedure.  Negative aspiration and negative test dose prior to incremental administration of local anesthetic. The patient tolerated the procedure well with no immediate complications.

## 2017-12-01 ENCOUNTER — Other Ambulatory Visit: Payer: Self-pay | Admitting: Family Medicine

## 2017-12-01 DIAGNOSIS — Z87898 Personal history of other specified conditions: Secondary | ICD-10-CM

## 2018-01-12 DIAGNOSIS — Z23 Encounter for immunization: Secondary | ICD-10-CM | POA: Diagnosis not present

## 2018-01-12 DIAGNOSIS — Z9889 Other specified postprocedural states: Secondary | ICD-10-CM | POA: Insufficient documentation

## 2018-01-14 DIAGNOSIS — M6281 Muscle weakness (generalized): Secondary | ICD-10-CM | POA: Diagnosis not present

## 2018-01-14 DIAGNOSIS — Z9889 Other specified postprocedural states: Secondary | ICD-10-CM | POA: Diagnosis not present

## 2018-01-14 DIAGNOSIS — M25612 Stiffness of left shoulder, not elsewhere classified: Secondary | ICD-10-CM | POA: Diagnosis not present

## 2018-01-14 DIAGNOSIS — M25512 Pain in left shoulder: Secondary | ICD-10-CM | POA: Diagnosis not present

## 2018-01-14 DIAGNOSIS — R29898 Other symptoms and signs involving the musculoskeletal system: Secondary | ICD-10-CM | POA: Diagnosis not present

## 2018-01-18 DIAGNOSIS — Z9889 Other specified postprocedural states: Secondary | ICD-10-CM | POA: Diagnosis not present

## 2018-01-18 DIAGNOSIS — M25512 Pain in left shoulder: Secondary | ICD-10-CM | POA: Diagnosis not present

## 2018-01-18 DIAGNOSIS — M6281 Muscle weakness (generalized): Secondary | ICD-10-CM | POA: Diagnosis not present

## 2018-01-18 DIAGNOSIS — M25612 Stiffness of left shoulder, not elsewhere classified: Secondary | ICD-10-CM | POA: Diagnosis not present

## 2018-01-18 DIAGNOSIS — R29898 Other symptoms and signs involving the musculoskeletal system: Secondary | ICD-10-CM | POA: Diagnosis not present

## 2018-01-20 DIAGNOSIS — M25612 Stiffness of left shoulder, not elsewhere classified: Secondary | ICD-10-CM | POA: Diagnosis not present

## 2018-01-20 DIAGNOSIS — R29898 Other symptoms and signs involving the musculoskeletal system: Secondary | ICD-10-CM | POA: Diagnosis not present

## 2018-01-20 DIAGNOSIS — Z9889 Other specified postprocedural states: Secondary | ICD-10-CM | POA: Diagnosis not present

## 2018-01-20 DIAGNOSIS — M25512 Pain in left shoulder: Secondary | ICD-10-CM | POA: Diagnosis not present

## 2018-01-20 DIAGNOSIS — M6281 Muscle weakness (generalized): Secondary | ICD-10-CM | POA: Diagnosis not present

## 2018-01-24 ENCOUNTER — Other Ambulatory Visit: Payer: Self-pay | Admitting: Family Medicine

## 2018-01-24 DIAGNOSIS — E785 Hyperlipidemia, unspecified: Secondary | ICD-10-CM

## 2018-01-25 DIAGNOSIS — Z9889 Other specified postprocedural states: Secondary | ICD-10-CM | POA: Diagnosis not present

## 2018-01-25 DIAGNOSIS — R29898 Other symptoms and signs involving the musculoskeletal system: Secondary | ICD-10-CM | POA: Diagnosis not present

## 2018-01-25 DIAGNOSIS — M25512 Pain in left shoulder: Secondary | ICD-10-CM | POA: Diagnosis not present

## 2018-01-25 DIAGNOSIS — M6281 Muscle weakness (generalized): Secondary | ICD-10-CM | POA: Diagnosis not present

## 2018-01-25 DIAGNOSIS — M25612 Stiffness of left shoulder, not elsewhere classified: Secondary | ICD-10-CM | POA: Diagnosis not present

## 2018-01-27 DIAGNOSIS — M6281 Muscle weakness (generalized): Secondary | ICD-10-CM | POA: Diagnosis not present

## 2018-01-27 DIAGNOSIS — M25512 Pain in left shoulder: Secondary | ICD-10-CM | POA: Diagnosis not present

## 2018-01-27 DIAGNOSIS — Z9889 Other specified postprocedural states: Secondary | ICD-10-CM | POA: Diagnosis not present

## 2018-01-27 DIAGNOSIS — M25612 Stiffness of left shoulder, not elsewhere classified: Secondary | ICD-10-CM | POA: Diagnosis not present

## 2018-01-27 DIAGNOSIS — R29898 Other symptoms and signs involving the musculoskeletal system: Secondary | ICD-10-CM | POA: Diagnosis not present

## 2018-02-01 DIAGNOSIS — M25512 Pain in left shoulder: Secondary | ICD-10-CM | POA: Diagnosis not present

## 2018-02-01 DIAGNOSIS — M6281 Muscle weakness (generalized): Secondary | ICD-10-CM | POA: Diagnosis not present

## 2018-02-01 DIAGNOSIS — Z9889 Other specified postprocedural states: Secondary | ICD-10-CM | POA: Diagnosis not present

## 2018-02-01 DIAGNOSIS — R29898 Other symptoms and signs involving the musculoskeletal system: Secondary | ICD-10-CM | POA: Diagnosis not present

## 2018-02-01 DIAGNOSIS — M25612 Stiffness of left shoulder, not elsewhere classified: Secondary | ICD-10-CM | POA: Diagnosis not present

## 2018-02-03 DIAGNOSIS — M79642 Pain in left hand: Secondary | ICD-10-CM | POA: Insufficient documentation

## 2018-02-03 DIAGNOSIS — Z9889 Other specified postprocedural states: Secondary | ICD-10-CM | POA: Diagnosis not present

## 2018-02-03 DIAGNOSIS — M25512 Pain in left shoulder: Secondary | ICD-10-CM | POA: Diagnosis not present

## 2018-02-03 DIAGNOSIS — M25612 Stiffness of left shoulder, not elsewhere classified: Secondary | ICD-10-CM | POA: Diagnosis not present

## 2018-02-03 DIAGNOSIS — M6281 Muscle weakness (generalized): Secondary | ICD-10-CM | POA: Diagnosis not present

## 2018-02-03 DIAGNOSIS — R29898 Other symptoms and signs involving the musculoskeletal system: Secondary | ICD-10-CM | POA: Diagnosis not present

## 2018-02-08 DIAGNOSIS — M25512 Pain in left shoulder: Secondary | ICD-10-CM | POA: Diagnosis not present

## 2018-02-08 DIAGNOSIS — M25612 Stiffness of left shoulder, not elsewhere classified: Secondary | ICD-10-CM | POA: Diagnosis not present

## 2018-02-08 DIAGNOSIS — M6281 Muscle weakness (generalized): Secondary | ICD-10-CM | POA: Diagnosis not present

## 2018-02-08 DIAGNOSIS — Z9889 Other specified postprocedural states: Secondary | ICD-10-CM | POA: Diagnosis not present

## 2018-02-08 DIAGNOSIS — R29898 Other symptoms and signs involving the musculoskeletal system: Secondary | ICD-10-CM | POA: Diagnosis not present

## 2018-02-10 DIAGNOSIS — M25512 Pain in left shoulder: Secondary | ICD-10-CM | POA: Diagnosis not present

## 2018-02-10 DIAGNOSIS — R29898 Other symptoms and signs involving the musculoskeletal system: Secondary | ICD-10-CM | POA: Diagnosis not present

## 2018-02-10 DIAGNOSIS — M6281 Muscle weakness (generalized): Secondary | ICD-10-CM | POA: Diagnosis not present

## 2018-02-10 DIAGNOSIS — M25612 Stiffness of left shoulder, not elsewhere classified: Secondary | ICD-10-CM | POA: Diagnosis not present

## 2018-02-10 DIAGNOSIS — Z9889 Other specified postprocedural states: Secondary | ICD-10-CM | POA: Diagnosis not present

## 2018-02-15 ENCOUNTER — Other Ambulatory Visit: Payer: Self-pay | Admitting: Family Medicine

## 2018-02-15 DIAGNOSIS — R29898 Other symptoms and signs involving the musculoskeletal system: Secondary | ICD-10-CM | POA: Diagnosis not present

## 2018-02-15 DIAGNOSIS — M6281 Muscle weakness (generalized): Secondary | ICD-10-CM | POA: Diagnosis not present

## 2018-02-15 DIAGNOSIS — M25612 Stiffness of left shoulder, not elsewhere classified: Secondary | ICD-10-CM | POA: Diagnosis not present

## 2018-02-15 DIAGNOSIS — I10 Essential (primary) hypertension: Secondary | ICD-10-CM

## 2018-02-18 DIAGNOSIS — Z9889 Other specified postprocedural states: Secondary | ICD-10-CM | POA: Diagnosis not present

## 2018-02-18 DIAGNOSIS — R29898 Other symptoms and signs involving the musculoskeletal system: Secondary | ICD-10-CM | POA: Diagnosis not present

## 2018-02-18 DIAGNOSIS — M6281 Muscle weakness (generalized): Secondary | ICD-10-CM | POA: Diagnosis not present

## 2018-02-18 DIAGNOSIS — M25612 Stiffness of left shoulder, not elsewhere classified: Secondary | ICD-10-CM | POA: Diagnosis not present

## 2018-02-18 DIAGNOSIS — M25512 Pain in left shoulder: Secondary | ICD-10-CM | POA: Diagnosis not present

## 2018-02-22 DIAGNOSIS — Z9889 Other specified postprocedural states: Secondary | ICD-10-CM | POA: Diagnosis not present

## 2018-02-22 DIAGNOSIS — M25612 Stiffness of left shoulder, not elsewhere classified: Secondary | ICD-10-CM | POA: Diagnosis not present

## 2018-02-22 DIAGNOSIS — M6281 Muscle weakness (generalized): Secondary | ICD-10-CM | POA: Diagnosis not present

## 2018-02-22 DIAGNOSIS — R29898 Other symptoms and signs involving the musculoskeletal system: Secondary | ICD-10-CM | POA: Diagnosis not present

## 2018-02-22 DIAGNOSIS — M25512 Pain in left shoulder: Secondary | ICD-10-CM | POA: Diagnosis not present

## 2018-02-23 ENCOUNTER — Ambulatory Visit: Payer: Medicare Other

## 2018-02-25 DIAGNOSIS — R29898 Other symptoms and signs involving the musculoskeletal system: Secondary | ICD-10-CM | POA: Diagnosis not present

## 2018-02-25 DIAGNOSIS — M25612 Stiffness of left shoulder, not elsewhere classified: Secondary | ICD-10-CM | POA: Diagnosis not present

## 2018-02-25 DIAGNOSIS — Z9889 Other specified postprocedural states: Secondary | ICD-10-CM | POA: Diagnosis not present

## 2018-02-25 DIAGNOSIS — M25512 Pain in left shoulder: Secondary | ICD-10-CM | POA: Diagnosis not present

## 2018-02-25 DIAGNOSIS — M6281 Muscle weakness (generalized): Secondary | ICD-10-CM | POA: Diagnosis not present

## 2018-02-26 ENCOUNTER — Ambulatory Visit: Payer: Medicare Other

## 2018-03-01 DIAGNOSIS — Z9889 Other specified postprocedural states: Secondary | ICD-10-CM | POA: Diagnosis not present

## 2018-03-01 DIAGNOSIS — R29898 Other symptoms and signs involving the musculoskeletal system: Secondary | ICD-10-CM | POA: Diagnosis not present

## 2018-03-01 DIAGNOSIS — M25612 Stiffness of left shoulder, not elsewhere classified: Secondary | ICD-10-CM | POA: Diagnosis not present

## 2018-03-01 DIAGNOSIS — M25512 Pain in left shoulder: Secondary | ICD-10-CM | POA: Diagnosis not present

## 2018-03-01 DIAGNOSIS — H35372 Puckering of macula, left eye: Secondary | ICD-10-CM | POA: Diagnosis not present

## 2018-03-01 DIAGNOSIS — M6281 Muscle weakness (generalized): Secondary | ICD-10-CM | POA: Diagnosis not present

## 2018-03-04 ENCOUNTER — Ambulatory Visit: Payer: Medicare Other

## 2018-03-04 DIAGNOSIS — R29898 Other symptoms and signs involving the musculoskeletal system: Secondary | ICD-10-CM | POA: Diagnosis not present

## 2018-03-04 DIAGNOSIS — E669 Obesity, unspecified: Secondary | ICD-10-CM | POA: Diagnosis not present

## 2018-03-04 DIAGNOSIS — M25512 Pain in left shoulder: Secondary | ICD-10-CM | POA: Diagnosis not present

## 2018-03-04 DIAGNOSIS — M6281 Muscle weakness (generalized): Secondary | ICD-10-CM | POA: Diagnosis not present

## 2018-03-04 DIAGNOSIS — Z9889 Other specified postprocedural states: Secondary | ICD-10-CM | POA: Diagnosis not present

## 2018-03-04 DIAGNOSIS — M65312 Trigger thumb, left thumb: Secondary | ICD-10-CM | POA: Diagnosis not present

## 2018-03-04 DIAGNOSIS — M25612 Stiffness of left shoulder, not elsewhere classified: Secondary | ICD-10-CM | POA: Diagnosis not present

## 2018-03-08 ENCOUNTER — Ambulatory Visit: Payer: Medicare Other | Admitting: Family Medicine

## 2018-03-08 DIAGNOSIS — M25612 Stiffness of left shoulder, not elsewhere classified: Secondary | ICD-10-CM | POA: Diagnosis not present

## 2018-03-08 DIAGNOSIS — R29898 Other symptoms and signs involving the musculoskeletal system: Secondary | ICD-10-CM | POA: Diagnosis not present

## 2018-03-08 DIAGNOSIS — M6281 Muscle weakness (generalized): Secondary | ICD-10-CM | POA: Diagnosis not present

## 2018-03-08 DIAGNOSIS — M25512 Pain in left shoulder: Secondary | ICD-10-CM | POA: Diagnosis not present

## 2018-03-08 DIAGNOSIS — Z9889 Other specified postprocedural states: Secondary | ICD-10-CM | POA: Diagnosis not present

## 2018-03-09 ENCOUNTER — Ambulatory Visit (INDEPENDENT_AMBULATORY_CARE_PROVIDER_SITE_OTHER): Payer: Medicare Other

## 2018-03-09 ENCOUNTER — Telehealth: Payer: Self-pay

## 2018-03-09 VITALS — BP 110/68 | HR 76 | Temp 97.6°F | Resp 16 | Ht 63.0 in | Wt 203.2 lb

## 2018-03-09 DIAGNOSIS — Z1231 Encounter for screening mammogram for malignant neoplasm of breast: Secondary | ICD-10-CM

## 2018-03-09 DIAGNOSIS — Z0000 Encounter for general adult medical examination without abnormal findings: Secondary | ICD-10-CM

## 2018-03-09 NOTE — Telephone Encounter (Signed)
Pt seen in office today for AWV and requests refill for her benicar HCT sent to [facility]. Pt has appt scheduled for 04/12/18. Pt requests 90 day supply if possible. Thank you!

## 2018-03-09 NOTE — Patient Instructions (Signed)
Lisa Steele , Thank you for taking time to come for your Medicare Wellness Visit. I appreciate your ongoing commitment to your health goals. Please review the following plan we discussed and let me know if I can assist you in the future.   Screening recommendations/referrals: Colonoscopy: done 06/25/16 repeat in 2023 Mammogram: done 07/01/17. Please call [phone] to schedule your mammogram.  Bone Density: done 09/28/17 Recommended yearly ophthalmology/optometry visit for glaucoma screening and checkup Recommended yearly dental visit for hygiene and checkup  Vaccinations: Influenza vaccine: [facility] Pneumococcal vaccine: done 08/14/14 Tdap vaccine: done 08/19/17 Shingles vaccine: Shingrix discussed. Please contact your pharmacy for coverage information.     Advanced directives: Please bring a copy of your health care power of attorney and living will to the office at your convenience.  Conditions/risks identified: Recommend increasing physical activity when tolerate and released from physical therpay.  Next appointment: 04/12/18 1:40 Dr. Carlynn Purl   Preventive Care 72 Years and Older, Female Preventive care refers to lifestyle choices and visits with your health care provider that can promote health and wellness. What does preventive care include?  A yearly physical exam. This is also called an annual well check.  Dental exams once or twice a year.  Routine eye exams. Ask your health care provider how often you should have your eyes checked.  Personal lifestyle choices, including:  Daily care of your teeth and gums.  Regular physical activity.  Eating a healthy diet.  Avoiding tobacco and drug use.  Limiting alcohol use.  Practicing safe sex.  Taking low-dose aspirin every day.  Taking vitamin and mineral supplements as recommended by your health care provider. What happens during an annual well check? The services and screenings done by your health care  provider during your annual well check will depend on your age, overall health, lifestyle risk factors, and family history of disease. Counseling  Your health care provider may ask you questions about your:  Alcohol use.  Tobacco use.  Drug use.  Emotional well-being.  Home and relationship well-being.  Sexual activity.  Eating habits.  History of falls.  Memory and ability to understand (cognition).  Work and work Astronomer.  Reproductive health. Screening  You may have the following tests or measurements:  Height, weight, and BMI.  Blood pressure.  Lipid and cholesterol levels. These may be checked every 5 years, or more frequently if you are over 48 years old.  Skin check.  Lung cancer screening. You may have this screening every year starting at age 72 if you have a 30-pack-year history of smoking and currently smoke or have quit within the past 15 years.  Fecal occult blood test (FOBT) of the stool. You may have this test every year starting at age 72.  Flexible sigmoidoscopy or colonoscopy. You may have a sigmoidoscopy every 5 years or a colonoscopy every 10 years starting at age 72.  Hepatitis C blood test.  Hepatitis B blood test.  Sexually transmitted disease (STD) testing.  Diabetes screening. This is done by checking your blood sugar (glucose) after you have not eaten for a while (fasting). You may have this done every 1-3 years.  Bone density scan. This is done to screen for osteoporosis. You may have this done starting at age 72.  Mammogram. This may be done every 1-2 years. Talk to your health care provider about how often you should have regular mammograms. Talk with your health care provider about your test results, treatment options, and if necessary,  the need for more tests. Vaccines  Your health care provider may recommend certain vaccines, such as:  Influenza vaccine. This is recommended every year.  Tetanus, diphtheria, and acellular  pertussis (Tdap, Td) vaccine. You may need a Td booster every 10 years.  Zoster vaccine. You may need this after age 72.  Pneumococcal 13-valent conjugate (PCV13) vaccine. One dose is recommended after age 72.  Pneumococcal polysaccharide (PPSV23) vaccine. One dose is recommended after age 72. Talk to your health care provider about which screenings and vaccines you need and how often you need them. This information is not intended to replace advice given to you by your health care provider. Make sure you discuss any questions you have with your health care provider. Document Released: 03/16/2015 Document Revised: 11/07/2015 Document Reviewed: 12/19/2014 Elsevier Interactive Patient Education  2017 [place] Prevention in the Home Falls can cause injuries. They can happen to people of all ages. There are many things you can do to make your home safe and to help prevent falls. What can I do on the outside of my home?  Regularly fix the edges of walkways and driveways and fix any cracks.  Remove anything that might make you trip as you walk through a door, such as a raised step or threshold.  Trim any bushes or trees on the path to your home.  Use bright outdoor lighting.  Clear any walking paths of anything that might make someone trip, such as rocks or tools.  Regularly check to see if handrails are loose or broken. Make sure that both sides of any steps have handrails.  Any raised decks and porches should have guardrails on the edges.  Have any leaves, snow, or ice cleared regularly.  Use sand or salt on walking paths during winter.  Clean up any spills in your garage right away. This includes oil or grease spills. What can I do in the bathroom?  Use night lights.  Install grab bars by the toilet and in the tub and shower. Do not use towel bars as grab bars.  Use non-skid mats or decals in the tub or shower.  If you need to sit down in the shower, use a plastic,  non-slip stool.  Keep the floor dry. Clean up any water that spills on the floor as soon as it happens.  Remove soap buildup in the tub or shower regularly.  Attach bath mats securely with double-sided non-slip rug tape.  Do not have throw rugs and other things on the floor that can make you trip. What can I do in the bedroom?  Use night lights.  Make sure that you have a light by your bed that is easy to reach.  Do not use any sheets or blankets that are too big for your bed. They should not hang down onto the floor.  Have a firm chair that has side arms. You can use this for support while you get dressed.  Do not have throw rugs and other things on the floor that can make you trip. What can I do in the kitchen?  Clean up any spills right away.  Avoid walking on wet floors.  Keep items that you use a lot in easy-to-reach places.  If you need to reach something above you, use a strong step stool that has a grab bar.  Keep electrical cords out of the way.  Do not use floor polish or wax that makes floors slippery. If you must use  wax, use non-skid floor wax.  Do not have throw rugs and other things on the floor that can make you trip. What can I do with my stairs?  Do not leave any items on the stairs.  Make sure that there are handrails on both sides of the stairs and use them. Fix handrails that are broken or loose. Make sure that handrails are as long as the stairways.  Check any carpeting to make sure that it is firmly attached to the stairs. Fix any carpet that is loose or worn.  Avoid having throw rugs at the top or bottom of the stairs. If you do have throw rugs, attach them to the floor with carpet tape.  Make sure that you have a light switch at the top of the stairs and the bottom of the stairs. If you do not have them, ask someone to add them for you. What else can I do to help prevent falls?  Wear shoes that:  Do not have high heels.  Have rubber  bottoms.  Are comfortable and fit you well.  Are closed at the toe. Do not wear sandals.  If you use a stepladder:  Make sure that it is fully opened. Do not climb a closed stepladder.  Make sure that both sides of the stepladder are locked into place.  Ask someone to hold it for you, if possible.  Clearly mark and make sure that you can see:  Any grab bars or handrails.  First and last steps.  Where the edge of each step is.  Use tools that help you move around (mobility aids) if they are needed. These include:  Canes.  Walkers.  Scooters.  Crutches.  Turn on the lights when you go into a dark area. Replace any light bulbs as soon as they burn out.  Set up your furniture so you have a clear path. Avoid moving your furniture around.  If any of your floors are uneven, fix them.  If there are any pets around you, be aware of where they are.  Review your medicines with your doctor. Some medicines can make you feel dizzy. This can increase your chance of falling. Ask your doctor what other things that you can do to help prevent falls. This information is not intended to replace advice given to you by your health care provider. Make sure you discuss any questions you have with your health care provider. Document Released: 12/14/2008 Document Revised: 07/26/2015 Document Reviewed: 03/24/2014 Elsevier Interactive Patient Education  2017 [employer].

## 2018-03-09 NOTE — Progress Notes (Signed)
Subjective:   Lisa Steele is a 73 y.o. female who presents for Medicare Annual (Subsequent) preventive examination.  Review of Systems:  Cardiac Risk Factors include: advanced age (>43men, >45 women);dyslipidemia;hypertension;obesity (BMI >30kg/m2)     Objective:     Vitals: BP 110/68 (BP Location: Right Arm, Patient Position: Sitting, Cuff Size: Large)   Pulse 76   Temp 97.6 F (36.4 C) (Oral)   Resp 16   Ht 5\' 3"  (1.6 m)   Wt 203 lb 3.2 oz (92.2 kg)   SpO2 98%   BMI 36.00 kg/m   Body mass index is 36 kg/m.  Advanced Directives 03/09/2018 11/23/2017 08/15/2016 06/25/2016 02/14/2016 08/08/2015 02/13/2015  Does Patient Have a Medical Advance Directive? Yes Yes Yes Yes Yes Yes Yes  Type of Estate agent of [place];Living will Healthcare Power of [place];Living will Healthcare Power of [place];Living will Living will Healthcare Power of [place];Living will Healthcare Power of [place];Living will Living will  Does patient want to make changes to medical advance directive? - No - Patient declined - - - No - Patient declined -  Copy of Healthcare Power of Attorney in Chart? No - copy requested No - copy requested - - No - copy requested - No - copy requested    Tobacco Social History   Tobacco Use  Smoking Status Former Smoker  . Years: 10.00  . Types: Cigarettes  . Start date: 03/03/1958  Smokeless Tobacco Never Used     Counseling given: Not Answered   Clinical Intake:  Pre-visit preparation completed: Yes  Pain : 0-10 Pain Score: 8 (only when raising left arm due to rotator cuff injury) Pain Type: Chronic pain Pain Location: Shoulder Pain Orientation: Left Pain Descriptors / Indicators: Aching Pain Onset: More than a month ago Pain Frequency: Intermittent     Nutritional Status: BMI > 30  Obese Nutritional Risks: None Diabetes: No  How often do you need to have someone help you when you read instructions, pamphlets, or other written  materials from your doctor or pharmacy?: 1 - Never What is the last grade level you completed in school?: 2 years college  Interpreter Needed?: No  Information entered by :: Reather Littler LPN  Past Medical History:  Diagnosis Date  . Cataracts, bilateral   . Hyperlipidemia   . Hypertension    Past Surgical History:  Procedure Laterality Date  . APPENDECTOMY    . BREAST EXCISIONAL BIOPSY Left 1970   neg  . BREAST SURGERY Left    cyst removed  . BUNIONECTOMY    . CATARACT EXTRACTION, BILATERAL  10/2013  . COLONOSCOPY WITH PROPOFOL N/A 06/25/2016   Procedure: COLONOSCOPY WITH PROPOFOL;  Surgeon: Scot Jun, MD;  Location: [facility] ENDOSCOPY;  Service: Endoscopy;  Laterality: N/A;  . ECTOPIC PREGNANCY SURGERY    . EYE SURGERY    . SHOULDER ARTHROSCOPY WITH ROTATOR CUFF REPAIR AND SUBACROMIAL DECOMPRESSION Left 11/25/2017   Procedure: SHOULDER ARTHROSCOPic release of long head biceps tendon and mini open rotator cuff repair;  Surgeon: Erin Sons, MD;  Location: [facility] ORS;  Service: Orthopedics;  Laterality: Left;  . TONSILECTOMY, ADENOIDECTOMY, BILATERAL MYRINGOTOMY AND TUBES    . TONSILLECTOMY     Family History  Problem Relation Age of Onset  . Diabetes Mother   . Hypertension Mother   . Heart disease Mother   . Hyperlipidemia Mother   . Heart disease Father   . Diabetes Sister   . Heart disease Sister   . Hypertension Sister   .  Hyperlipidemia Sister   . Breast cancer Sister [phone]  . Cancer Sister [phone]       breast  . Breast cancer Maternal Aunt   . Diabetes Sister   . Diabetes Sister   . Diabetes Sister    Social History   Socioeconomic History  . Marital status: Married    Spouse name: Chales Abrahamsyson   . Number of children: 1  . Years of education: Not on file  . Highest education level: Associate degree: academic program  Occupational History  . Occupation: Theatre stage managermeeting planner     Comment: [employer]   Social Needs  . Financial resource strain: Not  hard at all  . Food insecurity:    Worry: Never true    Inability: Never true  . Transportation needs:    Medical: No    Non-medical: No  Tobacco Use  . Smoking status: Former Smoker    Years: 10.00    Types: Cigarettes    Start date: 03/03/1958  . Smokeless tobacco: Never Used  Substance and Sexual Activity  . Alcohol use: Yes    Alcohol/week: 0.0 standard drinks    Comment: socially  . Drug use: No  . Sexual activity: Not Currently    Comment: husband has ED  Lifestyle  . Physical activity:    Days per week: 0 days    Minutes per session: 0 min  . Stress: Only a little  Relationships  . Social connections:    Talks on phone: More than three times a week    Gets together: More than three times a week    Attends religious service: More than 4 times per year    Active member of club or organization: Yes    Attends meetings of clubs or organizations: More than 4 times per year    Relationship status: Married  Other Topics Concern  . Not on file  Social History Narrative   Married, son lives in [place] and has 3 grandchildren , youngest born 10/2016    Outpatient Encounter Medications as of 03/09/2018  Medication Sig  . aspirin EC 81 MG tablet Take 81 mg by mouth daily.  Marland Kitchen. olmesartan-hydrochlorothiazide (BENICAR HCT) 40-25 MG tablet TAKE 1 TABLET BY MOUTH EVERY DAY  . rosuvastatin (CRESTOR) 10 MG tablet TAKE 1 TABLET BY MOUTH  DAILY  . [DISCONTINUED] HYDROcodone-acetaminophen (NORCO/VICODIN) 5-325 MG tablet Take 1 tablet by mouth every 6 (six) hours as needed for moderate pain.   No facility-administered encounter medications on file as of 03/09/2018.     Activities of Daily Living In your present state of health, do you have any difficulty performing the following activities: 03/09/2018 11/25/2017  Hearing? N N  Comment declines hearing aids -  Vision? N N  Comment wears glasses -  Difficulty concentrating or making decisions? N N  Walking or climbing stairs? N -    Dressing or bathing? N N  Doing errands, shopping? N -  Preparing Food and eating ? N -  Using the Toilet? N -  In the past six months, have you accidently leaked urine? N -  Do you have problems with loss of bowel control? N -  Managing your Medications? N -  Managing your Finances? N -  Housekeeping or managing your Housekeeping? N -  Some recent data might be hidden    Patient Care Team: Alba CorySowles, Krichna, MD as PCP - General (Family Medicine) Alwyn Peaallwood, Dwayne D, MD as Consulting Physician (Cardiology) Scot JunElliott, Robert T, MD as Consulting  Physician (Gastroenterology) Kennedy BuckerMenz, Michael, MD as Consulting Physician (Orthopedic Surgery)    Assessment:   This is a routine wellness examination for Lisa CalicoFrances.  Exercise Activities and Dietary recommendations Current Exercise Habits: The patient does not participate in regular exercise at present(advised not to exercise other than current physical therapy), Exercise limited by: orthopedic condition(s)  Goals    . Weight (lb) < 200 lb (90.7 kg)     Pt would like to lose at least 10 lbs this year       Fall Risk Fall Risk  03/09/2018 02/19/2017 02/14/2016 08/08/2015 02/13/2015  Falls in the past year? 1 No No No No  Number falls in past yr: 1 - - - -  Comment slipped on pool deck - - - -  Injury with Fall? 1 - - - -  Comment left shoulder rotator cuff injury - - - -  Follow up Falls evaluation completed;Falls prevention discussed - - - -   FALL RISK PREVENTION PERTAINING TO THE HOME:  Any stairs in or around the home WITH handrails? Yes  Home free of loose throw rugs in walkways, pet beds, electrical cords, etc? Yes  Adequate lighting in your home to reduce risk of falls? Yes   ASSISTIVE DEVICES UTILIZED TO PREVENT FALLS:  Life alert? No  Use of a cane, walker or w/c? No  Grab bars in the bathroom? No Shower chair or bench in shower? Yes  Elevated toilet seat or a handicapped toilet? No   DME ORDERS:  DME order needed?  No    TIMED UP AND GO:  Was the test performed? Yes .  Length of time to ambulate 10 feet: 5 sec.   GAIT:  Appearance of gait: Gait stead-fast and without the use of an assistive device. Education: Fall risk prevention has been discussed.  Intervention(s) required? No   Depression Screen PHQ 2/9 Scores 03/09/2018 02/19/2017 02/14/2016 08/08/2015  PHQ - 2 Score 0 0 0 0     Cognitive Function     6CIT Screen 03/09/2018  What Year? 0 points  What month? 0 points  What time? 0 points  Count back from 20 0 points  Months in reverse 0 points  Repeat phrase 0 points  Total Score 0    Immunization History  Administered Date(s) Administered  . Influenza, High Dose Seasonal PF 02/14/2016, 11/12/2016  . Influenza,inj,Quad PF,6+ Mos 02/10/2014, 01/29/2015  . Influenza-Unspecified 02/10/2014, 01/29/2015, 02/14/2016, 11/12/2016  . Pneumococcal Conjugate-13 08/14/2014  . Pneumococcal Polysaccharide-23 01/27/2012  . Tdap 12/27/2010, 08/19/2017  . Zoster 01/01/2011    Qualifies for Shingles Vaccine? Yes  Zostavax completed 2012. Due for Shingrix. Education has been provided regarding the importance of this vaccine. Pt has been advised to call insurance company to determine out of pocket expense. Advised may also receive vaccine at local pharmacy or Health Dept. Verbalized acceptance and understanding.  Tdap: Up to date  Flu Vaccine: Up to date   Pneumococcal Vaccine: Up to date   Screening Tests Health Maintenance  Topic Date Due  . INFLUENZA VACCINE  10/01/2017  . MAMMOGRAM  07/02/2019  . COLONOSCOPY  06/25/2021  . TETANUS/TDAP  08/20/2027  . DEXA SCAN  Completed  . Hepatitis C Screening  Completed  . PNA vac Low Risk Adult  Completed   Cancer Screenings:  Colorectal Screening: Completed 06/25/16. Repeat every 5 years;   Mammogram: Completed 07/01/17. Repeat every year;  Ordered today. Pt provided with contact information and advised to call to schedule appt.  Bone Density:  Completed 09/28/17. Results reflect NORMAL. Repeat every 2 years.   Lung Cancer Screening: (Low Dose CT Chest recommended if Age 68-80 years, 30 pack-year currently smoking OR have quit w/in 15years.) does not qualify.    Additional Screening:  Hepatitis C Screening: does qualify; Completed 01/27/12  Vision Screening: Recommended annual ophthalmology exams for early detection of glaucoma and other disorders of the eye. Is the patient up to date with their annual eye exam?  Yes  Who is the provider or what is the name of the office in which the pt attends annual eye exams? Dr. Shelah Lewandowsky at [facility]  Dental Screening: Recommended annual dental exams for proper oral hygiene  Community Resource Referral:  [facility] required this visit?  No      Plan:     I have personally reviewed and addressed the Medicare Annual Wellness questionnaire and have noted the following in the patient's chart:  A. Medical and social history B. Use of alcohol, tobacco or illicit drugs  C. Current medications and supplements D. Functional ability and status E.  Nutritional status F.  Physical activity G. Advance directives H. List of other physicians I.  Hospitalizations, surgeries, and ER visits in previous 12 months J.  Vitals K. Screenings such as hearing and vision if needed, cognitive and depression L. Referrals and appointments   In addition, I have reviewed and discussed with patient certain preventive protocols, quality metrics, and best practice recommendations. A written personalized care plan for preventive services as well as general preventive health recommendations were provided to patient.   Signed,  Reather Littler, LPN Nurse Health Advisor   Nurse Notes:refill request sent for benicar. Pt has upcoming follow up appt.

## 2018-03-10 ENCOUNTER — Other Ambulatory Visit: Payer: Self-pay

## 2018-03-10 DIAGNOSIS — M25612 Stiffness of left shoulder, not elsewhere classified: Secondary | ICD-10-CM | POA: Diagnosis not present

## 2018-03-10 DIAGNOSIS — M25512 Pain in left shoulder: Secondary | ICD-10-CM | POA: Diagnosis not present

## 2018-03-10 DIAGNOSIS — M6281 Muscle weakness (generalized): Secondary | ICD-10-CM | POA: Diagnosis not present

## 2018-03-10 DIAGNOSIS — Z9889 Other specified postprocedural states: Secondary | ICD-10-CM | POA: Diagnosis not present

## 2018-03-10 DIAGNOSIS — R29898 Other symptoms and signs involving the musculoskeletal system: Secondary | ICD-10-CM | POA: Diagnosis not present

## 2018-03-10 DIAGNOSIS — I10 Essential (primary) hypertension: Secondary | ICD-10-CM

## 2018-03-10 MED ORDER — OLMESARTAN MEDOXOMIL-HCTZ 40-25 MG PO TABS: 1.0000 | ORAL_TABLET | Freq: Every day | ORAL | 0 refills | Status: DC

## 2018-03-10 NOTE — Telephone Encounter (Signed)
Hypertension medication request: Benicar HCT to [facility]  Last office visit pertaining to hypertension: 08/19/2017    BP Readings from Last 3 Encounters:  03/09/18 110/68  11/25/17 (!) 122/56  11/23/17 (!) 168/85    Lab Results  Component Value Date   CREATININE 0.82 11/23/2017   BUN 15 11/23/2017   NA 138 11/23/2017   K 3.5 11/23/2017   CL 100 11/23/2017   CO2 27 11/23/2017     Follow up on 04/12/2018

## 2018-03-15 DIAGNOSIS — M6281 Muscle weakness (generalized): Secondary | ICD-10-CM | POA: Diagnosis not present

## 2018-03-15 DIAGNOSIS — M25612 Stiffness of left shoulder, not elsewhere classified: Secondary | ICD-10-CM | POA: Diagnosis not present

## 2018-03-15 DIAGNOSIS — M25512 Pain in left shoulder: Secondary | ICD-10-CM | POA: Diagnosis not present

## 2018-03-15 DIAGNOSIS — Z9889 Other specified postprocedural states: Secondary | ICD-10-CM | POA: Diagnosis not present

## 2018-03-15 DIAGNOSIS — R29898 Other symptoms and signs involving the musculoskeletal system: Secondary | ICD-10-CM | POA: Diagnosis not present

## 2018-03-24 DIAGNOSIS — M6281 Muscle weakness (generalized): Secondary | ICD-10-CM | POA: Diagnosis not present

## 2018-03-24 DIAGNOSIS — R29898 Other symptoms and signs involving the musculoskeletal system: Secondary | ICD-10-CM | POA: Diagnosis not present

## 2018-03-24 DIAGNOSIS — M25512 Pain in left shoulder: Secondary | ICD-10-CM | POA: Diagnosis not present

## 2018-03-24 DIAGNOSIS — M25612 Stiffness of left shoulder, not elsewhere classified: Secondary | ICD-10-CM | POA: Diagnosis not present

## 2018-03-24 DIAGNOSIS — Z9889 Other specified postprocedural states: Secondary | ICD-10-CM | POA: Diagnosis not present

## 2018-03-31 DIAGNOSIS — R29898 Other symptoms and signs involving the musculoskeletal system: Secondary | ICD-10-CM | POA: Diagnosis not present

## 2018-03-31 DIAGNOSIS — M6281 Muscle weakness (generalized): Secondary | ICD-10-CM | POA: Diagnosis not present

## 2018-03-31 DIAGNOSIS — M25512 Pain in left shoulder: Secondary | ICD-10-CM | POA: Diagnosis not present

## 2018-03-31 DIAGNOSIS — Z9889 Other specified postprocedural states: Secondary | ICD-10-CM | POA: Diagnosis not present

## 2018-03-31 DIAGNOSIS — M25612 Stiffness of left shoulder, not elsewhere classified: Secondary | ICD-10-CM | POA: Diagnosis not present

## 2018-04-05 DIAGNOSIS — M25612 Stiffness of left shoulder, not elsewhere classified: Secondary | ICD-10-CM | POA: Diagnosis not present

## 2018-04-05 DIAGNOSIS — M6281 Muscle weakness (generalized): Secondary | ICD-10-CM | POA: Diagnosis not present

## 2018-04-05 DIAGNOSIS — R29898 Other symptoms and signs involving the musculoskeletal system: Secondary | ICD-10-CM | POA: Diagnosis not present

## 2018-04-05 DIAGNOSIS — M25512 Pain in left shoulder: Secondary | ICD-10-CM | POA: Diagnosis not present

## 2018-04-05 DIAGNOSIS — Z9889 Other specified postprocedural states: Secondary | ICD-10-CM | POA: Diagnosis not present

## 2018-04-07 DIAGNOSIS — Z9889 Other specified postprocedural states: Secondary | ICD-10-CM | POA: Diagnosis not present

## 2018-04-07 DIAGNOSIS — M25612 Stiffness of left shoulder, not elsewhere classified: Secondary | ICD-10-CM | POA: Diagnosis not present

## 2018-04-07 DIAGNOSIS — M25512 Pain in left shoulder: Secondary | ICD-10-CM | POA: Diagnosis not present

## 2018-04-07 DIAGNOSIS — R29898 Other symptoms and signs involving the musculoskeletal system: Secondary | ICD-10-CM | POA: Diagnosis not present

## 2018-04-07 DIAGNOSIS — M6281 Muscle weakness (generalized): Secondary | ICD-10-CM | POA: Diagnosis not present

## 2018-04-12 ENCOUNTER — Ambulatory Visit (INDEPENDENT_AMBULATORY_CARE_PROVIDER_SITE_OTHER): Payer: Medicare Other | Admitting: Family Medicine

## 2018-04-12 ENCOUNTER — Encounter: Payer: Self-pay | Admitting: Family Medicine

## 2018-04-12 DIAGNOSIS — R1031 Right lower quadrant pain: Secondary | ICD-10-CM

## 2018-04-12 DIAGNOSIS — M25551 Pain in right hip: Secondary | ICD-10-CM

## 2018-04-12 DIAGNOSIS — G8929 Other chronic pain: Secondary | ICD-10-CM | POA: Diagnosis not present

## 2018-04-12 DIAGNOSIS — E785 Hyperlipidemia, unspecified: Secondary | ICD-10-CM

## 2018-04-12 DIAGNOSIS — I10 Essential (primary) hypertension: Secondary | ICD-10-CM

## 2018-04-12 DIAGNOSIS — Z9889 Other specified postprocedural states: Secondary | ICD-10-CM

## 2018-04-12 DIAGNOSIS — M65312 Trigger thumb, left thumb: Secondary | ICD-10-CM

## 2018-04-12 DIAGNOSIS — R7301 Impaired fasting glucose: Secondary | ICD-10-CM | POA: Diagnosis not present

## 2018-04-12 DIAGNOSIS — R590 Localized enlarged lymph nodes: Secondary | ICD-10-CM | POA: Diagnosis not present

## 2018-04-12 DIAGNOSIS — R29898 Other symptoms and signs involving the musculoskeletal system: Secondary | ICD-10-CM | POA: Diagnosis not present

## 2018-04-12 DIAGNOSIS — M25512 Pain in left shoulder: Secondary | ICD-10-CM | POA: Diagnosis not present

## 2018-04-12 DIAGNOSIS — M25612 Stiffness of left shoulder, not elsewhere classified: Secondary | ICD-10-CM | POA: Diagnosis not present

## 2018-04-12 DIAGNOSIS — M6281 Muscle weakness (generalized): Secondary | ICD-10-CM | POA: Diagnosis not present

## 2018-04-12 MED ORDER — OLMESARTAN MEDOXOMIL-HCTZ 40-25 MG PO TABS: 1.0000 | ORAL_TABLET | Freq: Every day | ORAL | 1 refills | Status: DC

## 2018-04-12 NOTE — Progress Notes (Signed)
Name: Lisa Steele   MRN: [id number]    DOB: 1945/09/15   Date:04/12/2018       Progress Note  Subjective  Chief Complaint  Chief Complaint  Patient presents with  . Hypertension  . Dyslipidemia  . Muscle Pain    pulled muscle x 1 week in groin area. Pain when walking    HPI  Morbid obesity: she has lost 7 lbs since 03/2018 however her weight is higher than last year. Up from 08/2017. She was on vacation and slipped and fell, injured her left rotator cuff and had surgery 11/2017 still getting PT and not able to exercise yet  Right groin pain: she has a history of right hip pain , however was doing well until last week. She states she got up to walk and noticed a dull aching sensation on right anterior hip. She states only bothers her when walking, no pain when sitting or standing. No redness or increase in warmth but she feels there is bulge in the area.   HTN: taking bp medication daily, she denies chest pain , SOB or palpitation.Denies dizziness. She has been very worried about medication recalls, and is considering switching to branded medication, but for now agrees in asking for [place] manufacture  of generic medication if possible.   Hyperlipidemia: taking Crestor, no myalgias, compliant with medications.BP today showed SBP 140 but DBP at goal, she states usually well controlled. She just came in from PT   Right hip pain: [facility] She had MRI, showed OA, she is doing better, at times gets stiff when she gets up, but otherwise no problems. She states pain on her groin is different this times  Elevated glucose: family history of DM, previous hyperglycemia on labs, last hgbA1C was normal, but elevated fasting insulin, lost weight since last visit.We will continue to monitor for now   Patient Active Problem List   Diagnosis Date Noted  . Trigger finger of left thumb 03/04/2018  . Left hand pain 02/03/2018  . S/P rotator cuff repair  01/12/2018  . Injury of left rotator cuff 11/03/2017  . Morbid obesity due to excess calories (HCC) 02/19/2017  . Primary osteoarthritis of both knees 06/22/2014  . Obesity (BMI 30-39.9) 06/22/2014  . Dysmetabolic syndrome 06/22/2014  . Post-menopausal 06/22/2014  . H/O cold sores 06/22/2014  . Dyslipidemia 06/22/2014  . Elevated fasting blood sugar 06/22/2014  . Benign essential HTN 06/22/2014  . Abnormal ECG 06/22/2014    Past Surgical History:  Procedure Laterality Date  . APPENDECTOMY    . BREAST EXCISIONAL BIOPSY Left 1970   neg  . BREAST SURGERY Left    cyst removed  . BUNIONECTOMY    . CATARACT EXTRACTION, BILATERAL  10/2013  . COLONOSCOPY WITH PROPOFOL N/A 06/25/2016   Procedure: COLONOSCOPY WITH PROPOFOL;  Surgeon: Scot Jun, MD;  Location[facility] ENDOSCOPY;  Service: Endoscopy;  Laterality: N/A;  . ECTOPIC PREGNANCY SURGERY    . EYE SURGERY    . SHOULDER ARTHROSCOPY WITH ROTATOR CUFF REPAIR AND SUBACROMIAL DECOMPRESSION Left 11/25/2017   Procedure: SHOULDER ARTHROSCOPic release of long head biceps tendon and mini open rotator cuff repair;  Surgeon: Erin Sons, MD;  Location: [facility] ORS;  Service: Orthopedics;  Laterality: Left;  . TONSILECTOMY, ADENOIDECTOMY, BILATERAL MYRINGOTOMY AND TUBES    . TONSILLECTOMY      Family History  Problem Relation Age of Onset  . Diabetes Mother   . Hypertension Mother   . Heart disease Mother   .  Hyperlipidemia Mother   . Heart disease Father   . Diabetes Sister   . Heart disease Sister   . Hypertension Sister   . Hyperlipidemia Sister   . Breast cancer Sister [phone]  . Cancer Sister [phone]       breast  . Breast cancer Maternal Aunt   . Diabetes Sister   . Diabetes Sister   . Diabetes Sister     Social History   Socioeconomic History  . Marital status: Married    Spouse name: Chales Abrahamsyson   . Number of children: 1  . Years of education: Not on file  . Highest education level: Associate degree: academic program   Occupational History  . Occupation: Theatre stage managermeeting planner     Comment: [employer]   Social Needs  . Financial resource strain: Not hard at all  . Food insecurity:    Worry: Never true    Inability: Never true  . Transportation needs:    Medical: No    Non-medical: No  Tobacco Use  . Smoking status: Former Smoker    Years: 10.00    Types: Cigarettes    Start date: 03/03/1958  . Smokeless tobacco: Never Used  Substance and Sexual Activity  . Alcohol use: Yes    Alcohol/week: 0.0 standard drinks    Comment: socially  . Drug use: No  . Sexual activity: Not Currently    Comment: husband has ED  Lifestyle  . Physical activity:    Days per week: 0 days    Minutes per session: 0 min  . Stress: Only a little  Relationships  . Social connections:    Talks on phone: More than three times a week    Gets together: More than three times a week    Attends religious service: More than 4 times per year    Active member of club or organization: Yes    Attends meetings of clubs or organizations: More than 4 times per year    Relationship status: Married  . Intimate partner violence:    Fear of current or ex partner: No    Emotionally abused: No    Physically abused: No    Forced sexual activity: No  Other Topics Concern  . Not on file  Social History Narrative   Married, son lives in [place] and has 3 grandchildren , youngest born 10/2016     Current Outpatient Medications:  .  aspirin EC 81 MG tablet, Take 81 mg by mouth daily., Disp: , Rfl:  .  olmesartan-hydrochlorothiazide (BENICAR HCT) 40-25 MG tablet, Take 1 tablet by mouth daily., Disp: 90 tablet, Rfl: 1 .  rosuvastatin (CRESTOR) 10 MG tablet, TAKE 1 TABLET BY MOUTH  DAILY, Disp: 90 tablet, Rfl: 1  Allergies  Allergen Reactions  . Tramadol Nausea And Vomiting  . Doxycycline Itching  . Penicillins Hives and Rash    leg lesions and weakness (couldn't walk)    I personally reviewed active problem list,  medication list, allergies, family history, social history with the patient/caregiver today.   ROS  Constitutional: Negative for fever or weight change.  Respiratory: Negative for cough and shortness of breath.   Cardiovascular: Negative for chest pain or palpitations.  Gastrointestinal: Negative for abdominal pain, no bowel changes.  Musculoskeletal: Negative for gait problem or joint swelling.  Skin: Negative for rash.  Neurological: Negative for dizziness or headache.  No other specific complaints in a complete review of systems (except as listed in HPI above).  Objective  Vitals:   04/12/18 1414  BP: 140/78  Pulse: 73  Resp: 16  Temp: (!) 97.4 F (36.3 C)  TempSrc: Oral  SpO2: 98%  Weight: 196 lb 3.2 oz (89 kg)  Height: 5\' 3"  (1.6 m)    Body mass index is 34.76 kg/m.  Physical Exam  Constitutional: Patient appears well-developed and well-nourished. Obese  No distress.  HEENT: head atraumatic, normocephalic, pupils equal and reactive to light neck supple, throat within normal limits Cardiovascular: Normal rate, regular rhythm and normal heart sounds.  No murmur heard. No BLE edema. Pulmonary/Chest: Effort normal and breath sounds normal. No respiratory distress. Abdominal: Soft.  There is no tenderness, she has tender lymphadenopathy right groin, but not on the left side, normal rom of right hip but pain with internal rotation Psychiatric: Patient has a normal mood and affect. behavior is normal. Judgment and thought content normal.  PHQ2/9: Depression screen [facility] 04/12/2018 03/09/2018 02/19/2017 02/14/2016 08/08/2015  Decreased Interest 0 0 0 0 0  Down, Depressed, Hopeless 0 0 0 0 0  PHQ - 2 Score 0 0 0 0 0     Fall Risk: Fall Risk  04/12/2018 03/09/2018 02/19/2017 02/14/2016 08/08/2015  Falls in the past year? 0 1 No No No  Number falls in past yr: 0 1 - - -  Comment - slipped on pool deck - - -  Injury with Fall? 0 1 - - -  Comment - left shoulder rotator cuff  injury - - -  Follow up - Falls evaluation completed;Falls prevention discussed - - -      Assessment & Plan  1. Benign essential HTN  - olmesartan-hydrochlorothiazide (BENICAR HCT) 40-25 MG tablet; Take 1 tablet by mouth daily.  Dispense: 90 tablet; Refill: 1 - COMPLETE METABOLIC PANEL WITH GFR - CBC with Differential/Platelet  2. Dyslipidemia  - Lipid panel  3. Morbid obesity due to excess calories [facility](HCC)  Discussed with the patient the risk posed by an increased BMI. Discussed importance of portion control, calorie counting and at least 150 minutes of physical activity weekly. Avoid sweet beverages and drink more water. Eat at least 6 servings of fruit and vegetables daily   4. Chronic right hip pain  Stable  5. Elevated fasting blood sugar  - Hemoglobin A1c   6. Trigger thumb, left thumb  Discuss with ortho   7. History of repair of left rotator cuff  Getting PT  8. Lymphadenopathy, inguinal  - US SOFT TISSUE LOWER EXTREMITY LIMITED RIGHT (NON-VASCULAR); Future  9. Right inguinal pain  - US SOFT TISSUE LOWER EXTREMITY LIMITED RIGHT (NON-VASCULAR); Future

## 2018-04-13 LAB — CBC WITH DIFFERENTIAL/PLATELET
Absolute Monocytes: 494 cells/uL (ref 200–950)
BASOS ABS: 62 {cells}/uL (ref 0–200)
Basophils Relative: 1.3 %
EOS PCT: 0.8 %
Eosinophils Absolute: 38 cells/uL (ref 15–500)
HCT: 43.3 % (ref 35.0–45.0)
Hemoglobin: 14.3 g/dL (ref 11.7–15.5)
Lymphs Abs: 2256 cells/uL (ref 850–3900)
MCH: 27.6 pg (ref 27.0–33.0)
MCHC: 33 g/dL (ref 32.0–36.0)
MCV: 83.4 fL (ref 80.0–100.0)
MONOS PCT: 10.3 %
MPV: 11.6 fL (ref 7.5–12.5)
NEUTROS PCT: 40.6 %
Neutro Abs: 1949 cells/uL (ref 1500–7800)
PLATELETS: 378 10*3/uL (ref 140–400)
RBC: 5.19 10*6/uL — ABNORMAL HIGH (ref 3.80–5.10)
RDW: 14.3 % (ref 11.0–15.0)
Total Lymphocyte: 47 %
WBC: 4.8 10*3/uL (ref 3.8–10.8)

## 2018-04-13 LAB — LIPID PANEL
CHOLESTEROL: 155 mg/dL (ref <200)
HDL: 55 mg/dL (ref >=50)
LDL Cholesterol (Calc): 82 mg/dL (calc)
Non-HDL Cholesterol (Calc): 100 mg/dL (calc) (ref <130)
TRIGLYCERIDES: 88 mg/dL (ref <150)
Total CHOL/HDL Ratio: 2.8 (calc) (ref <5.0)

## 2018-04-13 LAB — COMPLETE METABOLIC PANEL WITH GFR
AG Ratio: 1.6 (calc) (ref 1.0–2.5)
ALBUMIN MSPROF: 4.6 g/dL (ref 3.6–5.1)
ALKALINE PHOSPHATASE (APISO): 63 U/L (ref 37–153)
ALT: 14 U/L (ref 6–29)
AST: 21 U/L (ref 10–35)
BUN: 11 mg/dL (ref 7–25)
CO2: 25 mmol/L (ref 20–32)
CREATININE: 0.88 mg/dL (ref 0.60–0.93)
Calcium: 10 mg/dL (ref 8.6–10.4)
Chloride: 103 mmol/L (ref 98–110)
GFR, EST AFRICAN AMERICAN: 77 mL/min/{1.73_m2} (ref >=60)
GFR, Est Non African American: 66 mL/min/{1.73_m2} (ref >=60)
GLUCOSE: 101 mg/dL — AB (ref 65–99)
Globulin: 2.8 g/dL (calc) (ref 1.9–3.7)
Potassium: 3.9 mmol/L (ref 3.5–5.3)
Sodium: 141 mmol/L (ref 135–146)
Total Bilirubin: 0.6 mg/dL (ref 0.2–1.2)
Total Protein: 7.4 g/dL (ref 6.1–8.1)

## 2018-04-13 LAB — HEMOGLOBIN A1C
HEMOGLOBIN A1C: 4.9 %{Hb} (ref <5.7)
MEAN PLASMA GLUCOSE: 94 (calc)
eAG (mmol/L): 5.2 (calc)

## 2018-04-14 DIAGNOSIS — Z9889 Other specified postprocedural states: Secondary | ICD-10-CM | POA: Diagnosis not present

## 2018-04-14 DIAGNOSIS — M25612 Stiffness of left shoulder, not elsewhere classified: Secondary | ICD-10-CM | POA: Diagnosis not present

## 2018-04-14 DIAGNOSIS — M6281 Muscle weakness (generalized): Secondary | ICD-10-CM | POA: Diagnosis not present

## 2018-04-14 DIAGNOSIS — R29898 Other symptoms and signs involving the musculoskeletal system: Secondary | ICD-10-CM | POA: Diagnosis not present

## 2018-04-14 DIAGNOSIS — M25512 Pain in left shoulder: Secondary | ICD-10-CM | POA: Diagnosis not present

## 2018-04-15 ENCOUNTER — Telehealth: Payer: Self-pay

## 2018-04-15 DIAGNOSIS — M65312 Trigger thumb, left thumb: Secondary | ICD-10-CM | POA: Diagnosis not present

## 2018-04-15 DIAGNOSIS — Z9889 Other specified postprocedural states: Secondary | ICD-10-CM | POA: Diagnosis not present

## 2018-04-15 NOTE — Telephone Encounter (Signed)
Copied from CRM [phone]. Topic: General - Other >> Apr 14, 2018  4:48 PM Jilda Rocheemaray, Melissa wrote: Reason for CRM: patient called back to speak to someone about the call that was made to her husband about her ultrasound appt for 2/17, Melissa states to put in CRM and someone will call her back on 04/15/18

## 2018-04-19 ENCOUNTER — Ambulatory Visit: Payer: Medicare Other

## 2018-04-19 DIAGNOSIS — M6281 Muscle weakness (generalized): Secondary | ICD-10-CM | POA: Diagnosis not present

## 2018-04-19 DIAGNOSIS — M25612 Stiffness of left shoulder, not elsewhere classified: Secondary | ICD-10-CM | POA: Diagnosis not present

## 2018-04-19 DIAGNOSIS — Z9889 Other specified postprocedural states: Secondary | ICD-10-CM | POA: Diagnosis not present

## 2018-04-19 DIAGNOSIS — R29898 Other symptoms and signs involving the musculoskeletal system: Secondary | ICD-10-CM | POA: Diagnosis not present

## 2018-04-19 DIAGNOSIS — M25512 Pain in left shoulder: Secondary | ICD-10-CM | POA: Diagnosis not present

## 2018-04-21 DIAGNOSIS — M25612 Stiffness of left shoulder, not elsewhere classified: Secondary | ICD-10-CM | POA: Diagnosis not present

## 2018-04-21 DIAGNOSIS — R29898 Other symptoms and signs involving the musculoskeletal system: Secondary | ICD-10-CM | POA: Diagnosis not present

## 2018-04-21 DIAGNOSIS — M25512 Pain in left shoulder: Secondary | ICD-10-CM | POA: Diagnosis not present

## 2018-04-21 DIAGNOSIS — M6281 Muscle weakness (generalized): Secondary | ICD-10-CM | POA: Diagnosis not present

## 2018-04-21 DIAGNOSIS — Z9889 Other specified postprocedural states: Secondary | ICD-10-CM | POA: Diagnosis not present

## 2018-04-26 DIAGNOSIS — M25612 Stiffness of left shoulder, not elsewhere classified: Secondary | ICD-10-CM | POA: Diagnosis not present

## 2018-04-26 DIAGNOSIS — M25512 Pain in left shoulder: Secondary | ICD-10-CM | POA: Diagnosis not present

## 2018-04-26 DIAGNOSIS — M6281 Muscle weakness (generalized): Secondary | ICD-10-CM | POA: Diagnosis not present

## 2018-04-26 DIAGNOSIS — R29898 Other symptoms and signs involving the musculoskeletal system: Secondary | ICD-10-CM | POA: Diagnosis not present

## 2018-04-26 DIAGNOSIS — Z9889 Other specified postprocedural states: Secondary | ICD-10-CM | POA: Diagnosis not present

## 2018-04-28 DIAGNOSIS — M6281 Muscle weakness (generalized): Secondary | ICD-10-CM | POA: Diagnosis not present

## 2018-04-28 DIAGNOSIS — M25612 Stiffness of left shoulder, not elsewhere classified: Secondary | ICD-10-CM | POA: Diagnosis not present

## 2018-04-28 DIAGNOSIS — R29898 Other symptoms and signs involving the musculoskeletal system: Secondary | ICD-10-CM | POA: Diagnosis not present

## 2018-04-28 DIAGNOSIS — Z9889 Other specified postprocedural states: Secondary | ICD-10-CM | POA: Diagnosis not present

## 2018-04-28 DIAGNOSIS — M25512 Pain in left shoulder: Secondary | ICD-10-CM | POA: Diagnosis not present

## 2018-05-03 DIAGNOSIS — R29898 Other symptoms and signs involving the musculoskeletal system: Secondary | ICD-10-CM | POA: Diagnosis not present

## 2018-05-03 DIAGNOSIS — M25612 Stiffness of left shoulder, not elsewhere classified: Secondary | ICD-10-CM | POA: Diagnosis not present

## 2018-05-03 DIAGNOSIS — M6281 Muscle weakness (generalized): Secondary | ICD-10-CM | POA: Diagnosis not present

## 2018-05-03 DIAGNOSIS — Z9889 Other specified postprocedural states: Secondary | ICD-10-CM | POA: Diagnosis not present

## 2018-05-03 DIAGNOSIS — M25512 Pain in left shoulder: Secondary | ICD-10-CM | POA: Diagnosis not present

## 2018-05-05 DIAGNOSIS — M6281 Muscle weakness (generalized): Secondary | ICD-10-CM | POA: Diagnosis not present

## 2018-05-05 DIAGNOSIS — Z9889 Other specified postprocedural states: Secondary | ICD-10-CM | POA: Diagnosis not present

## 2018-05-05 DIAGNOSIS — R29898 Other symptoms and signs involving the musculoskeletal system: Secondary | ICD-10-CM | POA: Diagnosis not present

## 2018-05-05 DIAGNOSIS — M25612 Stiffness of left shoulder, not elsewhere classified: Secondary | ICD-10-CM | POA: Diagnosis not present

## 2018-05-05 DIAGNOSIS — M25512 Pain in left shoulder: Secondary | ICD-10-CM | POA: Diagnosis not present

## 2018-05-10 DIAGNOSIS — M25612 Stiffness of left shoulder, not elsewhere classified: Secondary | ICD-10-CM | POA: Diagnosis not present

## 2018-05-10 DIAGNOSIS — R29898 Other symptoms and signs involving the musculoskeletal system: Secondary | ICD-10-CM | POA: Diagnosis not present

## 2018-05-10 DIAGNOSIS — M6281 Muscle weakness (generalized): Secondary | ICD-10-CM | POA: Diagnosis not present

## 2018-05-10 DIAGNOSIS — M25512 Pain in left shoulder: Secondary | ICD-10-CM | POA: Diagnosis not present

## 2018-05-10 DIAGNOSIS — Z9889 Other specified postprocedural states: Secondary | ICD-10-CM | POA: Diagnosis not present

## 2018-05-12 DIAGNOSIS — M6281 Muscle weakness (generalized): Secondary | ICD-10-CM | POA: Diagnosis not present

## 2018-05-12 DIAGNOSIS — Z9889 Other specified postprocedural states: Secondary | ICD-10-CM | POA: Diagnosis not present

## 2018-05-12 DIAGNOSIS — M25612 Stiffness of left shoulder, not elsewhere classified: Secondary | ICD-10-CM | POA: Diagnosis not present

## 2018-05-12 DIAGNOSIS — M25512 Pain in left shoulder: Secondary | ICD-10-CM | POA: Diagnosis not present

## 2018-05-12 DIAGNOSIS — R29898 Other symptoms and signs involving the musculoskeletal system: Secondary | ICD-10-CM | POA: Diagnosis not present

## 2018-05-17 ENCOUNTER — Encounter: Payer: Self-pay | Admitting: Family Medicine

## 2018-05-17 DIAGNOSIS — M25512 Pain in left shoulder: Secondary | ICD-10-CM | POA: Diagnosis not present

## 2018-05-17 DIAGNOSIS — R29898 Other symptoms and signs involving the musculoskeletal system: Secondary | ICD-10-CM | POA: Diagnosis not present

## 2018-05-17 DIAGNOSIS — M6281 Muscle weakness (generalized): Secondary | ICD-10-CM | POA: Diagnosis not present

## 2018-05-17 DIAGNOSIS — M25612 Stiffness of left shoulder, not elsewhere classified: Secondary | ICD-10-CM | POA: Diagnosis not present

## 2018-05-17 DIAGNOSIS — Z9889 Other specified postprocedural states: Secondary | ICD-10-CM | POA: Diagnosis not present

## 2018-05-21 ENCOUNTER — Other Ambulatory Visit: Payer: Self-pay

## 2018-05-21 DIAGNOSIS — I10 Essential (primary) hypertension: Secondary | ICD-10-CM

## 2018-05-21 MED ORDER — OLMESARTAN MEDOXOMIL-HCTZ 40-25 MG PO TABS: 1.0000 | ORAL_TABLET | Freq: Every day | ORAL | 2 refills | Status: DC

## 2018-05-21 NOTE — Telephone Encounter (Signed)
Insurance will only cover 30 tablets per month. Prescription needs to be changed. She would like 6 month refill.

## 2018-05-24 DIAGNOSIS — R29898 Other symptoms and signs involving the musculoskeletal system: Secondary | ICD-10-CM | POA: Diagnosis not present

## 2018-05-24 DIAGNOSIS — M6281 Muscle weakness (generalized): Secondary | ICD-10-CM | POA: Diagnosis not present

## 2018-05-24 DIAGNOSIS — Z9889 Other specified postprocedural states: Secondary | ICD-10-CM | POA: Diagnosis not present

## 2018-05-24 DIAGNOSIS — M25512 Pain in left shoulder: Secondary | ICD-10-CM | POA: Diagnosis not present

## 2018-05-24 DIAGNOSIS — M25612 Stiffness of left shoulder, not elsewhere classified: Secondary | ICD-10-CM | POA: Diagnosis not present

## 2018-05-31 DIAGNOSIS — M6281 Muscle weakness (generalized): Secondary | ICD-10-CM | POA: Diagnosis not present

## 2018-05-31 DIAGNOSIS — M25512 Pain in left shoulder: Secondary | ICD-10-CM | POA: Diagnosis not present

## 2018-05-31 DIAGNOSIS — M25612 Stiffness of left shoulder, not elsewhere classified: Secondary | ICD-10-CM | POA: Diagnosis not present

## 2018-05-31 DIAGNOSIS — R29898 Other symptoms and signs involving the musculoskeletal system: Secondary | ICD-10-CM | POA: Diagnosis not present

## 2018-05-31 DIAGNOSIS — Z9889 Other specified postprocedural states: Secondary | ICD-10-CM | POA: Diagnosis not present

## 2018-06-17 DIAGNOSIS — Z9889 Other specified postprocedural states: Secondary | ICD-10-CM | POA: Diagnosis not present

## 2018-06-17 DIAGNOSIS — S46002D Unspecified injury of muscle(s) and tendon(s) of the rotator cuff of left shoulder, subsequent encounter: Secondary | ICD-10-CM | POA: Diagnosis not present

## 2018-07-09 ENCOUNTER — Encounter: Payer: Self-pay | Admitting: Family Medicine

## 2018-07-12 ENCOUNTER — Encounter: Payer: Self-pay | Admitting: Family Medicine

## 2018-07-13 ENCOUNTER — Ambulatory Visit (INDEPENDENT_AMBULATORY_CARE_PROVIDER_SITE_OTHER): Payer: Medicare Other | Admitting: Family Medicine

## 2018-07-13 ENCOUNTER — Other Ambulatory Visit: Payer: Self-pay

## 2018-07-13 ENCOUNTER — Encounter: Payer: Self-pay | Admitting: Family Medicine

## 2018-07-13 DIAGNOSIS — I10 Essential (primary) hypertension: Secondary | ICD-10-CM | POA: Diagnosis not present

## 2018-07-13 DIAGNOSIS — Z9889 Other specified postprocedural states: Secondary | ICD-10-CM

## 2018-07-13 DIAGNOSIS — E785 Hyperlipidemia, unspecified: Secondary | ICD-10-CM

## 2018-07-13 MED ORDER — OLMESARTAN MEDOXOMIL-HCTZ 40-25 MG PO TABS: 1.0000 | ORAL_TABLET | Freq: Every day | ORAL | 5 refills | Status: DC

## 2018-07-13 MED ORDER — ASPIRIN EC 81 MG PO TBEC: 81.0000 mg | DELAYED_RELEASE_TABLET | Freq: Every day | ORAL | 3 refills | Status: DC

## 2018-07-13 MED ORDER — ROSUVASTATIN CALCIUM 10 MG PO TABS: 10.0000 mg | ORAL_TABLET | Freq: Every day | ORAL | 1 refills | Status: DC

## 2018-07-13 NOTE — Progress Notes (Signed)
Name: Lisa Steele   MRN: [id number]    DOB: 1945/11/25   Date:07/13/2018       Progress Note  Subjective  Chief Complaint  Chief Complaint  Patient presents with  . Hypertension  . Hyperglycemia    Fasting blood sugar  . Dyslipidemia    I connected with  [name]  on 07/13/18 at  9:00 AM EDT by a video enabled telemedicine application and verified that I am speaking with the correct person using two identifiers.  I discussed the limitations of evaluation and management by telemedicine and the availability of in person appointments. The patient expressed understanding and agreed to proceed. Staff also discussed with the patient that there may be a patient responsible charge related to this service. Patient Location: at home Provider Location: [facility]   HPI  Morbid obesity: she has lost16lbs prior to last visit, she is trying to eat healthy, she has been active . She is doing very well with life style modification   HTN: taking bp medication daily, she denies chest pain , SOB or palpitation.Denies dizziness. BP is usually within normal limits it was 140 systolic on her last visit, but it was right after PT. Today she checked while on the phone, her cuff is small and had to check it on her forearm. BP was 159/88 , however usually on target at home 120's-130's/70-80's  Hyperlipidemia: taking Crestor, no myalgias, compliant with medications.  Rotator cuff repair: since last visit she was seen by Dr. Erin Sons for left shoulder pain, s/p rotator cuff repair in 11/2017 but pain was still present and had multiple sessions of PT and is doing better, pain is about 75 %, she is still doing home PT , she states only has pain when she is doing PT exercises - abducting but not with daily activity   Elevated glucose: family history of DM, previous hyperglycemia on labs, last hgbA1C was 4.9%, but elevated fasting insulin. She denies polyphagia, polydipsia or  polyuria.   Patient Active Problem List   Diagnosis Date Noted  . Trigger finger of left thumb 03/04/2018  . Left hand pain 02/03/2018  . S/P rotator cuff repair 01/12/2018  . Injury of left rotator cuff 11/03/2017  . Morbid obesity due to excess calories (HCC) 02/19/2017  . Primary osteoarthritis of both knees 06/22/2014  . Obesity (BMI 30-39.9) 06/22/2014  . Dysmetabolic syndrome 06/22/2014  . Post-menopausal 06/22/2014  . H/O cold sores 06/22/2014  . Dyslipidemia 06/22/2014  . Elevated fasting blood sugar 06/22/2014  . Benign essential HTN 06/22/2014  . Abnormal ECG 06/22/2014    Past Surgical History:  Procedure Laterality Date  . APPENDECTOMY    . BREAST EXCISIONAL BIOPSY Left 1970   neg  . BREAST SURGERY Left    cyst removed  . BUNIONECTOMY    . CATARACT EXTRACTION, BILATERAL  10/2013  . COLONOSCOPY WITH PROPOFOL N/A 06/25/2016   Procedure: COLONOSCOPY WITH PROPOFOL;  Surgeon: Scot Jun, MD;  Location: [facility] ENDOSCOPY;  Service: Endoscopy;  Laterality: N/A;  . ECTOPIC PREGNANCY SURGERY    . EYE SURGERY    . SHOULDER ARTHROSCOPY WITH ROTATOR CUFF REPAIR AND SUBACROMIAL DECOMPRESSION Left 11/25/2017   Procedure: SHOULDER ARTHROSCOPic release of long head biceps tendon and mini open rotator cuff repair;  Surgeon: Erin Sons, MD;  Location: [facility] ORS;  Service: Orthopedics;  Laterality: Left;  . TONSILECTOMY, ADENOIDECTOMY, BILATERAL MYRINGOTOMY AND TUBES    . TONSILLECTOMY      Family History  Problem Relation Age of Onset  . Diabetes Mother   . Hypertension Mother   . Heart disease Mother   . Hyperlipidemia Mother   . Heart disease Father   . Diabetes Sister   . Heart disease Sister   . Hypertension Sister   . Hyperlipidemia Sister   . Breast cancer Sister 41  . Cancer Sister 64       breast  . Breast cancer Maternal Aunt   . Diabetes Sister   . Diabetes Sister   . Diabetes Sister     Social History   Socioeconomic History  . Marital status:  Married    Spouse name: [name]   . Number of children: 1  . Years of education: Not on file  . Highest education level: Associate degree: academic program  Occupational History  . Occupation: Theatre stage manager     Comment: [employer]   Social Needs  . Financial resource strain: Not hard at all  . Food insecurity:    Worry: Never true    Inability: Never true  . Transportation needs:    Medical: No    Non-medical: No  Tobacco Use  . Smoking status: Former Smoker    Years: 10.00    Types: Cigarettes    Start date: 03/03/1958  . Smokeless tobacco: Never Used  Substance and Sexual Activity  . Alcohol use: Yes    Alcohol/week: 0.0 standard drinks    Comment: socially  . Drug use: No  . Sexual activity: Not Currently    Comment: husband has ED  Lifestyle  . Physical activity:    Days per week: 0 days    Minutes per session: 0 min  . Stress: Only a little  Relationships  . Social connections:    Talks on phone: More than three times a week    Gets together: More than three times a week    Attends religious service: More than 4 times per year    Active member of club or organization: Yes    Attends meetings of clubs or organizations: More than 4 times per year    Relationship status: Married  . Intimate partner violence:    Fear of current or ex partner: No    Emotionally abused: No    Physically abused: No    Forced sexual activity: No  Other Topics Concern  . Not on file  Social History Narrative   Married, son lives in [place] and has 3 grandchildren , youngest born 10/2016     Current Outpatient Medications:  .  aspirin EC 81 MG tablet, Take 81 mg by mouth daily., Disp: , Rfl:  .  olmesartan-hydrochlorothiazide (BENICAR HCT) 40-25 MG tablet, Take 1 tablet by mouth daily., Disp: 30 tablet, Rfl: 2 .  rosuvastatin (CRESTOR) 10 MG tablet, TAKE 1 TABLET BY MOUTH  DAILY, Disp: 90 tablet, Rfl: 1  Allergies  Allergen Reactions  . Tramadol Nausea And  Vomiting  . Doxycycline Itching  . Penicillins Hives and Rash    leg lesions and weakness (couldn't walk)    I personally reviewed active problem list, medication list, allergies, family history, social history with the patient/caregiver today.   ROS  Ten systems reviewed and is negative except as mentioned in HPI   Objective  Virtual encounter, vitals not obtained.  There is no height or weight on file to calculate BMI.  Physical Exam  Awake, alert and oriented   PHQ2/9: Depression screen [facility] 2/9 07/13/2018 04/12/2018 03/09/2018 02/19/2017 02/14/2016  Decreased Interest 0 0 0 0 0  Down, Depressed, Hopeless 0 0 0 0 0  PHQ - 2 Score 0 0 0 0 0  Altered sleeping 0 - - - -  Tired, decreased energy 0 - - - -  Change in appetite 0 - - - -  Feeling bad or failure about yourself  0 - - - -  Trouble concentrating 0 - - - -  Moving slowly or fidgety/restless 0 - - - -  Suicidal thoughts 0 - - - -  PHQ-9 Score 0 - - - -   PHQ-2/9 Result is negative.    Fall Risk: Fall Risk  07/13/2018 04/12/2018 03/09/2018 02/19/2017 02/14/2016  Falls in the past year? 0 0 1 No No  Number falls in past yr: 0 0 1 - -  Comment - - slipped on pool deck - -  Injury with Fall? 0 0 1 - -  Comment - - left shoulder rotator cuff injury - -  Follow up - - Falls evaluation completed;Falls prevention discussed - -     Assessment & Plan   1. Benign essential HTN  - olmesartan-hydrochlorothiazide (BENICAR HCT) 40-25 MG tablet; Take 1 tablet by mouth daily.  Dispense: 30 tablet; Refill: 2  2. Dyslipidemia  - rosuvastatin (CRESTOR) 10 MG tablet; Take 1 tablet (10 mg total) by mouth daily.  Dispense: 90 tablet; Refill: 1   3. History of repair of left rotator cuff   4. Morbid obesity due to excess calories (HCC)  Continue life style modification  I discussed the assessment and treatment plan with the patient. The patient was provided an opportunity to ask questions and all were answered. The patient  agreed with the plan and demonstrated an understanding of the instructions.  The patient was advised to call back or seek an in-person evaluation if the symptoms worsen or if the condition fails to improve as anticipated.  I provided 25  minutes of non-face-to-face time during this encounter.

## 2018-08-19 DIAGNOSIS — Z9889 Other specified postprocedural states: Secondary | ICD-10-CM | POA: Diagnosis not present

## 2018-08-19 DIAGNOSIS — S46002D Unspecified injury of muscle(s) and tendon(s) of the rotator cuff of left shoulder, subsequent encounter: Secondary | ICD-10-CM | POA: Diagnosis not present

## 2018-09-10 ENCOUNTER — Ambulatory Visit
Admission: RE | Admit: 2018-09-10 | Discharge: 2018-09-10 | Disposition: A | Discharge status: Home / Self Care | Payer: Medicare Other | Source: Ambulatory Visit | Attending: Family Medicine | Admitting: Family Medicine

## 2018-09-10 ENCOUNTER — Other Ambulatory Visit: Payer: Self-pay

## 2018-09-10 DIAGNOSIS — Z1231 Encounter for screening mammogram for malignant neoplasm of breast: Secondary | ICD-10-CM | POA: Insufficient documentation

## 2018-10-01 DIAGNOSIS — H35372 Puckering of macula, left eye: Secondary | ICD-10-CM | POA: Diagnosis not present

## 2018-10-01 DIAGNOSIS — H26491 Other secondary cataract, right eye: Secondary | ICD-10-CM | POA: Diagnosis not present

## 2018-11-15 ENCOUNTER — Encounter: Payer: Self-pay | Admitting: Family Medicine

## 2018-11-15 ENCOUNTER — Other Ambulatory Visit: Payer: Self-pay

## 2018-11-15 ENCOUNTER — Ambulatory Visit (INDEPENDENT_AMBULATORY_CARE_PROVIDER_SITE_OTHER): Payer: Medicare Other | Admitting: Family Medicine

## 2018-11-15 VITALS — BP 136/72 | HR 62 | Temp 96.9°F | Resp 16 | Ht 63.0 in | Wt 190.6 lb

## 2018-11-15 DIAGNOSIS — R7301 Impaired fasting glucose: Secondary | ICD-10-CM

## 2018-11-15 DIAGNOSIS — E785 Hyperlipidemia, unspecified: Secondary | ICD-10-CM

## 2018-11-15 DIAGNOSIS — Z9889 Other specified postprocedural states: Secondary | ICD-10-CM | POA: Diagnosis not present

## 2018-11-15 DIAGNOSIS — M170 Bilateral primary osteoarthritis of knee: Secondary | ICD-10-CM | POA: Diagnosis not present

## 2018-11-15 DIAGNOSIS — E669 Obesity, unspecified: Secondary | ICD-10-CM

## 2018-11-15 DIAGNOSIS — I10 Essential (primary) hypertension: Secondary | ICD-10-CM

## 2018-11-15 DIAGNOSIS — Z23 Encounter for immunization: Secondary | ICD-10-CM | POA: Diagnosis not present

## 2018-11-15 MED ORDER — OLMESARTAN MEDOXOMIL-HCTZ 40-25 MG PO TABS: 1.0000 | ORAL_TABLET | Freq: Every day | ORAL | 1 refills | Status: DC

## 2018-11-15 MED ORDER — ASPIRIN EC 81 MG PO TBEC: 81.0000 mg | DELAYED_RELEASE_TABLET | Freq: Every day | ORAL | 3 refills | Status: DC

## 2018-11-15 MED ORDER — ROSUVASTATIN CALCIUM 10 MG PO TABS: 10.0000 mg | ORAL_TABLET | Freq: Every day | ORAL | 1 refills | Status: DC

## 2018-11-15 NOTE — Progress Notes (Signed)
Name: Lisa Steele   MRN: [id number]    DOB: Jul 13, 1945   Date:11/15/2018       Progress Note  Subjective  Chief Complaint  Chief Complaint  Patient presents with  . Medication Refill    4 month F/U  . Hypertension  . Dyslipidemia  . Hyperglycemia  . Morbid Obesity    HPI  Morbid obesity: she has lost6 lbs since last visit, she states she is trying to eat healthy , lot of fruit and vegetables, she has a stationary bike at home and tries to use it for 45 minutes five days a week.   HTN: taking bp medication daily, she denies chest pain , SOB or palpitation.Denies dizziness. BP is at goal today, no side effects of medications   Hyperlipidemia: taking Crestor, no myalgias, compliant with medications.Last LDL 82 showed improvement   Rotator cuff repair: since last visit she was seen by Dr. Erin Sons for left shoulder pain, s/p rotator cuff repair in 11/2017 but pain was still present and had multiple sessions of PT and is doing better, she stopped PT because of COVID-19 but is doing home exercises she states only has pain with elevation of arm above 90 degrees, no longer has constant pain   Elevated glucose: family history of DM, previous hyperglycemia on labs, last hgbA1C was 4.9%, but elevated fasting insulin and fasting glucose also slightly.   OA: both knees, he is doing home bike exercises. She has seen Dr. Rosita Kea in the past   Patient Active Problem List   Diagnosis Date Noted  . Trigger finger of left thumb 03/04/2018  . Left hand pain 02/03/2018  . S/P rotator cuff repair 01/12/2018  . Injury of left rotator cuff 11/03/2017  . Morbid obesity due to excess calories (HCC) 02/19/2017  . Primary osteoarthritis of both knees 06/22/2014  . Obesity (BMI 30-39.9) 06/22/2014  . Dysmetabolic syndrome 06/22/2014  . Post-menopausal 06/22/2014  . H/O cold sores 06/22/2014  . Dyslipidemia 06/22/2014  . Elevated fasting blood sugar 06/22/2014  . Benign essential HTN  06/22/2014  . Abnormal ECG 06/22/2014    Past Surgical History:  Procedure Laterality Date  . APPENDECTOMY    . BREAST EXCISIONAL BIOPSY Left 1970   neg  . BREAST SURGERY Left    cyst removed  . BUNIONECTOMY    . CATARACT EXTRACTION, BILATERAL  10/2013  . COLONOSCOPY WITH PROPOFOL N/A 06/25/2016   Procedure: COLONOSCOPY WITH PROPOFOL;  Surgeon: Scot Jun, MD;  Location: [facility] ENDOSCOPY;  Service: Endoscopy;  Laterality: N/A;  . ECTOPIC PREGNANCY SURGERY    . EYE SURGERY    . SHOULDER ARTHROSCOPY WITH ROTATOR CUFF REPAIR AND SUBACROMIAL DECOMPRESSION Left 11/25/2017   Procedure: SHOULDER ARTHROSCOPic release of long head biceps tendon and mini open rotator cuff repair;  Surgeon: Erin Sons, MD;  Location: [facility] ORS;  Service: Orthopedics;  Laterality: Left;  . TONSILECTOMY, ADENOIDECTOMY, BILATERAL MYRINGOTOMY AND TUBES    . TONSILLECTOMY      Family History  Problem Relation Age of Onset  . Diabetes Mother   . Hypertension Mother   . Heart disease Mother   . Hyperlipidemia Mother   . Heart disease Father   . Diabetes Sister   . Heart disease Sister   . Hypertension Sister   . Hyperlipidemia Sister   . Breast cancer Sister 14  . Cancer Sister 82       breast  . Breast cancer Maternal Aunt   . Diabetes Sister   . Diabetes  Sister   . Diabetes Sister     Social History   Socioeconomic History  . Marital status: Married    Spouse name: Elonda Husky   . Number of children: 1  . Years of education: Not on file  . Highest education level: Associate degree: academic program  Occupational History  . Occupation: Counsellor     Comment: [employer]   Social Needs  . Financial resource strain: Not hard at all  . Food insecurity    Worry: Never true    Inability: Never true  . Transportation needs    Medical: No    Non-medical: No  Tobacco Use  . Smoking status: Former Smoker    Years: 10.00    Types: Cigarettes    Start date: 03/03/1958  .  Smokeless tobacco: Never Used  Substance and Sexual Activity  . Alcohol use: Yes    Alcohol/week: 0.0 standard drinks    Comment: socially  . Drug use: No  . Sexual activity: Not Currently    Comment: husband has ED  Lifestyle  . Physical activity    Days per week: 0 days    Minutes per session: 0 min  . Stress: Only a little  Relationships  . Social connections    Talks on phone: More than three times a week    Gets together: More than three times a week    Attends religious service: More than 4 times per year    Active member of club or organization: Yes    Attends meetings of clubs or organizations: More than 4 times per year    Relationship status: Married  . Intimate partner violence    Fear of current or ex partner: No    Emotionally abused: No    Physically abused: No    Forced sexual activity: No  Other Topics Concern  . Not on file  Social History Narrative   Married, son lives in [place] and has 3 grandchildren , youngest born 10/2016     Current Outpatient Medications:  .  aspirin EC 81 MG tablet, Take 1 tablet (81 mg total) by mouth daily., Disp: 90 tablet, Rfl: 3 .  meloxicam (MOBIC) 15 MG tablet, Take 1 tablet by mouth daily., Disp: , Rfl:  .  olmesartan-hydrochlorothiazide (BENICAR HCT) 40-25 MG tablet, Take 1 tablet by mouth daily., Disp: 90 tablet, Rfl: 1 .  rosuvastatin (CRESTOR) 10 MG tablet, Take 1 tablet (10 mg total) by mouth daily., Disp: 90 tablet, Rfl: 1  Allergies  Allergen Reactions  . Tramadol Nausea And Vomiting  . Doxycycline Itching  . Penicillins Hives and Rash    leg lesions and weakness (couldn't walk)    I personally reviewed active problem list, medication list, allergies, family history, social history, health maintenance with the patient/caregiver today.   ROS  Constitutional: Negative for fever or weight change.  Respiratory: Negative for cough and shortness of breath.   Cardiovascular: Negative for chest pain or  palpitations.  Gastrointestinal: Negative for abdominal pain, no bowel changes.  Musculoskeletal: Negative for gait problem or joint swelling.  Skin: Negative for rash.  Neurological: Negative for dizziness or headache.  No other specific complaints in a complete review of systems (except as listed in HPI above).  Objective  Vitals:   11/15/18 0932  BP: 136/72  Pulse: 62  Resp: 16  Temp: (!) 96.9 F (36.1 C)  TempSrc: Temporal  SpO2: 100%  Weight: 190 lb 9.6 oz (86.5 kg)  Height: 5'  3" (1.6 m)    Body mass index is 33.76 kg/m.  Physical Exam  Constitutional: Patient appears well-developed and well-nourished. Obese  No distress.  HEENT: head atraumatic, normocephalic, pupils equal and reactive to light Cardiovascular: Normal rate, regular rhythm and normal heart sounds.  No murmur heard. No BLE edema. Pulmonary/Chest: Effort normal and breath sounds normal. No respiratory distress. Abdominal: Soft.  There is no tenderness. Muscular Skeletal: decrease rom of left shoulder  Psychiatric: Patient has a normal mood and affect. behavior is normal. Judgment and thought content normal.  PHQ2/9: Depression screen [facility] 2/9 11/15/2018 07/13/2018 04/12/2018 03/09/2018 02/19/2017  Decreased Interest 0 0 0 0 0  Down, Depressed, Hopeless 0 0 0 0 0  PHQ - 2 Score 0 0 0 0 0  Altered sleeping 0 0 - - -  Tired, decreased energy 0 0 - - -  Change in appetite 0 0 - - -  Feeling bad or failure about yourself  0 0 - - -  Trouble concentrating 0 0 - - -  Moving slowly or fidgety/restless 0 0 - - -  Suicidal thoughts 0 0 - - -  PHQ-9 Score 0 0 - - -    phq 9 is negative   Fall Risk: Fall Risk  11/15/2018 07/13/2018 04/12/2018 03/09/2018 02/19/2017  Falls in the past year? 0 0 0 1 No  Number falls in past yr: 0 0 0 1 -  Comment - - - slipped on pool deck -  Injury with Fall? 0 0 0 1 -  Comment - - - left shoulder rotator cuff injury -  Follow up - - - Falls evaluation completed;Falls prevention  discussed -    Assessment & Plan  1. Benign essential HTN  - olmesartan-hydrochlorothiazide (BENICAR HCT) 40-25 MG tablet; Take 1 tablet by mouth daily.  Dispense: 90 tablet; Refill: 1  2. Dyslipidemia  - rosuvastatin (CRESTOR) 10 MG tablet; Take 1 tablet (10 mg total) by mouth daily.  Dispense: 90 tablet; Refill: 1 - aspirin EC 81 MG tablet; Take 1 tablet (81 mg total) by mouth daily.  Dispense: 90 tablet; Refill: 3  3. Obesity   Discussed with the patient the risk posed by an increased BMI. Discussed importance of portion control, calorie counting and at least 150 minutes of physical activity weekly. Avoid sweet beverages and drink more water. Eat at least 6 servings of fruit and vegetables daily   4. Elevated fasting blood sugar   5. Need for immunization against influenza  - Flu Vaccine QUAD High Dose(Fluad)  6. Primary osteoarthritis of both knees  Doing well at this time  7. History of repair of left rotator cuff  Doing home exercises

## 2018-12-29 DIAGNOSIS — S93312A Subluxation of tarsal joint of left foot, initial encounter: Secondary | ICD-10-CM | POA: Diagnosis not present

## 2018-12-29 DIAGNOSIS — M79672 Pain in left foot: Secondary | ICD-10-CM | POA: Diagnosis not present

## 2019-01-06 DIAGNOSIS — Z20828 Contact with and (suspected) exposure to other viral communicable diseases: Secondary | ICD-10-CM | POA: Diagnosis not present

## 2019-01-12 DIAGNOSIS — S93312A Subluxation of tarsal joint of left foot, initial encounter: Secondary | ICD-10-CM | POA: Diagnosis not present

## 2019-01-12 DIAGNOSIS — M79672 Pain in left foot: Secondary | ICD-10-CM | POA: Diagnosis not present

## 2019-03-10 DIAGNOSIS — M1711 Unilateral primary osteoarthritis, right knee: Secondary | ICD-10-CM | POA: Diagnosis not present

## 2019-03-15 ENCOUNTER — Ambulatory Visit (INDEPENDENT_AMBULATORY_CARE_PROVIDER_SITE_OTHER): Payer: Medicare Other

## 2019-03-15 VITALS — BP 124/73 | HR 80 | Temp 96.9°F | Ht 63.0 in | Wt 193.5 lb

## 2019-03-15 DIAGNOSIS — Z0000 Encounter for general adult medical examination without abnormal findings: Secondary | ICD-10-CM

## 2019-03-15 NOTE — Progress Notes (Signed)
Subjective:   Lisa Steele is a 74 y.o. female who presents for Medicare Annual (Subsequent) preventive examination.  Virtual Visit via Telephone Note  I connected with Lisa Steele on 03/15/19 at  9:20 AM EST by telephone and verified that I am speaking with the correct person using two identifiers.  Medicare Annual Wellness visit completed telephonically due to Covid-19 pandemic.   Location: Patient: home Provider: office   I discussed the limitations, risks, security and privacy concerns of performing an evaluation and management service by telephone and the availability of in person appointments. The patient expressed understanding and agreed to proceed.  Some vital signs may be absent or patient reported.   Clemetine Marker, LPN   Review of Systems:   Cardiac Risk Factors include: advanced age (>71men, >93 women);dyslipidemia;hypertension;obesity (BMI >30kg/m2)     Objective:     Vitals: BP 124/73   Pulse 80   Temp (!) 96.9 F (36.1 C)   Ht 5\' 3"  (1.6 m)   Wt 193 lb 8 oz (87.8 kg)   BMI 34.28 kg/m   Body mass index is 34.28 kg/m.  Advanced Directives 03/15/2019 03/09/2018 11/23/2017 08/15/2016 06/25/2016 02/14/2016 08/08/2015  Does Patient Have a Medical Advance Directive? Yes Yes Yes Yes Yes Yes Yes  Type of Paramedic of [place];Living will [place];Living will [place];Living will [place];Living will Living will [place];Living will [place];Living will  Does patient want to make changes to medical advance directive? Yes (MAU/Ambulatory/Procedural Areas - Information given) - No - Patient declined - - - No - Patient declined  Copy of [place] in Chart? No - copy requested No - copy requested No - copy requested - - No - copy requested -    Tobacco Social History   Tobacco Use  Smoking Status Former Smoker  . Years: 10.00   . Types: Cigarettes  . Start date: 03/03/1958  Smokeless Tobacco Never Used     Counseling given: Not Answered   Clinical Intake:  Pre-visit preparation completed: Yes  Pain : 0-10 Pain Score: 8  Pain Type: Chronic pain Pain Location: Knee Pain Orientation: Right Pain Descriptors / Indicators: Aching, Discomfort, Sore Pain Onset: More than a month ago Pain Frequency: Constant     BMI - recorded: 34.28 Nutritional Status: BMI > 30  Obese Nutritional Risks: None Diabetes: No  How often do you need to have someone help you when you read instructions, pamphlets, or other written materials from your doctor or pharmacy?: 1 - Never  Interpreter Needed?: No  Information entered by :: Clemetine Marker LPN  Past Medical History:  Diagnosis Date  . Cataracts, bilateral   . Hyperlipidemia   . Hypertension   . Joint pain    both knees   Past Surgical History:  Procedure Laterality Date  . APPENDECTOMY    . BREAST EXCISIONAL BIOPSY Left 1970   neg  . BREAST SURGERY Left    cyst removed  . BUNIONECTOMY    . CATARACT EXTRACTION, BILATERAL  10/2013  . COLONOSCOPY WITH PROPOFOL N/A 06/25/2016   Procedure: COLONOSCOPY WITH PROPOFOL;  Surgeon: Manya Silvas, MD;  Location: [facility];  Service: Endoscopy;  Laterality: N/A;  . ECTOPIC PREGNANCY SURGERY    . EYE SURGERY    . SHOULDER ARTHROSCOPY WITH ROTATOR CUFF REPAIR AND SUBACROMIAL DECOMPRESSION Left 11/25/2017   Procedure: SHOULDER ARTHROSCOPic release of long head biceps tendon and mini  open rotator cuff repair;  Surgeon: Erin Sons, MD;  Location: [facility] ORS;  Service: Orthopedics;  Laterality: Left;  . TONSILECTOMY, ADENOIDECTOMY, BILATERAL MYRINGOTOMY AND TUBES    . TONSILLECTOMY     Family History  Problem Relation Age of Onset  . Diabetes Mother   . Hypertension Mother   . Heart disease Mother   . Hyperlipidemia Mother   . Heart disease Father   . Diabetes Sister   . Heart disease Sister   . Hypertension  Sister   . Hyperlipidemia Sister   . Breast cancer Sister 58  . Cancer Sister 52       breast  . Breast cancer Maternal Aunt   . Diabetes Sister   . Stroke Sister   . Diabetes Sister   . Diabetes Sister    Social History   Socioeconomic History  . Marital status: Married    Spouse name: [name]   . Number of children: 1  . Years of education: Not on file  . Highest education level: Associate degree: academic program  Occupational History  . Occupation: Theatre stage manager     Comment: [employer]   Tobacco Use  . Smoking status: Former Smoker    Years: 10.00    Types: Cigarettes    Start date: 03/03/1958  . Smokeless tobacco: Never Used  Substance and Sexual Activity  . Alcohol use: Yes    Alcohol/week: 0.0 standard drinks    Comment: socially  . Drug use: No  . Sexual activity: Not Currently    Comment: husband has ED  Other Topics Concern  . Not on file  Social History Narrative   Married, son lives in [place] and has 3 grandchildren , youngest born 10/2016   Social Determinants of Health   Financial Resource Strain: Low Risk   . Difficulty of Paying Living Expenses: Not hard at all  Food Insecurity: No Food Insecurity  . Worried About Programme researcher, broadcasting/film/video in the Last Year: Never true  . Ran Out of Food in the Last Year: Never true  Transportation Needs: No Transportation Needs  . Lack of Transportation (Medical): No  . Lack of Transportation (Non-Medical): No  Physical Activity: Inactive  . Days of Exercise per Week: 0 days  . Minutes of Exercise per Session: 0 min  Stress: No Stress Concern Present  . Feeling of Stress : Not at all  Social Connections: Not Isolated  . Frequency of Communication with Friends and Family: More than three times a week  . Frequency of Social Gatherings with Friends and Family: More than three times a week  . Attends Religious Services: More than 4 times per year  . Active Member of Clubs or Organizations: Yes  .  Attends Banker Meetings: More than 4 times per year  . Marital Status: Married    Outpatient Encounter Medications as of 03/15/2019  Medication Sig  . aspirin EC 81 MG tablet Take 1 tablet (81 mg total) by mouth daily.  Marland Kitchen olmesartan-hydrochlorothiazide (BENICAR HCT) 40-25 MG tablet Take 1 tablet by mouth daily.  . rosuvastatin (CRESTOR) 10 MG tablet Take 1 tablet (10 mg total) by mouth daily.  . meloxicam (MOBIC) 15 MG tablet Take 1 tablet by mouth daily.   No facility-administered encounter medications on file as of 03/15/2019.    Activities of Daily Living In your present state of health, do you have any difficulty performing the following activities: 03/15/2019 07/13/2018  Hearing? N N  Comment declines hearing  aids -  Vision? N N  Difficulty concentrating or making decisions? N N  Walking or climbing stairs? N N  Dressing or bathing? N N  Doing errands, shopping? N N  Preparing Food and eating ? N -  Using the Toilet? N -  In the past six months, have you accidently leaked urine? N -  Do you have problems with loss of bowel control? N -  Managing your Medications? N -  Managing your Finances? N -  Housekeeping or managing your Housekeeping? N -  Some recent data might be hidden    Patient Care Team: [name], MD as PCP - General (Family Medicine) Alwyn Pea, MD as Consulting Physician (Cardiology) Scot Jun, MD (Inactive) as Consulting Physician (Gastroenterology) Kennedy Bucker, MD as Consulting Physician (Orthopedic Surgery)    Assessment:   This is a routine wellness examination for Jerald.  Exercise Activities and Dietary recommendations Current Exercise Habits: The patient does not participate in regular exercise at present, Exercise limited by: orthopedic condition(s)  Goals    . Weight (lb) < 200 lb (90.7 kg)     Pt would like to lose at least 10 lbs this year       Fall Risk Fall Risk  03/15/2019 11/15/2018 07/13/2018  04/12/2018 03/09/2018  Falls in the past year? 0 0 0 0 1  Number falls in past yr: 0 0 0 0 1  Comment - - - - slipped on pool deck  Injury with Fall? 0 0 0 0 1  Comment - - - - left shoulder rotator cuff injury  Risk for fall due to : Orthopedic patient - - - -  Follow up Falls prevention discussed - - - Falls evaluation completed;Falls prevention discussed   FALL RISK PREVENTION PERTAINING TO THE HOME:  Any stairs in or around the home? Yes  If so, do they handrails? Yes   Home free of loose throw rugs in walkways, pet beds, electrical cords, etc? Yes  Adequate lighting in your home to reduce risk of falls? Yes   ASSISTIVE DEVICES UTILIZED TO PREVENT FALLS:  Life alert? No  Use of a cane, walker or w/c? No  Grab bars in the bathroom? No  Shower chair or bench in shower? Yes  Elevated toilet seat or a handicapped toilet? Yes   DME ORDERS:  DME order needed?  No   TIMED UP AND GO:  Was the test performed? No . Telephonic visit.   Education: Fall risk prevention has been discussed.  Intervention(s) required? No   Depression Screen PHQ 2/9 Scores 03/15/2019 11/15/2018 07/13/2018 04/12/2018  PHQ - 2 Score 0 0 0 0  PHQ- 9 Score - 0 0 -     Cognitive Function - 6CIT deferred for 2020 AWV, pt has no memory issues.       6CIT Screen 03/09/2018  What Year? 0 points  What month? 0 points  What time? 0 points  Count back from 20 0 points  Months in reverse 0 points  Repeat phrase 0 points  Total Score 0    Immunization History  Administered Date(s) Administered  . Fluad Quad(high Dose 65+) 11/15/2018  . Influenza, High Dose Seasonal PF 02/14/2016, 11/12/2016, 01/12/2018  . Influenza,inj,Quad PF,6+ Mos 02/10/2014, 01/29/2015  . Influenza-Unspecified 02/10/2014, 01/29/2015, 02/14/2016, 11/12/2016  . Pneumococcal Conjugate-13 08/14/2014  . Pneumococcal Polysaccharide-23 01/27/2012  . Tdap 12/27/2010, 08/19/2017  . Zoster 01/01/2011  . Zoster Recombinat (Shingrix) 11/22/2018     Qualifies for Shingles  Vaccine? Yes  Zostavax completed 2012. Due for second dose of Shingrix.   Tdap: Up to date  Flu Vaccine: Up to date  Pneumococcal Vaccine: Up to date   Screening Tests Health Maintenance  Topic Date Due  . MAMMOGRAM  09/09/2020  . COLONOSCOPY  06/25/2021  . TETANUS/TDAP  08/20/2027  . INFLUENZA VACCINE  Completed  . DEXA SCAN  Completed  . Hepatitis C Screening  Completed  . PNA vac Low Risk Adult  Completed    Cancer Screenings:  Colorectal Screening: Completed 06/15/16. Repeat every 5 years;   Mammogram: Completed 09/10/18. Repeat every year.  Bone Density: Completed 09/28/17. Results reflect NORMAL.  Repeat every 2 years.   Lung Cancer Screening: (Low Dose CT Chest recommended if Age 9-80 years, 30 pack-year currently smoking OR have quit w/in 15years.) does not qualify.    Additional Screening:  Hepatitis C Screening: does qualify; Completed 01/27/12  Vision Screening: Recommended annual ophthalmology exams for early detection of glaucoma and other disorders of the eye. Is the patient up to date with their annual eye exam?  Yes  Who is the provider or what is the name of the office in which the pt attends annual eye exams? [facility]  Dental Screening: Recommended annual dental exams for proper oral hygiene  Community Resource Referral:  CRR required this visit?  No      Plan:     I have personally reviewed and addressed the Medicare Annual Wellness questionnaire and have noted the following in the patient's chart:  A. Medical and social history B. Use of alcohol, tobacco or illicit drugs  C. Current medications and supplements D. Functional ability and status E.  Nutritional status F.  Physical activity G. Advance directives H. List of other physicians I.  Hospitalizations, surgeries, and ER visits in previous 12 months J.  Vitals K. Screenings such as hearing and vision if needed, cognitive and depression L.  Referrals and appointments   In addition, I have reviewed and discussed with patient certain preventive protocols, quality metrics, and best practice recommendations. A written personalized care plan for preventive services as well as general preventive health recommendations were provided to patient.   Signed,  Reather Littler, LPN Nurse Health Advisor   Nurse Notes: none

## 2019-03-15 NOTE — Patient Instructions (Signed)
Lisa Steele , Thank you for taking time to come for your Medicare Wellness Visit. I appreciate your ongoing commitment to your health goals. Please review the following plan we discussed and let me know if I can assist you in the future.   Screening recommendations/referrals: Colonoscopy: done 06/25/16. Repeat in 2023. Mammogram: done 09/10/18 Bone Density: done 09/28/17 Recommended yearly ophthalmology/optometry visit for glaucoma screening and checkup Recommended yearly dental visit for hygiene and checkup  Vaccinations: Influenza vaccine: done 11/15/18 Pneumococcal vaccine: done 08/14/14 Tdap vaccine: done 08/19/17 Shingles vaccine: due for second dose    Advanced directives: Advance directive discussed with you today. I have provided a copy for you to complete at home and have notarized. Once this is complete please bring a copy in to our office so we can scan it into your chart.  Conditions/risks identified: Recommend increasing physical activity as tolerated  Next appointment: Please follow up in one year for your Medicare Annual Wellness visit.     Preventive Care 21 Years and Older, Female Preventive care refers to lifestyle choices and visits with your health care provider that can promote health and wellness. What does preventive care include?  A yearly physical exam. This is also called an annual well check.  Dental exams once or twice a year.  Routine eye exams. Ask your health care provider how often you should have your eyes checked.  Personal lifestyle choices, including:  Daily care of your teeth and gums.  Regular physical activity.  Eating a healthy diet.  Avoiding tobacco and drug use.  Limiting alcohol use.  Practicing safe sex.  Taking low-dose aspirin every day.  Taking vitamin and mineral supplements as recommended by your health care provider. What happens during an annual well check? The services and screenings done by your health care provider  during your annual well check will depend on your age, overall health, lifestyle risk factors, and family history of disease. Counseling  Your health care provider may ask you questions about your:  Alcohol use.  Tobacco use.  Drug use.  Emotional well-being.  Home and relationship well-being.  Sexual activity.  Eating habits.  History of falls.  Memory and ability to understand (cognition).  Work and work Astronomer.  Reproductive health. Screening  You may have the following tests or measurements:  Height, weight, and BMI.  Blood pressure.  Lipid and cholesterol levels. These may be checked every 5 years, or more frequently if you are over 7 years old.  Skin check.  Lung cancer screening. You may have this screening every year starting at age 80 if you have a 30-pack-year history of smoking and currently smoke or have quit within the past 15 years.  Fecal occult blood test (FOBT) of the stool. You may have this test every year starting at age 74.  Flexible sigmoidoscopy or colonoscopy. You may have a sigmoidoscopy every 5 years or a colonoscopy every 10 years starting at age 40.  Hepatitis C blood test.  Hepatitis B blood test.  Sexually transmitted disease (STD) testing.  Diabetes screening. This is done by checking your blood sugar (glucose) after you have not eaten for a while (fasting). You may have this done every 1-3 years.  Bone density scan. This is done to screen for osteoporosis. You may have this done starting at age 30.  Mammogram. This may be done every 1-2 years. Talk to your health care provider about how often you should have regular mammograms. Talk with your health care provider  about your test results, treatment options, and if necessary, the need for more tests. Vaccines  Your health care provider may recommend certain vaccines, such as:  Influenza vaccine. This is recommended every year.  Tetanus, diphtheria, and acellular pertussis  (Tdap, Td) vaccine. You may need a Td booster every 10 years.  Zoster vaccine. You may need this after age 56.  Pneumococcal 13-valent conjugate (PCV13) vaccine. One dose is recommended after age 40.  Pneumococcal polysaccharide (PPSV23) vaccine. One dose is recommended after age 103. Talk to your health care provider about which screenings and vaccines you need and how often you need them. This information is not intended to replace advice given to you by your health care provider. Make sure you discuss any questions you have with your health care provider. Document Released: 03/16/2015 Document Revised: 11/07/2015 Document Reviewed: 12/19/2014 Elsevier Interactive Patient Education  2017 [place] Prevention in the Home Falls can cause injuries. They can happen to people of all ages. There are many things you can do to make your home safe and to help prevent falls. What can I do on the outside of my home?  Regularly fix the edges of walkways and driveways and fix any cracks.  Remove anything that might make you trip as you walk through a door, such as a raised step or threshold.  Trim any bushes or trees on the path to your home.  Use bright outdoor lighting.  Clear any walking paths of anything that might make someone trip, such as rocks or tools.  Regularly check to see if handrails are loose or broken. Make sure that both sides of any steps have handrails.  Any raised decks and porches should have guardrails on the edges.  Have any leaves, snow, or ice cleared regularly.  Use sand or salt on walking paths during winter.  Clean up any spills in your garage right away. This includes oil or grease spills. What can I do in the bathroom?  Use night lights.  Install grab bars by the toilet and in the tub and shower. Do not use towel bars as grab bars.  Use non-skid mats or decals in the tub or shower.  If you need to sit down in the shower, use a plastic, non-slip  stool.  Keep the floor dry. Clean up any water that spills on the floor as soon as it happens.  Remove soap buildup in the tub or shower regularly.  Attach bath mats securely with double-sided non-slip rug tape.  Do not have throw rugs and other things on the floor that can make you trip. What can I do in the bedroom?  Use night lights.  Make sure that you have a light by your bed that is easy to reach.  Do not use any sheets or blankets that are too big for your bed. They should not hang down onto the floor.  Have a firm chair that has side arms. You can use this for support while you get dressed.  Do not have throw rugs and other things on the floor that can make you trip. What can I do in the kitchen?  Clean up any spills right away.  Avoid walking on wet floors.  Keep items that you use a lot in easy-to-reach places.  If you need to reach something above you, use a strong step stool that has a grab bar.  Keep electrical cords out of the way.  Do not use floor polish or  wax that makes floors slippery. If you must use wax, use non-skid floor wax.  Do not have throw rugs and other things on the floor that can make you trip. What can I do with my stairs?  Do not leave any items on the stairs.  Make sure that there are handrails on both sides of the stairs and use them. Fix handrails that are broken or loose. Make sure that handrails are as long as the stairways.  Check any carpeting to make sure that it is firmly attached to the stairs. Fix any carpet that is loose or worn.  Avoid having throw rugs at the top or bottom of the stairs. If you do have throw rugs, attach them to the floor with carpet tape.  Make sure that you have a light switch at the top of the stairs and the bottom of the stairs. If you do not have them, ask someone to add them for you. What else can I do to help prevent falls?  Wear shoes that:  Do not have high heels.  Have rubber bottoms.  Are  comfortable and fit you well.  Are closed at the toe. Do not wear sandals.  If you use a stepladder:  Make sure that it is fully opened. Do not climb a closed stepladder.  Make sure that both sides of the stepladder are locked into place.  Ask someone to hold it for you, if possible.  Clearly mark and make sure that you can see:  Any grab bars or handrails.  First and last steps.  Where the edge of each step is.  Use tools that help you move around (mobility aids) if they are needed. These include:  Canes.  Walkers.  Scooters.  Crutches.  Turn on the lights when you go into a dark area. Replace any light bulbs as soon as they burn out.  Set up your furniture so you have a clear path. Avoid moving your furniture around.  If any of your floors are uneven, fix them.  If there are any pets around you, be aware of where they are.  Review your medicines with your doctor. Some medicines can make you feel dizzy. This can increase your chance of falling. Ask your doctor what other things that you can do to help prevent falls. This information is not intended to replace advice given to you by your health care provider. Make sure you discuss any questions you have with your health care provider. Document Released: 12/14/2008 Document Revised: 07/26/2015 Document Reviewed: 03/24/2014 Elsevier Interactive Patient Education  2017 [employer].

## 2019-03-27 ENCOUNTER — Other Ambulatory Visit: Payer: Self-pay | Admitting: Family Medicine

## 2019-03-27 DIAGNOSIS — E785 Hyperlipidemia, unspecified: Secondary | ICD-10-CM

## 2019-04-12 ENCOUNTER — Other Ambulatory Visit: Payer: Self-pay | Admitting: Family Medicine

## 2019-04-12 DIAGNOSIS — E785 Hyperlipidemia, unspecified: Secondary | ICD-10-CM

## 2019-04-13 NOTE — Telephone Encounter (Signed)
Requested Prescriptions  Pending Prescriptions Disp Refills  . rosuvastatin (CRESTOR) 10 MG tablet [Pharmacy Med Name: ROSUVASTATIN  10MG   TAB] 90 tablet 3    Sig: TAKE 1 TABLET BY MOUTH  DAILY     Cardiovascular:  Antilipid - Statins Failed - 04/12/2019  9:17 PM      Failed - Total Cholesterol in normal range and within 360 days    Cholesterol, Total  Date Value Ref Range Status  08/15/2015 186 100 - 199 mg/dL Final   Cholesterol  Date Value Ref Range Status  04/12/2018 155 <200 mg/dL Final         Failed - LDL in normal range and within 360 days    LDL Cholesterol (Calc)  Date Value Ref Range Status  04/12/2018 82 mg/dL (calc) Final    Comment:    Reference range: <100 . Desirable range <100 mg/dL for primary prevention;   <70 mg/dL for patients with CHD or diabetic patients  with > or = 2 CHD risk factors. 06/11/2018 LDL-C is now calculated using the Martin-Hopkins  calculation, which is a validated novel method providing  better accuracy than the Friedewald equation in the  estimation of LDL-C.  Marland Kitchen et al. Horald Pollen. Lenox Ahr): 2061-2068  (http://education.[facility].com/faq/FAQ164)          Failed - HDL in normal range and within 360 days    HDL  Date Value Ref Range Status  04/12/2018 55 > OR = 50 mg/dL Final  06/11/2018 81 02/58/5277 mg/dL Final         Failed - Triglycerides in normal range and within 360 days    Triglycerides  Date Value Ref Range Status  04/12/2018 88 <150 mg/dL Final         Passed - Patient is not pregnant      Passed - Valid encounter within last 12 months    Recent Outpatient Visits          4 months ago Benign essential HTN   [facility] [place], [place], MD   9 months ago Morbid obesity due to excess calories [facility])   [facility], MD   1 year ago Morbid obesity due to excess calories [facility])   [facility], MD   1 year ago Encounter for Medicare  annual wellness exam   [facility] [place], KÖSSEN, LPN   1 year ago Dyslipidemia   [facility], MD      Future Appointments            In 5 days [name], MD [facility]   In 11 months  [facility]

## 2019-04-18 ENCOUNTER — Encounter: Payer: Self-pay | Admitting: Family Medicine

## 2019-04-18 ENCOUNTER — Ambulatory Visit (INDEPENDENT_AMBULATORY_CARE_PROVIDER_SITE_OTHER): Payer: Medicare Other | Admitting: Family Medicine

## 2019-04-18 ENCOUNTER — Other Ambulatory Visit: Payer: Self-pay

## 2019-04-18 VITALS — BP 136/76 | HR 78 | Temp 96.8°F | Resp 16 | Ht 63.0 in | Wt 197.4 lb

## 2019-04-18 DIAGNOSIS — I10 Essential (primary) hypertension: Secondary | ICD-10-CM | POA: Diagnosis not present

## 2019-04-18 DIAGNOSIS — R7301 Impaired fasting glucose: Secondary | ICD-10-CM | POA: Diagnosis not present

## 2019-04-18 DIAGNOSIS — E785 Hyperlipidemia, unspecified: Secondary | ICD-10-CM

## 2019-04-18 DIAGNOSIS — E669 Obesity, unspecified: Secondary | ICD-10-CM

## 2019-04-18 DIAGNOSIS — R718 Other abnormality of red blood cells: Secondary | ICD-10-CM

## 2019-04-18 DIAGNOSIS — M170 Bilateral primary osteoarthritis of knee: Secondary | ICD-10-CM | POA: Diagnosis not present

## 2019-04-18 MED ORDER — OLMESARTAN MEDOXOMIL-HCTZ 40-25 MG PO TABS: 1.0000 | ORAL_TABLET | Freq: Every day | ORAL | 1 refills | Status: DC

## 2019-04-18 NOTE — Progress Notes (Addendum)
Name: Lisa Steele   MRN: [id number]    DOB: Apr 15, 1945   Date:04/18/2019       Progress Note  Subjective  Chief Complaint  Chief Complaint  Patient presents with  . Medication Refill  . Hypertension  . Hyperlipidemia  . Obesity    HPI  Obesity:weight is stable since last year, she has not been physically active secondary to knee pain, but she is still eating healthy.   HTN: taking bp medication daily. She states bp at home has been in the 120's/130's, she denies chest pain , SOB or palpitation.Denies dizziness.No side effects of medication   Hyperlipidemia: taking Crestor, no myalgias, compliant with medications.Last LDL 82 showed improvement   Rotator cuff repair: s/p rotator cuff repair in 11/2017 but pain was still present and had multiple sessions of PT and is doing better, she stopped PT because of COVID-19 but is doing home exercises she is now able to reach over 90 degree , and discomfort only above 100 degrees   Elevated glucose: family history of DM, previous hyperglycemia on labs, last hgbA1C was4.9%,but elevated fasting insulin and fasting glucose also slightly. Denies polyphagia, polydipsia or polyuria   OA both knees: She has seen Dr. Rudene Christians in the past , she recently saw his PA and had steroids injection on both knees January 2021, still has daily pain worse on the right side No effusion on knees. She stopped using exercise bike because of pain, but she is trying to stretch every morning  Recurrent sore throat: she states she has a long history of sore throats, however since Dec it has been more often, she noticed that once she stopped drinking wine the pain resolved, explained likely from GERD   Patient Active Problem List   Diagnosis Date Noted  . Trigger finger of left thumb 03/04/2018  . Left hand pain 02/03/2018  . S/P rotator cuff repair 01/12/2018  . Injury of left rotator cuff 11/03/2017  . Obesity (BMI 30.0-34.9) 02/19/2017  . Primary  osteoarthritis of both knees 06/22/2014  . Obesity (BMI 30-39.9) 06/22/2014  . Dysmetabolic syndrome 12/25/8525  . Post-menopausal 06/22/2014  . H/O cold sores 06/22/2014  . Dyslipidemia 06/22/2014  . Elevated fasting blood sugar 06/22/2014  . Benign essential HTN 06/22/2014  . Abnormal ECG 06/22/2014    Past Surgical History:  Procedure Laterality Date  . APPENDECTOMY    . BREAST EXCISIONAL BIOPSY Left 1970   neg  . BREAST SURGERY Left    cyst removed  . BUNIONECTOMY    . CATARACT EXTRACTION, BILATERAL  10/2013  . COLONOSCOPY WITH PROPOFOL N/A 06/25/2016   Procedure: COLONOSCOPY WITH PROPOFOL;  Surgeon: Manya Silvas, MD;  Location[facility] ENDOSCOPY;  Service: Endoscopy;  Laterality: N/A;  . ECTOPIC PREGNANCY SURGERY    . EYE SURGERY    . SHOULDER ARTHROSCOPY WITH ROTATOR CUFF REPAIR AND SUBACROMIAL DECOMPRESSION Left 11/25/2017   Procedure: SHOULDER ARTHROSCOPic release of long head biceps tendon and mini open rotator cuff repair;  Surgeon: Leanor Kail, MD;  Location: [facility] ORS;  Service: Orthopedics;  Laterality: Left;  . TONSILECTOMY, ADENOIDECTOMY, BILATERAL MYRINGOTOMY AND TUBES    . TONSILLECTOMY      Family History  Problem Relation Age of Onset  . Diabetes Mother   . Hypertension Mother   . Heart disease Mother   . Hyperlipidemia Mother   . Heart disease Father   . Diabetes Sister   . Heart disease Sister   . Hypertension Sister   . Hyperlipidemia Sister   .  Breast cancer Sister 59  . Cancer Sister 4       breast  . Breast cancer Maternal Aunt   . Diabetes Sister   . Stroke Sister   . Diabetes Sister   . Diabetes Sister     Social History   Tobacco Use  . Smoking status: Former Smoker    Years: 10.00    Types: Cigarettes    Start date: 03/03/1958  . Smokeless tobacco: Never Used  Substance Use Topics  . Alcohol use: Yes    Alcohol/week: 0.0 standard drinks    Comment: socially  . Drug use: No     Current Outpatient Medications:  .   aspirin EC 81 MG tablet, Take 1 tablet (81 mg total) by mouth daily. (Patient taking differently: Take 81 mg by mouth every 6 (six) hours as needed (Knee pain). ), Disp: 90 tablet, Rfl: 3 .  meloxicam (MOBIC) 15 MG tablet, Take 1 tablet by mouth daily., Disp: , Rfl:  .  olmesartan-hydrochlorothiazide (BENICAR HCT) 40-25 MG tablet, Take 1 tablet by mouth daily., Disp: 90 tablet, Rfl: 1 .  rosuvastatin (CRESTOR) 10 MG tablet, TAKE 1 TABLET BY MOUTH  DAILY, Disp: 90 tablet, Rfl: 3  Allergies  Allergen Reactions  . Tramadol Nausea And Vomiting  . Doxycycline Itching  . Penicillins Hives and Rash    leg lesions and weakness (couldn't walk)    I personally reviewed active problem list, medication list, allergies, family history, social history, health maintenance with the patient/caregiver today.   ROS  Constitutional: Negative for fever or weight change.  Respiratory: Negative for cough and shortness of breath.   Cardiovascular: Negative for chest pain or palpitations.  Gastrointestinal: Negative for abdominal pain, no bowel changes.  Musculoskeletal: Negative for gait problem or joint swelling.  Skin: Negative for rash.  Neurological: Negative for dizziness or headache.  No other specific complaints in a complete review of systems (except as listed in HPI above).  Objective  Vitals:   04/18/19 0930  BP: 136/76  Pulse: 78  Resp: 16  Temp: (!) 96.8 F (36 C)  TempSrc: Temporal  SpO2: 99%  Weight: 197 lb 6.4 oz (89.5 kg)  Height: 5\' 3"  (1.6 m)    Body mass index is 34.97 kg/m.  Physical Exam  Constitutional: Patient appears well-developed and well-nourished. Obese No distress.  HEENT: head atraumatic, normocephalic, pupils equal and reactive to light Cardiovascular: Normal rate, regular rhythm and normal heart sounds.  No murmur heard. No BLE edema. Pulmonary/Chest: Effort normal and breath sounds normal. No respiratory distress. Abdominal: Soft.  There is no  tenderness. Muscular skeletal: mild effusion of right knee, crepitus with extension of both knees  Psychiatric: Patient has a normal mood and affect. behavior is normal. Judgment and thought content normal.  PHQ2/9: Depression screen [facility] 2/9 04/18/2019 03/15/2019 11/15/2018 07/13/2018 04/12/2018  Decreased Interest 0 0 0 0 0  Down, Depressed, Hopeless 0 0 0 0 0  PHQ - 2 Score 0 0 0 0 0  Altered sleeping 0 - 0 0 -  Tired, decreased energy 0 - 0 0 -  Change in appetite 0 - 0 0 -  Feeling bad or failure about yourself  0 - 0 0 -  Trouble concentrating 0 - 0 0 -  Moving slowly or fidgety/restless 0 - 0 0 -  Suicidal thoughts 0 - 0 0 -  PHQ-9 Score 0 - 0 0 -  Difficult doing work/chores Not difficult at all - - - -  phq 9 is negative   Fall Risk: Fall Risk  04/18/2019 03/15/2019 11/15/2018 07/13/2018 04/12/2018  Falls in the past year? 0 0 0 0 0  Number falls in past yr: 0 0 0 0 0  Comment - - - - -  Injury with Fall? 0 0 0 0 0  Comment - - - - -  Risk for fall due to : - Orthopedic patient - - -  Follow up - Falls prevention discussed - - -       Functional Status Survey: Is the patient deaf or have difficulty hearing?: No Does the patient have difficulty seeing, even when wearing glasses/contacts?: No Does the patient have difficulty concentrating, remembering, or making decisions?: No Does the patient have difficulty walking or climbing stairs?: No Does the patient have difficulty dressing or bathing?: No Does the patient have difficulty doing errands alone such as visiting a doctor's office or shopping?: No    Assessment & Plan  1. Benign essential HTN  - COMPLETE METABOLIC PANEL WITH GFR - CBC with Differential/Platelet  2. Dyslipidemia  - Lipid panel  3. Obesity (BMI 30.0-34.9)  Discussed with the patient the risk posed by an increased BMI. Discussed importance of portion control, calorie counting and at least 150 minutes of physical activity weekly. Avoid sweet  beverages and drink more water. Eat at least 6 servings of fruit and vegetables daily   4. Primary osteoarthritis of both knees  Seeing Dr. Rosita Kea  5. Elevated fasting blood sugar  - Hemoglobin A1c  6. Elevated red blood cell count  - CBC with Differential/Platelet

## 2019-04-18 NOTE — Addendum Note (Signed)
Addended by: [name] F on: 04/18/2019 10:07 AM   Modules accepted: Orders

## 2019-04-19 ENCOUNTER — Other Ambulatory Visit: Payer: Self-pay | Admitting: Family Medicine

## 2019-04-19 DIAGNOSIS — D709 Neutropenia, unspecified: Secondary | ICD-10-CM

## 2019-04-19 DIAGNOSIS — R718 Other abnormality of red blood cells: Secondary | ICD-10-CM

## 2019-04-19 LAB — LIPID PANEL
Cholesterol: 165 mg/dL (ref <200)
HDL: 60 mg/dL (ref >=50)
LDL Cholesterol (Calc): 86 mg/dL (calc)
Non-HDL Cholesterol (Calc): 105 mg/dL (calc) (ref <130)
Total CHOL/HDL Ratio: 2.8 (calc) (ref <5.0)
Triglycerides: 93 mg/dL (ref <150)

## 2019-04-19 LAB — COMPLETE METABOLIC PANEL WITH GFR
AG Ratio: 1.5 (calc) (ref 1.0–2.5)
ALT: 13 U/L (ref 6–29)
AST: 18 U/L (ref 10–35)
Albumin: 4.4 g/dL (ref 3.6–5.1)
Alkaline phosphatase (APISO): 53 U/L (ref 37–153)
BUN/Creatinine Ratio: 18 (calc) (ref 6–22)
BUN: 17 mg/dL (ref 7–25)
CO2: 29 mmol/L (ref 20–32)
Calcium: 10 mg/dL (ref 8.6–10.4)
Chloride: 100 mmol/L (ref 98–110)
Creat: 0.94 mg/dL — ABNORMAL HIGH (ref 0.60–0.93)
GFR, Est African American: 70 mL/min/{1.73_m2} (ref >=60)
GFR, Est Non African American: 61 mL/min/{1.73_m2} (ref >=60)
Globulin: 2.9 g/dL (calc) (ref 1.9–3.7)
Glucose, Bld: 115 mg/dL — ABNORMAL HIGH (ref 65–99)
Potassium: 3.8 mmol/L (ref 3.5–5.3)
Sodium: 139 mmol/L (ref 135–146)
Total Bilirubin: 0.4 mg/dL (ref 0.2–1.2)
Total Protein: 7.3 g/dL (ref 6.1–8.1)

## 2019-04-19 LAB — CBC WITH DIFFERENTIAL/PLATELET
Absolute Monocytes: 698 cells/uL (ref 200–950)
Basophils Absolute: 61 cells/uL (ref 0–200)
Basophils Relative: 1.7 %
Eosinophils Absolute: 158 cells/uL (ref 15–500)
Eosinophils Relative: 4.4 %
HCT: 45 % (ref 35.0–45.0)
Hemoglobin: 14.8 g/dL (ref 11.7–15.5)
Lymphs Abs: 1699 cells/uL (ref 850–3900)
MCH: 27.7 pg (ref 27.0–33.0)
MCHC: 32.9 g/dL (ref 32.0–36.0)
MCV: 84.3 fL (ref 80.0–100.0)
MPV: 11.8 fL (ref 7.5–12.5)
Monocytes Relative: 19.4 %
Neutro Abs: 983 cells/uL — ABNORMAL LOW (ref 1500–7800)
Neutrophils Relative %: 27.3 %
Platelets: 290 10*3/uL (ref 140–400)
RBC: 5.34 10*6/uL — ABNORMAL HIGH (ref 3.80–5.10)
RDW: 14.3 % (ref 11.0–15.0)
Total Lymphocyte: 47.2 %
WBC: 3.6 10*3/uL — ABNORMAL LOW (ref 3.8–10.8)

## 2019-04-19 LAB — HEMOGLOBIN A1C
Hgb A1c MFr Bld: 4.9 % of total Hgb (ref <5.7)
Mean Plasma Glucose: 94 (calc)
eAG (mmol/L): 5.2 (calc)

## 2019-04-25 ENCOUNTER — Encounter: Payer: Self-pay | Admitting: Family Medicine

## 2019-05-03 ENCOUNTER — Other Ambulatory Visit: Payer: Self-pay

## 2019-05-03 ENCOUNTER — Ambulatory Visit (INDEPENDENT_AMBULATORY_CARE_PROVIDER_SITE_OTHER): Payer: Medicare Other | Admitting: Podiatry

## 2019-05-03 ENCOUNTER — Other Ambulatory Visit: Payer: Self-pay | Admitting: Podiatry

## 2019-05-03 ENCOUNTER — Ambulatory Visit (INDEPENDENT_AMBULATORY_CARE_PROVIDER_SITE_OTHER): Payer: Medicare Other

## 2019-05-03 DIAGNOSIS — S86312A Strain of muscle(s) and tendon(s) of peroneal muscle group at lower leg level, left leg, initial encounter: Secondary | ICD-10-CM

## 2019-05-03 DIAGNOSIS — M779 Enthesopathy, unspecified: Secondary | ICD-10-CM

## 2019-05-03 NOTE — Progress Notes (Signed)
   HPI: 74 y.o. female presenting today as a new patient for evaluation of pain to the lateral aspect of the left foot.  Patient states the pain is 10/10 at times.  She presents today in regards to getting a second opinion.  She has been seen at [facility] clinic, Dr. Orland Jarred for 3 visits without any significant improvement.  Patient has been treated with anti-inflammatory steroid injections, immobilization in a cam boot, oral anti-inflammatories, and she has been icing her foot.  She experiences constant pain for the past 6 months without any significant improvement of symptoms.  She presents today for further treatment evaluation  Past Medical History:  Diagnosis Date  . Cataracts, bilateral   . Hyperlipidemia   . Hypertension   . Joint pain    both knees     Physical Exam: General: The patient is alert and oriented x3 in no acute distress.  Dermatology: Skin is warm, dry and supple bilateral lower extremities. Negative for open lesions or macerations.  Vascular: Palpable pedal pulses bilaterally. No edema or erythema noted. Capillary refill within normal limits.  Neurological: Epicritic and protective threshold grossly intact bilaterally.   Musculoskeletal Exam: Range of motion within normal limits to all pedal and ankle joints bilateral. Muscle strength 5/5 in all groups bilateral.  Tenderness to palpation at the insertion of the peroneal tendon at the fifth metatarsal tubercle left foot  Radiographic Exam:  Normal osseous mineralization. Joint spaces preserved. No fracture/dislocation/boney destruction.    Assessment: 1.  Possible insertional peroneal tendon tear, nontraumatic left foot   Plan of Care:  1. Patient evaluated. X-Rays reviewed.  2.  Today we are going to order MRI left foot to rule out peroneal tendon tear.  Patient is been treated conservatively for 6 months without any improvement of symptoms.  Patient has exhausted all conservative modalities. 3.  Compression  anklet dispensed 4.  Return to clinic after MRI to review results and discuss further treatment options      Felecia Shelling, DPM [facility]  Dr. Felecia Shelling, DPM    [address] [place], [place] [address]                Office [phone]  Fax [phone]

## 2019-05-05 ENCOUNTER — Telehealth: Payer: Self-pay

## 2019-05-05 DIAGNOSIS — S86312A Strain of muscle(s) and tendon(s) of peroneal muscle group at lower leg level, left leg, initial encounter: Secondary | ICD-10-CM

## 2019-05-05 NOTE — Telephone Encounter (Signed)
Per [facility] website, no precert required per Medicare guidelines.   Patient has been notified and requested to go to [facility].  Order has been entered in chart and patient will call to schedule to her convenience.

## 2019-05-05 NOTE — Telephone Encounter (Signed)
-----   Message from Felecia Shelling, DPM sent at 05/03/2019  2:52 PM EST ----- Regarding: MRI left foot Please order MRI left foot w/out contrast  Dx: insertional peroneal tendon tear left  Thanks, Dr. Logan Bores

## 2019-05-06 NOTE — Telephone Encounter (Signed)
Patient called and requested to go to [facility] because they could get her in sooner.  New order has been entered in chart.

## 2019-05-06 NOTE — Addendum Note (Signed)
Addended by: Geraldine Contras D on: 05/06/2019 03:48 PM   Modules accepted: Orders

## 2019-05-06 NOTE — Addendum Note (Signed)
Addended by: Geraldine Contras D on: 05/06/2019 03:54 PM   Modules accepted: Orders

## 2019-05-09 ENCOUNTER — Telehealth: Payer: Self-pay | Admitting: *Deleted

## 2019-05-09 NOTE — Telephone Encounter (Signed)
-----   Message from Constance Haw, LPN sent at 04/10/7865  8:30 AM EST ----- Patient is schedule for MRI on 05/18/2019 and is requesting something to relax her.  Please let me know and I will send in to pharmacy.  Thanks

## 2019-05-09 NOTE — Telephone Encounter (Signed)
One versed.

## 2019-05-10 MED ORDER — LORAZEPAM 1 MG PO TABS: ORAL_TABLET | ORAL | 0 refills | Status: DC

## 2019-05-10 NOTE — Telephone Encounter (Signed)
Per Dr. Logan Bores, script for Ativan 1mg  was called into pharmacy and patient has been notified.

## 2019-05-12 ENCOUNTER — Other Ambulatory Visit: Payer: Medicare Other

## 2019-05-18 ENCOUNTER — Other Ambulatory Visit: Payer: Self-pay

## 2019-05-18 ENCOUNTER — Ambulatory Visit
Admission: RE | Admit: 2019-05-18 | Discharge: 2019-05-18 | Disposition: A | Discharge status: Home / Self Care | Payer: Medicare Other | Source: Ambulatory Visit | Attending: Podiatry | Admitting: Podiatry

## 2019-05-18 DIAGNOSIS — S93432A Sprain of tibiofibular ligament of left ankle, initial encounter: Secondary | ICD-10-CM | POA: Diagnosis not present

## 2019-05-18 DIAGNOSIS — S86312A Strain of muscle(s) and tendon(s) of peroneal muscle group at lower leg level, left leg, initial encounter: Secondary | ICD-10-CM | POA: Diagnosis not present

## 2019-05-27 ENCOUNTER — Ambulatory Visit: Payer: Medicare Other | Admitting: Podiatry

## 2019-05-27 DIAGNOSIS — M160 Bilateral primary osteoarthritis of hip: Secondary | ICD-10-CM | POA: Diagnosis not present

## 2019-05-27 DIAGNOSIS — M1711 Unilateral primary osteoarthritis, right knee: Secondary | ICD-10-CM | POA: Diagnosis not present

## 2019-06-01 ENCOUNTER — Other Ambulatory Visit: Payer: Medicare Other

## 2019-06-06 ENCOUNTER — Other Ambulatory Visit: Payer: Self-pay | Admitting: Family Medicine

## 2019-06-06 DIAGNOSIS — R1031 Right lower quadrant pain: Secondary | ICD-10-CM

## 2019-06-06 DIAGNOSIS — R590 Localized enlarged lymph nodes: Secondary | ICD-10-CM

## 2019-06-07 ENCOUNTER — Other Ambulatory Visit: Payer: Self-pay | Admitting: Orthopedic Surgery

## 2019-06-07 ENCOUNTER — Encounter
Admission: RE | Admit: 2019-06-07 | Discharge: 2019-06-07 | Disposition: A | Discharge status: Home / Self Care | Payer: Medicare Other | Source: Ambulatory Visit | Attending: Orthopedic Surgery | Admitting: Orthopedic Surgery

## 2019-06-07 ENCOUNTER — Ambulatory Visit (INDEPENDENT_AMBULATORY_CARE_PROVIDER_SITE_OTHER): Payer: Medicare Other | Admitting: Podiatry

## 2019-06-07 ENCOUNTER — Other Ambulatory Visit: Payer: Self-pay

## 2019-06-07 DIAGNOSIS — S86312A Strain of muscle(s) and tendon(s) of peroneal muscle group at lower leg level, left leg, initial encounter: Secondary | ICD-10-CM | POA: Diagnosis not present

## 2019-06-07 DIAGNOSIS — Z01818 Encounter for other preprocedural examination: Secondary | ICD-10-CM | POA: Insufficient documentation

## 2019-06-07 NOTE — Patient Instructions (Signed)
Your procedure is scheduled on: Thursday 06/16/19.  Report to DAY SURGERY DEPARTMENT LOCATED ON 2ND FLOOR MEDICAL MALL ENTRANCE. To find out your arrival time please call [phone] between 1PM - 3PM on Wednesday 06/15/19.    Remember: Instructions that are not followed completely may result in serious medical risk, up to and including death, or upon the discretion of your surgeon and anesthesiologist your surgery may need to be rescheduled.      _X__ 1. Do not eat food after midnight the night before your procedure.                 No gum chewing or hard candies. You may drink clear liquids up to 2 hours                 before you are scheduled to arrive for your surgery- DO NOT drink clear                 liquids within 2 hours of the start of your surgery.                 Clear Liquids include:  water, apple juice without pulp, clear carbohydrate                 drink such as Clearfast or Gatorade, Black Coffee or Tea (Do not add                 anything to coffee or tea).    __X__2.  On the morning of surgery brush your teeth with toothpaste and water, you may rinse your mouth with mouthwash if you wish.  Do not swallow any toothpaste or mouthwash.       _X__ 3.  No Alcohol for 24 hours before or after surgery.     __X__4.  Notify your doctor if there is any change in your medical condition      (cold, fever, infections).       Do not wear jewelry, make-up, hairpins, clips or nail polish. Do not wear lotions, powders, or perfumes.  Do not shave 48 hours prior to surgery. Men may shave face and neck. Do not bring valuables to the hospital.      [facility] is not responsible for any belongings or valuables.    Contacts, dentures/partials or body piercings may not be worn into surgery. Bring a case for your contacts, glasses or hearing aids, a denture cup will be supplied. For patients admitted to the hospital, discharge time is determined by your treatment  team.      __X__ Take these medicines the morning of surgery with A SIP OF WATER:     1. NONE      __X__ Use CHG Soap as directed     __X__ Stop Blood Thinners: Aspirin 5 days prior to your procedure. Your last dose will be on Friday 06/10/19.    __X__ Stop Anti-inflammatories 7 days before surgery such as Advil, Ibuprofen, Motrin, BC or Goodies Powder, Naprosyn, Naproxen, Aleve, Aspirin, Meloxicam. May take Tylenol if needed for pain or discomfort.     __X__ Don't start any new herbal supplements or vitamins prior to your surgery.

## 2019-06-08 ENCOUNTER — Encounter
Admission: RE | Admit: 2019-06-08 | Discharge: 2019-06-08 | Disposition: A | Discharge status: Home / Self Care | Payer: Medicare Other | Source: Ambulatory Visit | Attending: Orthopedic Surgery | Admitting: Orthopedic Surgery

## 2019-06-08 ENCOUNTER — Other Ambulatory Visit: Payer: Self-pay | Admitting: Obstetrics and Gynecology

## 2019-06-08 DIAGNOSIS — Z01810 Encounter for preprocedural cardiovascular examination: Secondary | ICD-10-CM | POA: Diagnosis not present

## 2019-06-08 DIAGNOSIS — Z01818 Encounter for other preprocedural examination: Secondary | ICD-10-CM | POA: Diagnosis not present

## 2019-06-08 DIAGNOSIS — Z1231 Encounter for screening mammogram for malignant neoplasm of breast: Secondary | ICD-10-CM

## 2019-06-08 LAB — URINALYSIS, ROUTINE W REFLEX MICROSCOPIC
Bilirubin Urine: NEGATIVE
Glucose, UA: NEGATIVE mg/dL
Hgb urine dipstick: NEGATIVE
Ketones, ur: NEGATIVE mg/dL
Leukocytes,Ua: NEGATIVE
Nitrite: NEGATIVE
Protein, ur: NEGATIVE mg/dL
Specific Gravity, Urine: 1.003 — ABNORMAL LOW (ref 1.005–1.030)
pH: 7 (ref 5.0–8.0)

## 2019-06-08 LAB — COMPREHENSIVE METABOLIC PANEL
ALT: 17 U/L (ref 0–44)
AST: 22 U/L (ref 15–41)
Albumin: 4.6 g/dL (ref 3.5–5.0)
Alkaline Phosphatase: 64 U/L (ref 38–126)
Anion gap: 10 (ref 5–15)
BUN: 20 mg/dL (ref 8–23)
CO2: 27 mmol/L (ref 22–32)
Calcium: 9.4 mg/dL (ref 8.9–10.3)
Chloride: 100 mmol/L (ref 98–111)
Creatinine, Ser: 0.98 mg/dL (ref 0.44–1.00)
GFR calc Af Amer: >60 mL/min (ref >60)
GFR calc non Af Amer: 57 mL/min — ABNORMAL LOW (ref >60)
Glucose, Bld: 90 mg/dL (ref 70–99)
Potassium: 3.5 mmol/L (ref 3.5–5.1)
Sodium: 137 mmol/L (ref 135–145)
Total Bilirubin: 0.7 mg/dL (ref 0.3–1.2)
Total Protein: 7.8 g/dL (ref 6.5–8.1)

## 2019-06-08 LAB — CBC WITH DIFFERENTIAL/PLATELET
Abs Immature Granulocytes: 0.01 10*3/uL (ref 0.00–0.07)
Basophils Absolute: 0.1 10*3/uL (ref 0.0–0.1)
Basophils Relative: 1 %
Eosinophils Absolute: 0 10*3/uL (ref 0.0–0.5)
Eosinophils Relative: 1 %
HCT: 43.9 % (ref 36.0–46.0)
Hemoglobin: 14.7 g/dL (ref 12.0–15.0)
Immature Granulocytes: 0 %
Lymphocytes Relative: 42 %
Lymphs Abs: 2.6 10*3/uL (ref 0.7–4.0)
MCH: 27.5 pg (ref 26.0–34.0)
MCHC: 33.5 g/dL (ref 30.0–36.0)
MCV: 82.2 fL (ref 80.0–100.0)
Monocytes Absolute: 0.7 10*3/uL (ref 0.1–1.0)
Monocytes Relative: 12 %
Neutro Abs: 2.7 10*3/uL (ref 1.7–7.7)
Neutrophils Relative %: 44 %
Platelets: 384 10*3/uL (ref 150–400)
RBC: 5.34 MIL/uL — ABNORMAL HIGH (ref 3.87–5.11)
RDW: 15.3 % (ref 11.5–15.5)
WBC: 6.1 10*3/uL (ref 4.0–10.5)
nRBC: 0 % (ref 0.0–0.2)

## 2019-06-08 LAB — TYPE AND SCREEN
ABO/RH(D): A POS
Antibody Screen: NEGATIVE

## 2019-06-08 LAB — SURGICAL PCR SCREEN
MRSA, PCR: NEGATIVE
Staphylococcus aureus: NEGATIVE

## 2019-06-10 NOTE — Progress Notes (Signed)
   HPI: 74 y.o. female presenting today for follow up evaluation of left foot pain. She reports continued mild aching. She has been using the compression anklet as directed. There are no worsening factors noted. She is scheduled to have a total right hip replacement of 06/16/2019. Patient is here for further evaluation and treatment.  Past Medical History:  Diagnosis Date  . Cataracts, bilateral   . Hyperlipidemia   . Hypertension   . Joint pain    both knees     Physical Exam: General: The patient is alert and oriented x3 in no acute distress.  Dermatology: Skin is warm, dry and supple bilateral lower extremities. Negative for open lesions or macerations.  Vascular: Palpable pedal pulses bilaterally. No edema or erythema noted. Capillary refill within normal limits.  Neurological: Epicritic and protective threshold grossly intact bilaterally.   Musculoskeletal Exam: Range of motion within normal limits to all pedal and ankle joints bilateral. Muscle strength 5/5 in all groups bilateral.  Tenderness to palpation at the insertion of the peroneal tendon at the fifth metatarsal tubercle left foot  MRI Impression done on 05/18/2019: Mild posterior tibialis tenosynovitis.  Intrasubstance sprain of the anterior tibiofibular ligament, however it is intact.  Degenerative changes seen at the tibia-fibular articulation with subchondral cystic changes.  Assessment: 1.  Possible insertional peroneal tendon tear, nontraumatic left foot   Plan of Care:  1. Patient evaluated. MRI reviewed.  2. Patient scheduled for right total hip replacement on 06/16/2019.  3. Continue conservative treatment.  4. Once patient has recovered postoperatively, we will address left foot.  5. Return to clinic as needed when ready to address left foot.       Felecia Shelling, DPM [facility]  Dr. Felecia Shelling, DPM    [address] [place], [place]  [address]                Office [phone]  Fax [phone]

## 2019-06-14 ENCOUNTER — Other Ambulatory Visit
Admission: RE | Admit: 2019-06-14 | Discharge: 2019-06-14 | Disposition: A | Discharge status: Home / Self Care | Payer: Medicare Other | Source: Ambulatory Visit | Attending: Orthopedic Surgery | Admitting: Orthopedic Surgery

## 2019-06-14 DIAGNOSIS — Z20822 Contact with and (suspected) exposure to covid-19: Secondary | ICD-10-CM | POA: Diagnosis not present

## 2019-06-14 DIAGNOSIS — Z01812 Encounter for preprocedural laboratory examination: Secondary | ICD-10-CM | POA: Diagnosis not present

## 2019-06-14 LAB — SARS CORONAVIRUS 2 (TAT 6-24 HRS): SARS Coronavirus 2: NEGATIVE

## 2019-06-15 MED ORDER — CLINDAMYCIN PHOSPHATE 900 MG/50ML IV SOLN
900.0000 mg | INTRAVENOUS | Status: AC
  Administered 2019-06-16: 08:00:00 900 mg via INTRAVENOUS

## 2019-06-16 ENCOUNTER — Observation Stay: Payer: Medicare Other

## 2019-06-16 ENCOUNTER — Encounter
Admission: RE | Disposition: A | Discharge status: Home Health | Payer: Self-pay | Source: Home / Self Care | Attending: Orthopedic Surgery

## 2019-06-16 ENCOUNTER — Inpatient Hospital Stay: Payer: Medicare Other

## 2019-06-16 ENCOUNTER — Inpatient Hospital Stay: Payer: Medicare Other | Admitting: Certified Registered Nurse Anesthetist

## 2019-06-16 ENCOUNTER — Encounter: Payer: Self-pay | Admitting: Orthopedic Surgery

## 2019-06-16 ENCOUNTER — Inpatient Hospital Stay
Admission: RE | Admit: 2019-06-16 | Discharge: 2019-06-19 | DRG: 470 | Disposition: A | Discharge status: Home Health | Payer: Medicare Other | Attending: Orthopedic Surgery | Admitting: Orthopedic Surgery

## 2019-06-16 ENCOUNTER — Other Ambulatory Visit: Payer: Self-pay

## 2019-06-16 DIAGNOSIS — R110 Nausea: Secondary | ICD-10-CM | POA: Diagnosis not present

## 2019-06-16 DIAGNOSIS — M160 Bilateral primary osteoarthritis of hip: Principal | ICD-10-CM | POA: Diagnosis present

## 2019-06-16 DIAGNOSIS — Z885 Allergy status to narcotic agent status: Secondary | ICD-10-CM

## 2019-06-16 DIAGNOSIS — M170 Bilateral primary osteoarthritis of knee: Secondary | ICD-10-CM | POA: Diagnosis present

## 2019-06-16 DIAGNOSIS — Z471 Aftercare following joint replacement surgery: Secondary | ICD-10-CM | POA: Diagnosis not present

## 2019-06-16 DIAGNOSIS — Z96641 Presence of right artificial hip joint: Secondary | ICD-10-CM

## 2019-06-16 DIAGNOSIS — Z86010 Personal history of colonic polyps: Secondary | ICD-10-CM

## 2019-06-16 DIAGNOSIS — Z419 Encounter for procedure for purposes other than remedying health state, unspecified: Secondary | ICD-10-CM

## 2019-06-16 DIAGNOSIS — M1991 Primary osteoarthritis, unspecified site: Secondary | ICD-10-CM | POA: Diagnosis present

## 2019-06-16 DIAGNOSIS — E785 Hyperlipidemia, unspecified: Secondary | ICD-10-CM | POA: Diagnosis present

## 2019-06-16 DIAGNOSIS — G8918 Other acute postprocedural pain: Secondary | ICD-10-CM

## 2019-06-16 DIAGNOSIS — Z6832 Body mass index (BMI) 32.0-32.9, adult: Secondary | ICD-10-CM

## 2019-06-16 DIAGNOSIS — E7800 Pure hypercholesterolemia, unspecified: Secondary | ICD-10-CM | POA: Diagnosis present

## 2019-06-16 DIAGNOSIS — Z880 Allergy status to penicillin: Secondary | ICD-10-CM

## 2019-06-16 DIAGNOSIS — Z823 Family history of stroke: Secondary | ICD-10-CM

## 2019-06-16 DIAGNOSIS — Z8249 Family history of ischemic heart disease and other diseases of the circulatory system: Secondary | ICD-10-CM

## 2019-06-16 DIAGNOSIS — H269 Unspecified cataract: Secondary | ICD-10-CM | POA: Diagnosis present

## 2019-06-16 DIAGNOSIS — Z881 Allergy status to other antibiotic agents status: Secondary | ICD-10-CM

## 2019-06-16 DIAGNOSIS — I10 Essential (primary) hypertension: Secondary | ICD-10-CM | POA: Diagnosis present

## 2019-06-16 DIAGNOSIS — M1611 Unilateral primary osteoarthritis, right hip: Secondary | ICD-10-CM | POA: Diagnosis not present

## 2019-06-16 DIAGNOSIS — Z833 Family history of diabetes mellitus: Secondary | ICD-10-CM

## 2019-06-16 HISTORY — PX: TOTAL HIP ARTHROPLASTY: SHX124

## 2019-06-16 LAB — CREATININE, SERUM
Creatinine, Ser: 0.92 mg/dL (ref 0.44–1.00)
GFR calc Af Amer: >60 mL/min (ref >60)
GFR calc non Af Amer: >60 mL/min (ref >60)

## 2019-06-16 LAB — CBC
HCT: 32.2 % — ABNORMAL LOW (ref 36.0–46.0)
Hemoglobin: 11 g/dL — ABNORMAL LOW (ref 12.0–15.0)
MCH: 28.1 pg (ref 26.0–34.0)
MCHC: 34.2 g/dL (ref 30.0–36.0)
MCV: 82.4 fL (ref 80.0–100.0)
Platelets: 238 10*3/uL (ref 150–400)
RBC: 3.91 MIL/uL (ref 3.87–5.11)
RDW: 15.2 % (ref 11.5–15.5)
WBC: 5.7 10*3/uL (ref 4.0–10.5)
nRBC: 0 % (ref 0.0–0.2)

## 2019-06-16 LAB — ABO/RH: ABO/RH(D): A POS

## 2019-06-16 SURGERY — ARTHROPLASTY, HIP, TOTAL, ANTERIOR APPROACH
Anesthesia: Spinal | Site: Hip | Laterality: Right

## 2019-06-16 MED ORDER — ASPIRIN EC 81 MG PO TBEC
81.0000 mg | DELAYED_RELEASE_TABLET | Freq: Every day | ORAL | Status: DC
  Administered 2019-06-16 – 2019-06-19 (×4): 81 mg via ORAL
  Filled 2019-06-16 (×4): qty 1

## 2019-06-16 MED ORDER — PROPOFOL 10 MG/ML IV BOLUS
INTRAVENOUS | Status: DC | PRN
  Administered 2019-06-16: 20 mg via INTRAVENOUS
  Administered 2019-06-16: 30 mg via INTRAVENOUS

## 2019-06-16 MED ORDER — PHENYLEPHRINE HCL (PRESSORS) 10 MG/ML IV SOLN
INTRAVENOUS | Status: AC
  Filled 2019-06-16: qty 1

## 2019-06-16 MED ORDER — HYDROCODONE-ACETAMINOPHEN 5-325 MG PO TABS: 1.0000 | ORAL_TABLET | ORAL | Status: DC | PRN

## 2019-06-16 MED ORDER — CLINDAMYCIN PHOSPHATE 900 MG/50ML IV SOLN
INTRAVENOUS | Status: AC
  Administered 2019-06-17: 900 mg via INTRAVENOUS
  Filled 2019-06-16: qty 50

## 2019-06-16 MED ORDER — SODIUM CHLORIDE 0.9 % IV SOLN
INTRAVENOUS | Status: DC | PRN
  Administered 2019-06-16: 60 mL

## 2019-06-16 MED ORDER — FENTANYL CITRATE (PF) 100 MCG/2ML IJ SOLN
INTRAMUSCULAR | Status: DC | PRN
  Administered 2019-06-16: 50 ug via INTRAVENOUS

## 2019-06-16 MED ORDER — DOCUSATE SODIUM 100 MG PO CAPS
100.0000 mg | ORAL_CAPSULE | Freq: Two times a day (BID) | ORAL | Status: DC
  Administered 2019-06-16 – 2019-06-19 (×6): 100 mg via ORAL
  Filled 2019-06-16 (×6): qty 1

## 2019-06-16 MED ORDER — MIDAZOLAM HCL 5 MG/5ML IJ SOLN
INTRAMUSCULAR | Status: DC | PRN
  Administered 2019-06-16: 2 mg via INTRAVENOUS

## 2019-06-16 MED ORDER — NEOMYCIN-POLYMYXIN B GU 40-200000 IR SOLN
Status: AC
  Filled 2019-06-16: qty 4

## 2019-06-16 MED ORDER — MAGNESIUM CITRATE PO SOLN: 1.0000 | Freq: Once | ORAL | Status: DC | PRN

## 2019-06-16 MED ORDER — LACTATED RINGERS IV SOLN: INTRAVENOUS | Status: DC

## 2019-06-16 MED ORDER — SODIUM CHLORIDE (PF) 0.9 % IJ SOLN
INTRAMUSCULAR | Status: AC
  Filled 2019-06-16: qty 50

## 2019-06-16 MED ORDER — TRANEXAMIC ACID-NACL 1000-0.7 MG/100ML-% IV SOLN
INTRAVENOUS | Status: AC
  Filled 2019-06-16: qty 100

## 2019-06-16 MED ORDER — DIPHENHYDRAMINE HCL 12.5 MG/5ML PO ELIX: 12.5000 mg | ORAL_SOLUTION | ORAL | Status: DC | PRN

## 2019-06-16 MED ORDER — CLINDAMYCIN PHOSPHATE 900 MG/50ML IV SOLN
900.0000 mg | Freq: Four times a day (QID) | INTRAVENOUS | Status: AC
  Administered 2019-06-16 (×2): 900 mg via INTRAVENOUS
  Filled 2019-06-16 (×3): qty 50

## 2019-06-16 MED ORDER — BUPIVACAINE HCL (PF) 0.25 % IJ SOLN
INTRAMUSCULAR | Status: AC
  Filled 2019-06-16: qty 30

## 2019-06-16 MED ORDER — LIDOCAINE HCL (PF) 2 % IJ SOLN
INTRAMUSCULAR | Status: AC
  Filled 2019-06-16: qty 5

## 2019-06-16 MED ORDER — ONDANSETRON HCL 4 MG PO TABS: 4.0000 mg | ORAL_TABLET | Freq: Four times a day (QID) | ORAL | Status: DC | PRN

## 2019-06-16 MED ORDER — FENTANYL CITRATE (PF) 100 MCG/2ML IJ SOLN
INTRAMUSCULAR | Status: AC
  Filled 2019-06-16: qty 2

## 2019-06-16 MED ORDER — SODIUM CHLORIDE 0.9 % IV SOLN
INTRAVENOUS | Status: DC | PRN
  Administered 2019-06-16: 30 ug/min via INTRAVENOUS

## 2019-06-16 MED ORDER — HYDROCHLOROTHIAZIDE 25 MG PO TABS
25.0000 mg | ORAL_TABLET | Freq: Every day | ORAL | Status: DC
  Administered 2019-06-16 – 2019-06-19 (×4): 25 mg via ORAL
  Filled 2019-06-16 (×4): qty 1

## 2019-06-16 MED ORDER — POLYVINYL ALCOHOL 1.4 % OP SOLN
1.0000 [drp] | Freq: Every day | OPHTHALMIC | Status: DC
  Administered 2019-06-16 – 2019-06-18 (×3): 1 [drp] via OPHTHALMIC
  Filled 2019-06-16: qty 15

## 2019-06-16 MED ORDER — BISACODYL 10 MG RE SUPP
10.0000 mg | Freq: Every day | RECTAL | Status: DC | PRN
  Administered 2019-06-19: 10 mg via RECTAL
  Filled 2019-06-16: qty 1

## 2019-06-16 MED ORDER — BUPIVACAINE LIPOSOME 1.3 % IJ SUSP
INTRAMUSCULAR | Status: AC
  Filled 2019-06-16: qty 20

## 2019-06-16 MED ORDER — METHOCARBAMOL 500 MG PO TABS
500.0000 mg | ORAL_TABLET | Freq: Four times a day (QID) | ORAL | Status: DC | PRN
  Administered 2019-06-17: 500 mg via ORAL
  Filled 2019-06-16: qty 1

## 2019-06-16 MED ORDER — ONDANSETRON HCL 4 MG/2ML IJ SOLN
INTRAMUSCULAR | Status: DC | PRN
  Administered 2019-06-16: 4 mg via INTRAVENOUS

## 2019-06-16 MED ORDER — HYDROCODONE-ACETAMINOPHEN 7.5-325 MG PO TABS
1.0000 | ORAL_TABLET | ORAL | Status: DC | PRN
  Administered 2019-06-16 – 2019-06-19 (×6): 2 via ORAL
  Filled 2019-06-16 (×7): qty 2

## 2019-06-16 MED ORDER — NEOMYCIN-POLYMYXIN B GU 40-200000 IR SOLN
Status: DC | PRN
  Administered 2019-06-16: 4 mL

## 2019-06-16 MED ORDER — ACETAMINOPHEN 500 MG PO TABS
500.0000 mg | ORAL_TABLET | Freq: Four times a day (QID) | ORAL | Status: AC
  Administered 2019-06-16 – 2019-06-17 (×3): 500 mg via ORAL
  Filled 2019-06-16 (×3): qty 1

## 2019-06-16 MED ORDER — PROPOFOL 500 MG/50ML IV EMUL
INTRAVENOUS | Status: DC | PRN
  Administered 2019-06-16: 75 ug/kg/min via INTRAVENOUS

## 2019-06-16 MED ORDER — ACETAMINOPHEN 10 MG/ML IV SOLN
INTRAVENOUS | Status: DC | PRN
  Administered 2019-06-16: 1000 mg via INTRAVENOUS

## 2019-06-16 MED ORDER — ZOLPIDEM TARTRATE 5 MG PO TABS: 5.0000 mg | ORAL_TABLET | Freq: Every evening | ORAL | Status: DC | PRN

## 2019-06-16 MED ORDER — FENTANYL CITRATE (PF) 100 MCG/2ML IJ SOLN: 25.0000 ug | INTRAMUSCULAR | Status: DC | PRN

## 2019-06-16 MED ORDER — ROSUVASTATIN CALCIUM 10 MG PO TABS
10.0000 mg | ORAL_TABLET | Freq: Every day | ORAL | Status: DC
  Administered 2019-06-16 – 2019-06-19 (×4): 10 mg via ORAL
  Filled 2019-06-16 (×4): qty 1

## 2019-06-16 MED ORDER — METOCLOPRAMIDE HCL 5 MG/ML IJ SOLN: 5.0000 mg | Freq: Three times a day (TID) | INTRAMUSCULAR | Status: DC | PRN

## 2019-06-16 MED ORDER — PROPOFOL 500 MG/50ML IV EMUL
INTRAVENOUS | Status: AC
  Filled 2019-06-16: qty 50

## 2019-06-16 MED ORDER — METOCLOPRAMIDE HCL 10 MG PO TABS: 5.0000 mg | ORAL_TABLET | Freq: Three times a day (TID) | ORAL | Status: DC | PRN

## 2019-06-16 MED ORDER — ENOXAPARIN SODIUM 40 MG/0.4ML ~~LOC~~ SOLN
40.0000 mg | SUBCUTANEOUS | Status: DC
  Administered 2019-06-17 – 2019-06-19 (×3): 40 mg via SUBCUTANEOUS
  Filled 2019-06-16 (×3): qty 0.4

## 2019-06-16 MED ORDER — BUPIVACAINE-EPINEPHRINE 0.25% -1:200000 IJ SOLN
INTRAMUSCULAR | Status: DC | PRN
  Administered 2019-06-16: 30 mL

## 2019-06-16 MED ORDER — TRANEXAMIC ACID-NACL 1000-0.7 MG/100ML-% IV SOLN
1000.0000 mg | Freq: Once | INTRAVENOUS | Status: AC
  Administered 2019-06-16: 1000 mg via INTRAVENOUS

## 2019-06-16 MED ORDER — ONDANSETRON HCL 4 MG/2ML IJ SOLN
4.0000 mg | Freq: Four times a day (QID) | INTRAMUSCULAR | Status: DC | PRN
  Administered 2019-06-16 – 2019-06-18 (×2): 4 mg via INTRAVENOUS
  Filled 2019-06-16 (×2): qty 2

## 2019-06-16 MED ORDER — MENTHOL 3 MG MT LOZG: 1.0000 | LOZENGE | OROMUCOSAL | Status: DC | PRN

## 2019-06-16 MED ORDER — OLMESARTAN MEDOXOMIL-HCTZ 40-25 MG PO TABS: 1.0000 | ORAL_TABLET | Freq: Every day | ORAL | Status: DC

## 2019-06-16 MED ORDER — ACETAMINOPHEN 10 MG/ML IV SOLN
INTRAVENOUS | Status: AC
  Filled 2019-06-16: qty 100

## 2019-06-16 MED ORDER — ONDANSETRON HCL 4 MG/2ML IJ SOLN: 4.0000 mg | Freq: Once | INTRAMUSCULAR | Status: DC | PRN

## 2019-06-16 MED ORDER — MORPHINE SULFATE (PF) 2 MG/ML IV SOLN
0.5000 mg | INTRAVENOUS | Status: DC | PRN
  Administered 2019-06-16 (×2): 1 mg via INTRAVENOUS
  Filled 2019-06-16 (×2): qty 1

## 2019-06-16 MED ORDER — MAGNESIUM HYDROXIDE 400 MG/5ML PO SUSP
30.0000 mL | Freq: Every day | ORAL | Status: DC | PRN
  Administered 2019-06-18: 30 mL via ORAL
  Filled 2019-06-16: qty 30

## 2019-06-16 MED ORDER — EPINEPHRINE PF 1 MG/ML IJ SOLN
INTRAMUSCULAR | Status: AC
  Filled 2019-06-16: qty 1

## 2019-06-16 MED ORDER — SODIUM CHLORIDE 0.9 % IV SOLN: INTRAVENOUS | Status: DC

## 2019-06-16 MED ORDER — MIDAZOLAM HCL 2 MG/2ML IJ SOLN
INTRAMUSCULAR | Status: AC
  Filled 2019-06-16: qty 2

## 2019-06-16 MED ORDER — FAMOTIDINE 20 MG PO TABS
ORAL_TABLET | ORAL | Status: AC
  Administered 2019-06-16: 20 mg via ORAL
  Filled 2019-06-16: qty 1

## 2019-06-16 MED ORDER — METHOCARBAMOL 1000 MG/10ML IJ SOLN
500.0000 mg | Freq: Four times a day (QID) | INTRAVENOUS | Status: DC | PRN
  Filled 2019-06-16: qty 5

## 2019-06-16 MED ORDER — FAMOTIDINE 20 MG PO TABS: 20.0000 mg | ORAL_TABLET | Freq: Once | ORAL | Status: AC

## 2019-06-16 MED ORDER — PHENYLEPHRINE HCL (PRESSORS) 10 MG/ML IV SOLN
INTRAVENOUS | Status: DC | PRN
  Administered 2019-06-16 (×2): 200 ug via INTRAVENOUS
  Administered 2019-06-16 (×2): 100 ug via INTRAVENOUS
  Administered 2019-06-16 (×3): 200 ug via INTRAVENOUS

## 2019-06-16 MED ORDER — IRBESARTAN 150 MG PO TABS
300.0000 mg | ORAL_TABLET | Freq: Every day | ORAL | Status: DC
  Administered 2019-06-16 – 2019-06-19 (×4): 300 mg via ORAL
  Filled 2019-06-16 (×4): qty 2

## 2019-06-16 MED ORDER — GLYCOPYRROLATE 0.2 MG/ML IJ SOLN
INTRAMUSCULAR | Status: AC
  Filled 2019-06-16: qty 1

## 2019-06-16 MED ORDER — ACETAMINOPHEN 325 MG PO TABS
325.0000 mg | ORAL_TABLET | Freq: Four times a day (QID) | ORAL | Status: DC | PRN
  Administered 2019-06-17 (×2): 650 mg via ORAL
  Filled 2019-06-16 (×2): qty 2

## 2019-06-16 MED ORDER — PHENOL 1.4 % MT LIQD: 1.0000 | OROMUCOSAL | Status: DC | PRN

## 2019-06-16 SURGICAL SUPPLY — 60 items
BLADE SAGITTAL AGGR TOOTH XLG (BLADE) ×3 IMPLANT
BNDG COHESIVE 6X5 TAN STRL LF (GAUZE/BANDAGES/DRESSINGS) ×9 IMPLANT
CANISTER SUCT 1200ML W/VALVE (MISCELLANEOUS) ×3 IMPLANT
CANISTER WOUND CARE 500ML ATS (WOUND CARE) ×3 IMPLANT
CHLORAPREP W/TINT 26 (MISCELLANEOUS) ×3 IMPLANT
COVER BACK TABLE REUSABLE LG (DRAPES) ×3 IMPLANT
COVER WAND RF STERILE (DRAPES) ×3 IMPLANT
DRAPE 3/4 80X56 (DRAPES) ×9 IMPLANT
DRAPE C-ARM XRAY 36X54 (DRAPES) ×3 IMPLANT
DRAPE INCISE IOBAN 66X60 STRL (DRAPES) IMPLANT
DRAPE POUCH INSTRU U-SHP 10X18 (DRAPES) ×3 IMPLANT
DRESSING SURGICEL FIBRLLR 1X2 (HEMOSTASIS) ×2 IMPLANT
DRSG OPSITE POSTOP 4X8 (GAUZE/BANDAGES/DRESSINGS) ×6 IMPLANT
DRSG SURGICEL FIBRILLAR 1X2 (HEMOSTASIS) ×6
ELECT BLADE 6.5 EXT (BLADE) ×3 IMPLANT
ELECT REM PT RETURN 9FT ADLT (ELECTROSURGICAL) ×3
ELECTRODE REM PT RTRN 9FT ADLT (ELECTROSURGICAL) ×1 IMPLANT
GLOVE BIOGEL PI IND STRL 9 (GLOVE) ×1 IMPLANT
GLOVE BIOGEL PI INDICATOR 9 (GLOVE) ×2
GLOVE SURG SYN 9.0  PF PI (GLOVE) ×4
GLOVE SURG SYN 9.0 PF PI (GLOVE) ×2 IMPLANT
GOWN SRG 2XL LVL 4 RGLN SLV (GOWNS) ×1 IMPLANT
GOWN STRL NON-REIN 2XL LVL4 (GOWNS) ×2
GOWN STRL REUS W/ TWL LRG LVL3 (GOWN DISPOSABLE) ×1 IMPLANT
GOWN STRL REUS W/TWL LRG LVL3 (GOWN DISPOSABLE) ×2
HEMOVAC 400CC 10FR (MISCELLANEOUS) IMPLANT
HIP FEM HD S 28 (Head) ×2 IMPLANT
HOLDER FOLEY CATH W/STRAP (MISCELLANEOUS) ×3 IMPLANT
HOOD PEEL AWAY FLYTE STAYCOOL (MISCELLANEOUS) ×3 IMPLANT
KIT PREVENA INCISION MGT 13 (CANNISTER) ×3 IMPLANT
LINER DBL MOB SZ 0 52MM (Liner) ×2 IMPLANT
MAT ABSORB  FLUID 56X50 GRAY (MISCELLANEOUS) ×2
MAT ABSORB FLUID 56X50 GRAY (MISCELLANEOUS) ×1 IMPLANT
NDL SAFETY ECLIPSE 18X1.5 (NEEDLE) ×1 IMPLANT
NDL SPNL 20GX3.5 QUINCKE YW (NEEDLE) ×2 IMPLANT
NEEDLE HYPO 18GX1.5 SHARP (NEEDLE) ×2
NEEDLE SPNL 20GX3.5 QUINCKE YW (NEEDLE) ×6 IMPLANT
NS IRRIG 1000ML POUR BTL (IV SOLUTION) ×3 IMPLANT
PACK HIP COMPR (MISCELLANEOUS) ×3 IMPLANT
SCALPEL PROTECTED #10 DISP (BLADE) ×6 IMPLANT
SHELL ACETABULAR SZ 52 DM (Shell) ×2 IMPLANT
SOL PREP PVP 2OZ (MISCELLANEOUS) ×3
SOLUTION PREP PVP 2OZ (MISCELLANEOUS) ×1 IMPLANT
SPONGE DRAIN TRACH 4X4 STRL 2S (GAUZE/BANDAGES/DRESSINGS) ×3 IMPLANT
STAPLER SKIN PROX 35W (STAPLE) ×3 IMPLANT
STEM FEMORAL SZ3  STD COLLARED (Stem) ×2 IMPLANT
STRAP SAFETY 5IN WIDE (MISCELLANEOUS) ×1 IMPLANT
SUT DVC 2 QUILL PDO  T11 36X36 (SUTURE) ×2
SUT DVC 2 QUILL PDO T11 36X36 (SUTURE) ×1 IMPLANT
SUT SILK 0 (SUTURE) ×2
SUT SILK 0 30XBRD TIE 6 (SUTURE) ×1 IMPLANT
SUT V-LOC 90 ABS DVC 3-0 CL (SUTURE) ×3 IMPLANT
SUT VIC AB 1 CT1 36 (SUTURE) ×3 IMPLANT
SYR 20ML LL LF (SYRINGE) ×3 IMPLANT
SYR 30ML LL (SYRINGE) ×3 IMPLANT
SYR 50ML LL SCALE MARK (SYRINGE) ×6 IMPLANT
SYR BULB IRRIG 60ML STRL (SYRINGE) ×3 IMPLANT
TAPE MICROFOAM 4IN (TAPE) ×3 IMPLANT
TOWEL OR 17X26 4PK STRL BLUE (TOWEL DISPOSABLE) ×3 IMPLANT
TRAY FOLEY MTR SLVR 16FR STAT (SET/KITS/TRAYS/PACK) ×3 IMPLANT

## 2019-06-16 NOTE — Op Note (Signed)
06/16/2019  9:27 AM  PATIENT:  Lisa Steele  74 y.o. female  PRE-OPERATIVE DIAGNOSIS:  Primary localized osteoarthritis of both hips  POST-OPERATIVE DIAGNOSIS:  Primary localized osteoarthritis of both hips  PROCEDURE:  Procedure(s): TOTAL HIP ARTHROPLASTY ANTERIOR APPROACH (Right)  SURGEON: Leitha Schuller, MD  ASSISTANTS: None  ANESTHESIA:   spinal  EBL:  Total I/O In: 1150 [I.V.:1000; IV Piggyback:150] Out: 1300 [Urine:300; Blood:1000]  BLOOD ADMINISTERED:none  DRAINS: Incisional wound VAC applied   LOCAL MEDICATIONS USED:  MARCAINE    and OTHER Exparel  SPECIMEN:  Source of Specimen:  Right femoral head  DISPOSITION OF SPECIMEN:  PATHOLOGY  COUNTS:  YES  TOURNIQUET:  * No tourniquets in log *  IMPLANTS: Medacta  AMIS 3 standard stem with 52 mm Mpact TM cup and liner with metal S 28 mm head  DICTATION: .Dragon Dictation   The patient was brought to the operating room and after spinal anesthesia was obtained patient was placed on the operative table with the ipsilateral foot into the Medacta attachment, contralateral leg on a well-padded table. C-arm was brought in and preop template x-ray taken. After prepping and draping in usual sterile fashion appropriate patient identification and timeout procedures were completed. Anterior approach to the hip was obtained and centered over the greater trochanter and TFL muscle. The subcutaneous tissue was incised hemostasis being achieved by electrocautery. TFL fascia was incised and the muscle retracted laterally deep retractor placed. The lateral femoral circumflex vessels were identified and ligated. The anterior capsule was exposed and a capsulotomy performed. The neck was identified and a femoral neck cut carried out with a saw. The head was removed without difficulty and showed sclerotic femoral head and acetabulum. Reaming was carried out to 50 mm and a 52 mm cup trial gave appropriate tightness to the acetabular component a 52 DM  cup was impacted into position. The leg was then externally rotated and ischiofemoral and pubofemoral releases carried out. The femur was sequentially broached to a size 3, size 3 standard with S head trials were placed and the final components chosen. The 3 standard stem was inserted along with a metal S 28 mm head and 52 mm liner. The hip was reduced and was stable the wound was thoroughly irrigated with fibrillar placed along the posterior capsule and medial neck. The deep fascia ws closed using a heavy Quill after infiltration of dilute Exparel with 30 cc of quarter percent Sensorcaine with epinephrine.3-0 V-loc to close the skin with skin staples.  Incisional wound VAC applied and patient was sent to recovery in stable condition.   PLAN OF CARE: Admit for overnight observation

## 2019-06-16 NOTE — Anesthesia Preprocedure Evaluation (Signed)
Anesthesia Evaluation  Patient identified by MRN, date of birth, ID band Patient awake    Reviewed: Allergy & Precautions, NPO status , Patient's Chart, lab work & pertinent test results  History of Anesthesia Complications Negative for: history of anesthetic complications  Airway Mallampati: II       Dental   Pulmonary neg sleep apnea, neg COPD, Not current smoker, former smoker,           Cardiovascular hypertension, Pt. on medications (-) Past MI and (-) CHF (-) dysrhythmias (-) Valvular Problems/Murmurs     Neuro/Psych neg Seizures    GI/Hepatic Neg liver ROS, neg GERD  ,  Endo/Other  neg diabetes  Renal/GU negative Renal ROS     Musculoskeletal   Abdominal   Peds  Hematology   Anesthesia Other Findings   Reproductive/Obstetrics                             Anesthesia Physical Anesthesia Plan  ASA: II  Anesthesia Plan: Spinal   Post-op Pain Management:    Induction:   PONV Risk Score and Plan:   Airway Management Planned:   Additional Equipment:   Intra-op Plan:   Post-operative Plan:   Informed Consent: I have reviewed the patients History and Physical, chart, labs and discussed the procedure including the risks, benefits and alternatives for the proposed anesthesia with the patient or authorized representative who has indicated his/her understanding and acceptance.       Plan Discussed with:   Anesthesia Plan Comments:         Anesthesia Quick Evaluation

## 2019-06-16 NOTE — Transfer of Care (Signed)
Immediate Anesthesia Transfer of Care Note  Patient: Bethanie Bloxom  Procedure(s) Performed: TOTAL HIP ARTHROPLASTY ANTERIOR APPROACH (Right Hip)  Patient Location: PACU  Anesthesia Type:Spinal  Level of Consciousness: awake and alert   Airway & Oxygen Therapy: Patient Spontanous Breathing and Patient connected to face mask oxygen  Post-op Assessment: Report given to RN and Post -op Vital signs reviewed and stable  Post vital signs: Reviewed and stable  Last Vitals:  Vitals Value Taken Time  BP 78/43 06/16/19 0929  Temp    Pulse 56 06/16/19 0929  Resp 19 06/16/19 0929  SpO2 100 % 06/16/19 0929    Last Pain:  Vitals:   06/16/19 0922  TempSrc:   PainSc: (P) 0-No pain         Complications: No apparent anesthesia complications

## 2019-06-16 NOTE — Progress Notes (Signed)
PT Cancellation Note  Patient Details Name: Lisa Steele MRN: [id number] DOB: [birth year]   Cancelled Treatment:    Reason Eval/Treat Not Completed: Medical issues which prohibited therapy(Chart reviewed, evaluation attempted. Pt in bed awake and tearful, guest and RN at bedside. RN reports pain control difficulty and emesis.) Will attempt evaluation again at later date/time when appropriate.   3:42 PM, 06/16/19 Rosamaria Lints, PT, DPT Physical Therapist - [facility]  [phone] (ASCOM)    [name] 06/16/2019, 3:42 PM

## 2019-06-16 NOTE — H&P (Signed)
Chief Complaint  Patient presents with  . Establish Care  Rt Knee OA   History of the Present Illness: Lisa Steele is a 74 y.o. female here for evaluation of bilateral knee osteoarthritis, right worse than left. I have seen the patient in the past for this issue. She received a Monovisc injection from [place], [place] that did not give relief. She comes in today to discuss possible right total knee arthroplasty.   The patient has been fairly inactive since she started having right knee pain. She also has bilateral hip arthritis, right worse than left.  She had right knee x-rays in 2018 that showed severe medial compartment and patellofemoral degenerative change with marked varus deformity. This was present on bilateral knees.   The patient states she had a right hip MRI 2-1/2 years ago. She has taken meloxicam for her right hip osteoarthritis. She has pain to the groin area that radiates down her right leg.   She also has left foot pain. She has never had a DVT.  She has had her COVID injections.   I have reviewed past medical, surgical, social and family history, and allergies as documented in the EMR.  Past Medical History: Past Medical History:  Diagnosis Date  . Abnormal EKG  . Chickenpox  . DJD (degenerative joint disease)  . Hypercholesterolemia  . Hypertension  . Obesity  . Traumatic rotator cuff tear   Past Surgical History: Past Surgical History:  Procedure Laterality Date  . ARTHROSCOPIC ROTATOR CUFF REPAIR Left 11/25/2017  . Bunion left foot 2008  . CATARACT SURGERY 10/10/13, 10/24/13  . COLONOSCOPY 03/14/2011, 09/17/2007, 07/28/2003  PH Colon Polyps [facility]): CBF 03/2016; Recall Ltr mailed 01/21/2016 (dw)  . COLONOSCOPY 06/25/2016  PH Colon Polyps: CBF 06/2021  . Ectopic pregnancy 1978  . Lumpectomy left breast 1972  benign  . TONSILLECTOMY  . TUBAL LIGATION   Past Family History: Family History  Problem Relation Age of Onset  .  Diabetes Mother  . Stroke Mother  . Myocardial Infarction (Heart attack) Mother  . Aneurysm Father  . Diabetes Sister  . High blood pressure (Hypertension) Other   Medications: Current Outpatient Medications Ordered in Epic  Medication Sig Dispense Refill  . aspirin 81 MG EC tablet Take 81 mg by mouth once daily.  Marland Kitchen olmesartan-hydrochlorothiazide (BENICAR HCT) 40-25 mg tablet Take by mouth Take 1 tablet by mouth daily.  . rosuvastatin (CRESTOR) 10 MG tablet Take 10 mg by mouth nightly.   No current Epic-ordered facility-administered medications on file.   Allergies: Allergies  Allergen Reactions  . Tramadol Nausea and Nausea And Vomiting  . Doxycycline Itching  . Penicillins Hives and Other (See Comments)  leg lesions and weakness (couldn't walk)    Body mass index is 36.43 kg/m.  Review of Systems: A comprehensive 14 point ROS was performed, reviewed, and the pertinent orthopaedic findings are documented in the HPI.  Vitals:  05/27/19 0843  BP: 134/70   General Physical Examination:  General/Constitutional: No apparent distress: well-nourished and well developed. Eyes: Pupils equal, round with synchronous movement. Lungs: Clear to auscultation HEENT: Normal Vascular: No edema, swelling or tenderness, except as noted in detailed exam. Cardiac: Heart rate and rhythm is regular. Integumentary: No impressive skin lesions present, except as noted in detailed exam. Neuro/Psych: Normal mood and affect, oriented to person, place and time.  Musculoskeletal Examination: On exam, right knee lacking 5 degrees of extension, stiffness with flexion. Right hip with internal rotation of 0 degrees, external  to 50 degrees. Bilateral hip stiffness, right worse than left. Right hip with foot pushed out has external rotation to 35-40 degrees, with foot pushed in she has knee pain. She has to externally rotate in extreme flexion with severe pain. Lungs are clear. Heart rate and rhythm is  normal. HEENT is normal with dental crowns present.  Radiographs: AP pelvis and lateral x-rays of the right hip were ordered and personally reviewed today. These show bilateral significant central hip osteoarthritis, large osteophyte inferiorly. Lateral view shows osteophytes on the femoral head with significant erosion centrally, incidental finding of uterine fibroid.  X-ray Impression  Advanced bilateral hip osteoarthritis, right much worse than left.  Assessment: ICD-10-CM  1. Primary osteoarthritis of right knee M17.11  2. 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   We discussed the patient's x-ray findings. I advised the patient that her right knee pain is radiating from her right hip. I recommend a right hip arthroplasty, and explained the surgery and postoperative course in detail. I gave her the hip arthroplasty brochure. She may require a total knee arthroplasty following this surgery if her right knee pain persists.  We will schedule the patient for right hip arthroplasty in the near future.   Surgical Risks:  The nature of the condition and the proposed procedure has been reviewed in detail with the patient. Surgical versus non-surgical options and prognosis for recovery have been reviewed and the inherent risks and benefits of each have been discussed including the risks of infection, bleeding, injury to nerves/blood vessels/tendons, incomplete relief of symptoms, persisting pain and/or stiffness, loss of function, complex regional pain syndrome, failure of the procedure, as appropriate.  Teeth: Crowns present.  Scribe Attestation: I, Dawn Royse, am acting as scribe for [employer], MD     Reviewed paper H+P, will be scanned into chart. No changes noted.

## 2019-06-16 NOTE — Anesthesia Procedure Notes (Signed)
Spinal  Patient location during procedure: OR Start time: 06/16/2019 7:25 AM End time: 06/16/2019 7:35 AM Staffing Performed: resident/CRNA and anesthesiologist  Anesthesiologist: Gunnar Fusi, MD Resident/CRNA: Johnna Acosta, CRNA Preanesthetic Checklist Completed: patient identified, IV checked, site marked, risks and benefits discussed, surgical consent, monitors and equipment checked, pre-op evaluation and timeout performed Spinal Block Patient position: sitting Prep: ChloraPrep Patient monitoring: heart rate, continuous pulse ox, blood pressure and cardiac monitor Approach: midline Location: L3-4 Injection technique: single-shot Needle Needle type: Whitacre and Introducer  Needle gauge: 24 G Needle length: 9 cm Assessment Sensory level: T10 Additional Notes Negative paresthesia. Negative blood return. Positive free-flowing CSF. Expiration date of kit checked and confirmed. Patient tolerated procedure well, without complications.

## 2019-06-17 LAB — CBC
HCT: 34.7 % — ABNORMAL LOW (ref 36.0–46.0)
Hemoglobin: 11.5 g/dL — ABNORMAL LOW (ref 12.0–15.0)
MCH: 28 pg (ref 26.0–34.0)
MCHC: 33.1 g/dL (ref 30.0–36.0)
MCV: 84.6 fL (ref 80.0–100.0)
Platelets: 250 10*3/uL (ref 150–400)
RBC: 4.1 MIL/uL (ref 3.87–5.11)
RDW: 15.1 % (ref 11.5–15.5)
WBC: 9.9 10*3/uL (ref 4.0–10.5)
nRBC: 0 % (ref 0.0–0.2)

## 2019-06-17 LAB — BASIC METABOLIC PANEL
Anion gap: 9 (ref 5–15)
BUN: 12 mg/dL (ref 8–23)
CO2: 26 mmol/L (ref 22–32)
Calcium: 8.5 mg/dL — ABNORMAL LOW (ref 8.9–10.3)
Chloride: 103 mmol/L (ref 98–111)
Creatinine, Ser: 0.81 mg/dL (ref 0.44–1.00)
GFR calc Af Amer: >60 mL/min (ref >60)
GFR calc non Af Amer: >60 mL/min (ref >60)
Glucose, Bld: 118 mg/dL — ABNORMAL HIGH (ref 70–99)
Potassium: 3.8 mmol/L (ref 3.5–5.1)
Sodium: 138 mmol/L (ref 135–145)

## 2019-06-17 MED ORDER — ENOXAPARIN SODIUM 40 MG/0.4ML ~~LOC~~ SOLN: 40.0000 mg | SUBCUTANEOUS | 0 refills | Status: DC

## 2019-06-17 MED ORDER — MAGNESIUM HYDROXIDE 400 MG/5ML PO SUSP
30.0000 mL | Freq: Once | ORAL | Status: AC
  Administered 2019-06-17: 30 mL via ORAL
  Filled 2019-06-17: qty 30

## 2019-06-17 MED ORDER — HYDROCODONE-ACETAMINOPHEN 5-325 MG PO TABS: 1.0000 | ORAL_TABLET | Freq: Four times a day (QID) | ORAL | 0 refills | Status: DC | PRN

## 2019-06-17 MED ORDER — DOCUSATE SODIUM 100 MG PO CAPS: 100.0000 mg | ORAL_CAPSULE | Freq: Two times a day (BID) | ORAL | 0 refills | Status: DC

## 2019-06-17 NOTE — TOC Progression Note (Signed)
Transition of Care [facility]) - Progression Note    Patient Details  Name: Lisa Steele MRN: [id number] Date of Birth: 09-16-1945  Transition of Care [facility]) CM/SW Contact  Barrie Dunker, RN Phone Number: 06/17/2019, 11:00 AM  Clinical Narrative:    Requested the price of Lovenox, Will provide once obtained, PT eval pending to determine recommendation        Expected Discharge Plan and Services                                                 Social Determinants of Health (SDOH) Interventions    Readmission Risk Interventions No flowsheet data found.

## 2019-06-17 NOTE — Discharge Summary (Addendum)
Physician Discharge Summary  Patient ID: Lisa Steele MRN: [id number] DOB/AGE: Jul 23, 1945 74 y.o.  Admit date: 06/16/2019 Discharge date: 06/19/19 Admission Diagnoses:  Status post total hip replacement, right [Z96.641] Primary localized osteoarthritis [M19.91]   Discharge Diagnoses: Patient Active Problem List   Diagnosis Date Noted  . Primary localized osteoarthritis 06/18/2019  . Status post total hip replacement, right 06/16/2019  . Trigger finger of left thumb 03/04/2018  . Left hand pain 02/03/2018  . S/P rotator cuff repair 01/12/2018  . Injury of left rotator cuff 11/03/2017  . Obesity (BMI 30.0-34.9) 02/19/2017  . Primary osteoarthritis of both knees 06/22/2014  . Obesity (BMI 30-39.9) 06/22/2014  . Dysmetabolic syndrome 82/99/3716  . Post-menopausal 06/22/2014  . H/O cold sores 06/22/2014  . Dyslipidemia 06/22/2014  . Elevated fasting blood sugar 06/22/2014  . Benign essential HTN 06/22/2014  . Abnormal ECG 06/22/2014    Past Medical History:  Diagnosis Date  . Cataracts, bilateral   . Hyperlipidemia   . Hypertension   . Joint pain    both knees     Transfusion: none   Consultants (if any):   Discharged Condition: Improved  Hospital Course: Lisa Steele is an 74 y.o. female who was admitted 06/16/2019 with a diagnosis of right hip osteoarthritis and went to the operating room on 06/16/2019 and underwent the above named procedures.    Surgeries: Procedure(s): TOTAL HIP ARTHROPLASTY ANTERIOR APPROACH on 06/16/2019 Patient tolerated the surgery well. Taken to PACU where she was stabilized and then transferred to the orthopedic floor.  Started on Lovenox 40 mg q 12 hrs. SCDs applied bilaterally . No evidence of DVT. Negative Homan. Physical therapy started on day #1 for gait training and transfer. OT started day #1 for ADL and assisted devices.  Patient's foley was d/c on day #1. Patient's IV was d/c on day #1.    Implants: Medacta  AMIS 3 standard stem  with 52 mm Mpact TM cup and liner with metal S 28 mm head  She was given perioperative antibiotics:  Anti-infectives (From admission, onward)   Start     Dose/Rate Route Frequency Ordered Stop   06/16/19 1400  clindamycin (CLEOCIN) IVPB 900 mg     900 mg 100 mL/hr over 30 Minutes Intravenous Every 6 hours 06/16/19 1131 06/17/19 0639   06/16/19 0624  clindamycin (CLEOCIN) 900 MG/50ML IVPB    Note to Pharmacy: Lyman Bishop   : cabinet override      06/16/19 0624 06/17/19 0639   06/16/19 0600  clindamycin (CLEOCIN) IVPB 900 mg     900 mg 100 mL/hr over 30 Minutes Intravenous On call to O.R. 06/15/19 2149 06/16/19 9678    .  She was given sequential compression devices, early ambulation, and Lovenox TEDs for DVT prophylaxis.  She benefited maximally from the hospital stay and there were no complications.    Recent vital signs:  Vitals:   06/19/19 0013 06/19/19 0820  BP: (!) 108/53 (!) 130/52  Pulse: 69 72  Resp: 16 20  Temp: 99.1 F (37.3 C) 99.4 F (37.4 C)  SpO2: 96% 94%    Recent laboratory studies:  Lab Results  Component Value Date   HGB 9.4 (L) 06/19/2019   HGB 9.9 (L) 06/18/2019   HGB 11.5 (L) 06/17/2019   Lab Results  Component Value Date   WBC 10.9 (H) 06/19/2019   PLT 238 06/19/2019   No results found for: INR Lab Results  Component Value Date   NA 138 06/17/2019   K  3.8 06/17/2019   CL 103 06/17/2019   CO2 26 06/17/2019   BUN 12 06/17/2019   CREATININE 0.81 06/17/2019   GLUCOSE 118 (H) 06/17/2019    Discharge Medications:   Allergies as of 06/19/2019      Reactions   Tramadol Nausea And Vomiting   Doxycycline Itching   Penicillins Hives, Rash   leg lesions and weakness (couldn't walk) Did it involve swelling of the face/tongue/throat, SOB, or low BP? No Did it involve sudden or severe rash/hives, skin peeling, or any reaction on the inside of your mouth or nose? No Did you need to seek medical attention at a hospital or doctor's office?  Yes When did it last happen?38 years If all above answers are "NO", may proceed with cephalosporin use.      Medication List    TAKE these medications   aspirin EC 325 MG tablet Take 1 tablet (325 mg total) by mouth daily. What changed:   medication strength  how much to take   docusate sodium 100 MG capsule Commonly known as: COLACE Take 1 capsule (100 mg total) by mouth 2 (two) times daily.   HYDROcodone-acetaminophen 5-325 MG tablet Commonly known as: NORCO/VICODIN Take 1-2 tablets by mouth every 6 (six) hours as needed for moderate pain (pain score 4-6).   LORazepam 1 MG tablet Commonly known as: ATIVAN Take 1 tablet by mouth 45 minutes before procedure, then another at time of procedure   olmesartan-hydrochlorothiazide 40-25 MG tablet Commonly known as: BENICAR HCT Take 1 tablet by mouth daily.   rosuvastatin 10 MG tablet Commonly known as: CRESTOR TAKE 1 TABLET BY MOUTH  DAILY   Systane 0.4-0.3 % Soln Generic drug: Polyethyl Glycol-Propyl Glycol Place 1 drop into both eyes at bedtime.            Durable Medical Equipment  (From admission, onward)         Start     Ordered   06/16/19 1132  DME Walker rolling  Once    Question Answer Comment  Walker: With 5 Inch Wheels   Patient needs a walker to treat with the following condition Status post total hip replacement, right      06/16/19 1131   06/16/19 1132  DME 3 n 1  Once     06/16/19 1131   06/16/19 1132  DME Bedside commode  Once    Question:  Patient needs a bedside commode to treat with the following condition  Answer:  Status post total hip replacement, right   06/16/19 1131          Diagnostic Studies: DG HIP OPERATIVE UNILAT W OR W/O PELVIS RIGHT  Result Date: 06/16/2019 CLINICAL DATA:  Right hip replacement EXAM: OPERATIVE RIGHT HIP (WITH PELVIS IF PERFORMED) 2 VIEWS TECHNIQUE: Fluoroscopic spot image(s) were submitted for interpretation post-operatively. COMPARISON:  None.  FINDINGS: Changes of right hip replacement. Normal AP alignment. No visible complicating feature. IMPRESSION: Right hip replacement.  No visible complicating feature. Electronically Signed   By: Charlett Nose M.D.   On: 06/16/2019 09:16   DG HIP UNILAT W OR W/O PELVIS 2-3 VIEWS RIGHT  Result Date: 06/16/2019 CLINICAL DATA:  Total hip replacement. EXAM: DG HIP (WITH OR WITHOUT PELVIS) 2-3V RIGHT COMPARISON:  Intraoperative hip images of 06/16/2019 FINDINGS: Surgical drain undo that wound VAC projects over the site. Small amount of gas in the soft tissues lateral to the right proximal femur and medial to the right proximal femur likely related to recent postoperative  change. Findings of right hip arthroplasty without signs of complication. IMPRESSION: Right hip arthroplasty without signs of complication. Electronically Signed   By: Donzetta Kohut M.D.   On: 06/16/2019 09:58    Disposition: home with home health PT     Follow-up Information    Evon Slack, PA-C Follow up in 2 week(s).   Specialties: Orthopedic Surgery, Emergency Medicine Contact information: [address] [place] [address] [phone]            Signed: Madelyn Flavors PA-C  06/19/2019, 12:03 PM

## 2019-06-17 NOTE — TOC Initial Note (Signed)
Transition of Care [facility]) - Initial/Assessment Note    Patient Details  Name: Lisa Steele MRN: [id number] Date of Birth: 06/16/1945  Transition of Care [facility]) CM/SW Contact:    Su Hilt, RN Phone Number: 06/17/2019, 12:57 PM  Clinical Narrative:                 Met with the patient and her husband at the bedside She plans to go home with [facility] is set up prior to surgery, she will need a rolling walker and 3 in 1, I notified Brad with [employer] I explained that I requested the price of lovenox, she stated that she would go home on PO medication to prevent blood clots Her husband provides transportation and she is up to date with PCP and she can afford her medicaitons  Expected Discharge Plan: [place] Barriers to Discharge: Continued Medical Work up   Patient Goals and CMS Choice Patient states their goals for this hospitalization and ongoing recovery are:: go home      Expected Discharge Plan and Services Expected Discharge Plan: [place]   Discharge Planning Services: CM Consult   Living arrangements for the past 2 months: Single Family Home                 DME Arranged: Walker rolling, 3-N-1 DME Agency: [employer] Date DME Agency Contacted: 06/17/19 Time DME Agency Contacted: 65 Representative spoke with at DME Agency: Brad [place]: PT, OT [place] Agency: [facility] (formerly [employer]) Date [place]: 06/17/19 Time [place]: 19 Representative spoke with at [place]: [place] Arrangements/Services Living arrangements for the past 2 months: [place] Lives with:: Spouse Patient language and need for interpreter reviewed:: Yes Do you feel safe going back to the place where you live?: Yes      Need for Family Participation in Patient Care: No (Comment) Care giver support system in place?: Yes (comment)   Criminal Activity/Legal Involvement Pertinent to  Current Situation/Hospitalization: No - Comment as needed  Activities of Daily Living Home Assistive Devices/Equipment: Cane (specify quad or straight) ADL Screening (condition at time of admission) Patient's cognitive ability adequate to safely complete daily activities?: Yes Is the patient deaf or have difficulty hearing?: No Does the patient have difficulty seeing, even when wearing glasses/contacts?: No Does the patient have difficulty concentrating, remembering, or making decisions?: No Patient able to express need for assistance with ADLs?: Yes Does the patient have difficulty dressing or bathing?: No Independently performs ADLs?: Yes (appropriate for developmental age) Does the patient have difficulty walking or climbing stairs?: Yes Weakness of Legs: Right Weakness of Arms/Hands: None  Permission Sought/Granted   Permission granted to share information with : Yes, Verbal Permission Granted              Emotional Assessment Appearance:: Appears stated age Attitude/Demeanor/Rapport: Engaged Affect (typically observed): Appropriate Orientation: : Oriented to  Time, Oriented to Situation, Oriented to Place, Oriented to Self Alcohol / Substance Use: Not Applicable Psych Involvement: No (comment)  Admission diagnosis:  Status post total hip replacement, right [J47.829] Patient Active Problem List   Diagnosis Date Noted  . Status post total hip replacement, right 06/16/2019  . Trigger finger of left thumb 03/04/2018  . Left hand pain 02/03/2018  . S/P rotator cuff repair 01/12/2018  . Injury of left rotator cuff 11/03/2017  . Obesity (BMI 30.0-34.9) 02/19/2017  . Primary osteoarthritis of  both knees 06/22/2014  . Obesity (BMI 30-39.9) 06/22/2014  . Dysmetabolic syndrome 34/37/3578  . Post-menopausal 06/22/2014  . H/O cold sores 06/22/2014  . Dyslipidemia 06/22/2014  . Elevated fasting blood sugar 06/22/2014  . Benign essential HTN 06/22/2014  . Abnormal ECG  06/22/2014   PCP:  Steele Sizer, MD Pharmacy:   [facility] [phone] - [address], [address] AT [address] [place] [address] Phone: [phone] Fax: [phone]     Social Determinants of Health (SDOH) Interventions    Readmission Risk Interventions No flowsheet data found.

## 2019-06-17 NOTE — Discharge Instructions (Signed)

## 2019-06-17 NOTE — Anesthesia Postprocedure Evaluation (Signed)
Anesthesia Post Note  Patient: Lisa Steele  Procedure(s) Performed: TOTAL HIP ARTHROPLASTY ANTERIOR APPROACH (Right Hip)  Patient location during evaluation: Nursing Unit Anesthesia Type: Spinal Level of consciousness: awake and alert, awake and oriented Pain management: pain level controlled Vital Signs Assessment: post-procedure vital signs reviewed and stable Respiratory status: spontaneous breathing, nonlabored ventilation and respiratory function stable Cardiovascular status: blood pressure returned to baseline and stable Postop Assessment: no headache and no backache Anesthetic complications: no     Last Vitals:  Vitals:   06/17/19 0010 06/17/19 0454  BP: (!) 109/46 (!) 131/48  Pulse: 79 73  Resp: 18 18  Temp: 37 C 37.1 C  SpO2: 97% 96%    Last Pain:  Vitals:   06/17/19 0454  TempSrc: Oral  PainSc:                  Ginger Carne

## 2019-06-17 NOTE — Progress Notes (Signed)
   Subjective: 1 Day Post-Op Procedure(s) (LRB): TOTAL HIP ARTHROPLASTY ANTERIOR APPROACH (Right) Patient reports pain as mild.   Patient is well, and has had no acute complaints or problems Denies any CP, SOB, ABD pain. We will continue therapy today.  Plan is to go Home after hospital stay.  Objective: Vital signs in last 24 hours: Temp:  [97 F (36.1 C)-99 F (37.2 C)] 98.8 F (37.1 C) (04/16 0454) Pulse Rate:  [46-79] 73 (04/16 0454) Resp:  [12-19] 18 (04/16 0454) BP: (77-131)/(43-82) 131/48 (04/16 0454) SpO2:  [96 %-100 %] 96 % (04/16 0454)  Intake/Output from previous day: 04/15 0701 - 04/16 0700 In: 2146.3 [I.V.:1946.3; IV Piggyback:200] Out: 1340 [Urine:340; Blood:1000] Intake/Output this shift: No intake/output data recorded.  Recent Labs    06/16/19 0950 06/17/19 0502  HGB 11.0* 11.5*   Recent Labs    06/16/19 0950 06/17/19 0502  WBC 5.7 9.9  RBC 3.91 4.10  HCT 32.2* 34.7*  PLT 238 250   Recent Labs    06/16/19 0950 06/17/19 0502  NA  --  138  K  --  3.8  CL  --  103  CO2  --  26  BUN  --  12  CREATININE 0.92 0.81  GLUCOSE  --  118*  CALCIUM  --  8.5*   No results for input(s): LABPT, INR in the last 72 hours.  EXAM General - Patient is Alert, Appropriate and Oriented Extremity - Neurovascular intact Sensation intact distally Intact pulses distally Dorsiflexion/Plantar flexion intact No cellulitis present Compartment soft Dressing - dressing C/D/I and no drainage Praveena intact without drainage Motor Function - intact, moving foot and toes well on exam.   Past Medical History:  Diagnosis Date  . Cataracts, bilateral   . Hyperlipidemia   . Hypertension   . Joint pain    both knees    Assessment/Plan:   1 Day Post-Op Procedure(s) (LRB): TOTAL HIP ARTHROPLASTY ANTERIOR APPROACH (Right) Active Problems:   Status post total hip replacement, right  Estimated body mass index is 32.8 kg/m as calculated from the following:  Height as of this encounter: 5\' 5"  (1.651 m).   Weight as of this encounter: 89.4 kg. Advance diet Up with therapy   Needs bowel movement Labs and vital signs stable. Pain well controlled  Care manager to assist with discharge  DVT Prophylaxis - Lovenox, TED hose and SCDs Weight-Bearing as tolerated to right leg   T. , PA-C [facility] 06/17/2019, 8:04 AM

## 2019-06-17 NOTE — Progress Notes (Signed)
Physical Therapy Treatment Patient Details Name: Lisa Steele MRN: [id number] DOB: [birth year] Today's Date: 06/17/2019    History of Present Illness Lisa Steele is a 41yoF who comes to [facility] on 06/16/19 for elective Rt THA. Pt underwent direct anterior approach with Dr. Rudene Christians, now [facility]. POD0 evaluation deferred due to PONV/pain control. 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 Pt was AMB limited community distances with [facility], lives with husband, both are retired.    PT Comments    Pt in recliner at entry, husband in room. Pt reports nausea gone, got meds. 2/10 pain in leg with movement, none at rest, 4/10 after transfers and exercise. Reviewed most of HEP handout with patient and husband, husband provided any needed minA for AA/ROM. Pt performing transfers at supervision level, minA for RLE into bed. Pt progressing well. Will assess orthostatic tolerance next date and progress AMB/stairs training as appropriate.   Follow Up Recommendations  Supervision for mobility/OOB;Home health PT     Equipment Recommendations  Rolling walker with 5" wheels;3in1 (PT)    Recommendations for Other Services       Precautions / Restrictions Precautions Precautions: Fall Precaution Comments: orthostatic intolerance POD 0 & 1 Restrictions Weight Bearing Restrictions: No    Mobility  Bed Mobility Overal bed mobility: Needs Assistance Bed Mobility: Sit to Supine     Supine to sit: Mod assist Sit to supine: Min assist   General bed mobility comments: minA of RLE  Transfers Overall transfer level: Needs assistance Equipment used: Rolling walker (2 wheeled) Transfers: Sit to/from [facility] Sit to Stand: Supervision Stand pivot transfers: Supervision       General transfer comment: moving more quickly, more confidently to minimize prolonged standing.  Ambulation/Gait   Gait Distance (Feet): (deferred)             Stairs             Wheelchair Mobility     Modified Rankin (Stroke Patients Only)       Balance Overall balance assessment: Modified Independent;Mild deficits observed, not formally tested                                          Cognition Arousal/Alertness: Awake/alert Behavior During Therapy: WFL for tasks assessed/performed Overall Cognitive Status: Within Functional Limits for tasks assessed                                        Exercises Total Joint Exercises Ankle Circles/Pumps: AROM;Both;15 reps;Supine Quad Sets: AROM;Right;10 reps;Supine Gluteal Sets: AROM;Both;10 reps;Supine Towel Squeeze: Strengthening;Both;10 reps;Supine Short Arc Quad: Right;AROM;10 reps;Supine Heel Slides: AAROM;Right;Supine;10 reps;Limitations Heel Slides Limitations: minA/CGA provided by husband Hip ABduction/ADduction: AAROM;Right;10 reps;Supine;AROM;Limitations Hip Abduction/Adduction Limitations: minA/CGA provided by husband Long Arc Quad: AROM;Both;10 reps;Supine    General Comments        Pertinent Vitals/Pain Pain Assessment: 0-10 Pain Score: 4  Pain Location: operative limb (thigh) Pain Descriptors / Indicators: Aching Pain Intervention(s): Limited activity within patient's tolerance;Monitored during session;Premedicated before session    Home Living Family/patient expects to be discharged to:: Private residence Living Arrangements: Spouse/significant other Available Help at Discharge: Family;Available 24 hours/day(several sisters live locally, plan to assist) Type of Home: House Home Access: Stairs to enter Entrance Stairs-Rails: None Home Layout: One level Home Equipment: Kasandra Knudsen -  single point      Prior Function Level of Independence: Independent with assistive device(s)      Comments: limited community distance AMB   PT Goals (current goals can now be found in the care plan section) Acute Rehab PT Goals Patient Stated Goal: be able to AMB in home PT Goal Formulation: With  patient Time For Goal Achievement: 07/01/19 Potential to Achieve Goals: Good Progress towards PT goals: Progressing toward goals    Frequency    BID      PT Plan Current plan remains appropriate    Co-evaluation              AM-PAC PT "6 Clicks" Mobility   Outcome Measure  Help needed turning from your back to your side while in a flat bed without using bedrails?: A Little Help needed moving from lying on your back to sitting on the side of a flat bed without using bedrails?: A Little Help needed moving to and from a bed to a chair (including a wheelchair)?: A Little Help needed standing up from a chair using your arms (e.g., wheelchair or bedside chair)?: A Little Help needed to walk in hospital room?: A Little Help needed climbing 3-5 steps with a railing? : A Little 6 Click Score: 18    End of Session Equipment Utilized During Treatment: Gait belt Activity Tolerance: Patient tolerated treatment well;No increased pain;Treatment limited secondary to medical complications (Comment) Patient left: with family/visitor present;with chair alarm set;with SCD's reapplied;in bed Nurse Communication: Mobility status PT Visit Diagnosis: Difficulty in walking, not elsewhere classified (R26.2);Other abnormalities of gait and mobility (R26.89);Muscle weakness (generalized) (M62.81);Dizziness and giddiness (R42)     Time: 7062-3762 PT Time Calculation (min) (ACUTE ONLY): 27 min  Charges:  $Therapeutic Exercise: 23-37 mins                     2:15 PM, 06/17/19 Rosamaria Lints, PT, DPT Physical Therapist - [facility]  [phone] (ASCOM)    [name] C 06/17/2019, 2:13 PM

## 2019-06-17 NOTE — Evaluation (Signed)
Occupational Therapy Evaluation Patient Details Name: Lisa Steele MRN: [id number] DOB: [birth year] Today's Date: 06/17/2019    History of Present Illness Maryelizabeth Eberle is a 74yoF who comes to [facility] on 06/16/19 for elective Rt THA. Pt underwent direct anterior approach with Dr. Rosita Kea, now [facility]. POD0 evaluation deferred due to PONV/pain control. 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 Pt was AMB limited community distances with [facility], lives with husband, both are retired.   Clinical Impression   Pt seen for OT evaluation this date, POD#1 from above surgery. Pt was independent in all ADL prior to surgery (some difficulty with R LE bathing and dressing and limited community mobility 2/2 R hip pain. Pt is eager to return to PLOF with less pain and improved safety and independence. Pt currently requires minimal assist for LB dressing and bathing while in seated position due to pain and limited AROM of R hip. Pt and sister instructed in self care skills, falls prevention strategies, home/routines modifications, DME/AE for LB bathing and dressing tasks, and compression stocking mgt strategies. Handout provided to support recall and carryover. Pt would benefit from additional instruction in self care skills and techniques to help maintain precautions with or without assistive devices to support recall and carryover prior to discharge. Do not anticipate need for OT services upon discharges.     Follow Up Recommendations  No OT follow up    Equipment Recommendations  3 in 1 bedside commode    Recommendations for Other Services       Precautions / Restrictions Precautions Precautions: Fall Precaution Comments: orthostatic intolerance POD 0 & 1 Restrictions Weight Bearing Restrictions: No      Mobility Bed Mobility Overal bed mobility: Needs Assistance Bed Mobility: Sit to Supine       Sit to supine: Min assist   General bed mobility comments: minA of RLE  Transfers Overall transfer level:  Needs assistance Equipment used: Rolling walker (2 wheeled)   Sit to Stand: Supervision Stand pivot transfers: Supervision       General transfer comment: moving more quickly, more confidently to minimize prolonged standing.    Balance Overall balance assessment: Modified Independent;Mild deficits observed, not formally tested                                         ADL either performed or assessed with clinical judgement   ADL Overall ADL's : Needs assistance/impaired                                       General ADL Comments: Min A for LB ADL tasks, mod assist for compression stocking mgt     Vision Patient Visual Report: No change from baseline       Perception     Praxis      Pertinent Vitals/Pain Pain Assessment: 0-10 Pain Score: 2  Pain Location: operative limb (thigh) Pain Descriptors / Indicators: Aching Pain Intervention(s): Limited activity within patient's tolerance;Monitored during session;Repositioned     Hand Dominance Right   Extremity/Trunk Assessment Upper Extremity Assessment Upper Extremity Assessment: Overall WFL for tasks assessed   Lower Extremity Assessment Lower Extremity Assessment: Overall WFL for tasks assessed(expected post-op strength/ROM deficits following R THA)   Cervical / Trunk Assessment Cervical / Trunk Assessment: Normal   Communication Communication Communication:  No difficulties   Cognition Arousal/Alertness: Awake/alert Behavior During Therapy: WFL for tasks assessed/performed Overall Cognitive Status: Within Functional Limits for tasks assessed                                     General Comments       Exercises Total Joint Exercises Ankle Circles/Pumps: AROM;Both;15 reps;Supine Quad Sets: AROM;Right;10 reps;Supine Gluteal Sets: AROM;Both;10 reps;Supine Towel Squeeze: Strengthening;Both;10 reps;Supine Short Arc Quad: Right;AROM;10 reps;Supine Heel Slides:  AAROM;Right;Supine;10 reps;Limitations Heel Slides Limitations: minA/CGA provided by husband Hip ABduction/ADduction: AAROM;Right;10 reps;Supine;AROM;Limitations Hip Abduction/Adduction Limitations: minA/CGA provided by husband Long Arc Quad: AROM;Both;10 reps;Supine Other Exercises Other Exercises: pt/sister instructed in falls prevention strategies, AE/DME for ADL, compression stocking mgt, and home/routines modifications to maximize safety/indep with ADL; handout provided to support recall and carryover   Shoulder Instructions      Home Living Family/patient expects to be discharged to:: Private residence Living Arrangements: Spouse/significant other Available Help at Discharge: Family;Available 24 hours/day(several sisters live locally, plan to assist) Type of Home: House Home Access: Stairs to enter [employer] of Steps: 2 partial steps Entrance Stairs-Rails: None Home Layout: One level     Bathroom Shower/Tub: Occupational psychologist: Standard     Home Equipment: Cane - single point;Adaptive equipment Adaptive Equipment: Reacher        Prior Functioning/Environment Level of Independence: Independent with assistive device(s)        Comments: limited community distance AMB, difficulty with washing R foot and donning socks on R foot but "I managed"        OT Problem List: Pain;Decreased knowledge of use of DME or AE      OT Treatment/Interventions: Self-care/ADL training;Therapeutic exercise;Therapeutic activities;DME and/or AE instruction;Patient/family education    OT Goals(Current goals can be found in the care plan section) Acute Rehab OT Goals Patient Stated Goal: less pain, improved mobility and independence OT Goal Formulation: With patient/family Time For Goal Achievement: 07/01/19 Potential to Achieve Goals: Good ADL Goals Pt Will Perform Lower Body Dressing: sit to/from stand;with modified independence;with adaptive equipment Pt  Will Transfer to Toilet: with supervision;ambulating(BSC over toilet, LRAD for amb) Additional ADL Goal #1: Pt will independently instruct family/caregiver in compression stocking mgt  OT Frequency: Min 1X/week   Barriers to D/C:            Co-evaluation              AM-PAC OT "6 Clicks" Daily Activity     Outcome Measure Help from another person eating meals?: None Help from another person taking care of personal grooming?: None Help from another person toileting, which includes using toliet, bedpan, or urinal?: A Little Help from another person bathing (including washing, rinsing, drying)?: A Little Help from another person to put on and taking off regular upper body clothing?: None Help from another person to put on and taking off regular lower body clothing?: A Little 6 Click Score: 21   End of Session    Activity Tolerance: Patient tolerated treatment well Patient left: in bed;with call bell/phone within reach;with bed alarm set;with family/visitor present;with SCD's reapplied  OT Visit Diagnosis: Other abnormalities of gait and mobility (R26.89);Pain Pain - Right/Left: Right Pain - part of body: Hip                Time: 2355-7322 OT Time Calculation (min): 21 min Charges:  OT General Charges $OT Visit: 1  Visit OT Evaluation $OT Eval Moderate Complexity: 1 Mod OT Treatments $Self Care/Home Management : 8-22 mins  Richrd Prime, MPH, MS, OTR/L ascom [phone] 06/17/19, 4:53 PM

## 2019-06-17 NOTE — Progress Notes (Signed)
D: Pt alert and oriented x 4. Pt denies experiencing any pain at this time. Pt was OOB with PT to chair using RW.  A: Scheduled medications administered to pt, per MD orders. Support and encouragement provided. Frequent verbal contact made.   R: No adverse drug reactions noted. Pt complaint with medications and treatment plan. Pt interacts well with staff on the unit. Pt is stable at this time, Will continue to monitor and provide care for as ordered.

## 2019-06-17 NOTE — Progress Notes (Signed)
OT Cancellation Note  Patient Details Name: Lisa Steele MRN: [id number] DOB: 04/21/1945   Cancelled Treatment:    Reason Eval/Treat Not Completed: Other (comment). Consult received, chart reviewed. Pt reports not feeling well and very cold. Politely requests OT re-attempt later this afternoon for OT evaluation. Two warm blankets brought to pt, very appreciative. Will re-attempt at late time as appropriate.   Richrd Prime, MPH, MS, OTR/L ascom [phone] 06/17/19, 12:13 PM

## 2019-06-17 NOTE — Evaluation (Signed)
Physical Therapy Evaluation Patient Details Name: Lisa Steele MRN: [id number] DOB: 1945-08-20 Today's Date: 06/17/2019   History of Present Illness  Lisa Steele is a 66yoF who comes to [facility] on 06/16/19 for elective Rt THA. Pt underwent direct anterior approach with Dr. Rudene Christians, now [facility]. POD0 evaluation deferred due to PONV/pain control. 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 Pt was AMB limited community distances with [facility], lives with husband, both are retired.  Clinical Impression  Pt admitted with above diagnosis. Pt currently with functional limitations due to the deficits listed below (see "PT Problem List"). Upon entry, pt in bed, awake and agreeable to participate. The pt is alert and oriented x4, pleasant, conversational, and generally a good historian. Husband in room for session. HEP training commenced. ModA to EOB. BP stable from supine to sitting, but pt has orthostatic intolerance with >30sec standing, persistent nausea, dizziness, yawning, with only mild BP drop assessed seated s/p standing (no gross hypotension). Pt able to safely mobilize from bed to recliner without physical assist, but moving fairly slow. Pt/husband educated on S/Sx of orthostatic BP and how to modify activity for safety. Functional mobility assessment demonstrates increased effort/time requirements, poor tolerance, and need for physical assistance, whereas the patient performed these at a higher level of independence PTA. Pt will benefit from skilled PT intervention to increase independence and safety with basic mobility in preparation for discharge to the venue listed below.       Follow Up Recommendations Supervision for mobility/OOB;Home health PT    Equipment Recommendations  Rolling walker with 5" wheels;3in1 (PT)    Recommendations for Other Services       Precautions / Restrictions Precautions Precautions: Fall Precaution Comments: orthostatic intolerance POD 0 & 1 Restrictions Weight Bearing  Restrictions: No      Mobility  Bed Mobility Overal bed mobility: Needs Assistance Bed Mobility: Supine to Sit     Supine to sit: Mod assist     General bed mobility comments: husband provides hand for self-pull and provides AA/ROM of RLE OOB  Transfers Overall transfer level: Needs assistance Equipment used: Rolling walker (2 wheeled) Transfers: Sit to/from [facility] Sit to Stand: Min guard Stand pivot transfers: Min guard       General transfer comment: Has postural intolerance with >30seconds of standing (diphoresis, dizziness, nausea, yawning); unable to safely obtain astanding BP, but post standing shows 9mmHg SBP drop to 120s.  Ambulation/Gait   Gait Distance (Feet): (deferred)            Stairs            Wheelchair Mobility    Modified Rankin (Stroke Patients Only)       Balance Overall balance assessment: Modified Independent;Mild deficits observed, not formally tested                                           Pertinent Vitals/Pain Pain Assessment: 0-10 Pain Location: operative limb (thigh) Pain Descriptors / Indicators: Aching Pain Intervention(s): Limited activity within patient's tolerance;Monitored during session;Premedicated before session;Repositioned    Home Living Family/patient expects to be discharged to:: Private residence Living Arrangements: Spouse/significant other Available Help at Discharge: Family;Available 24 hours/day(several sisters live locally, plan to assist) Type of Home: House Home Access: Stairs to enter Entrance Stairs-Rails: None Entrance Stairs-Number of Steps: 2 partial steps Home Layout: One level Home Equipment: Kasandra Knudsen -  single point      Prior Function Level of Independence: Independent with assistive device(s)         Comments: limited community distance AMB     Hand Dominance        Extremity/Trunk Assessment   Upper Extremity Assessment Upper  Extremity Assessment: Overall WFL for tasks assessed    Lower Extremity Assessment Lower Extremity Assessment: Overall WFL for tasks assessed       Communication      Cognition Arousal/Alertness: Awake/alert Behavior During Therapy: WFL for tasks assessed/performed Overall Cognitive Status: Within Functional Limits for tasks assessed                                        General Comments      Exercises Total Joint Exercises Ankle Circles/Pumps: AROM;Both;10 reps Short Arc Quad: Right;AROM;10 reps;Supine Heel Slides: AAROM;Right;15 reps;Supine Hip ABduction/ADduction: AAROM;Right;10 reps;Supine;AROM   Assessment/Plan    PT Assessment Patient needs continued PT services  PT Problem List Decreased strength;Decreased range of motion;Decreased activity tolerance;Decreased balance;Decreased mobility       PT Treatment Interventions Balance training;DME instruction;Gait training;Stair training;Functional mobility training;Therapeutic exercise;Therapeutic activities;Patient/family education    PT Goals (Current goals can be found in the Care Plan section)  Acute Rehab PT Goals Patient Stated Goal: be able to AMB in home PT Goal Formulation: With patient Time For Goal Achievement: 07/01/19 Potential to Achieve Goals: Good    Frequency BID   Barriers to discharge Inaccessible home environment      Co-evaluation               AM-PAC PT "6 Clicks" Mobility  Outcome Measure Help needed turning from your back to your side while in a flat bed without using bedrails?: A Little Help needed moving from lying on your back to sitting on the side of a flat bed without using bedrails?: A Little Help needed moving to and from a bed to a chair (including a wheelchair)?: A Little Help needed standing up from a chair using your arms (e.g., wheelchair or bedside chair)?: A Little Help needed to walk in hospital room?: A Little Help needed climbing 3-5 steps with a  railing? : A Little 6 Click Score: 18    End of Session Equipment Utilized During Treatment: Gait belt Activity Tolerance: Patient tolerated treatment well;No increased pain;Treatment limited secondary to medical complications (Comment) Patient left: in chair;with family/visitor present;with chair alarm set;with SCD's reapplied Nurse Communication: Mobility status PT Visit Diagnosis: Difficulty in walking, not elsewhere classified (R26.2);Other abnormalities of gait and mobility (R26.89);Muscle weakness (generalized) (M62.81);Dizziness and giddiness (R42)    Time: 3810-1751 PT Time Calculation (min) (ACUTE ONLY): 51 min   Charges:   PT Evaluation $PT Eval Low Complexity: 1 Low PT Treatments $Therapeutic Exercise: 23-37 mins        12:17 PM, 06/17/19 Rosamaria Lints, PT, DPT Physical Therapist - [facility]  [phone] (ASCOM)     Anyelin Mogle C 06/17/2019, 12:11 PM

## 2019-06-18 DIAGNOSIS — R110 Nausea: Secondary | ICD-10-CM | POA: Diagnosis not present

## 2019-06-18 DIAGNOSIS — Z6832 Body mass index (BMI) 32.0-32.9, adult: Secondary | ICD-10-CM | POA: Diagnosis not present

## 2019-06-18 DIAGNOSIS — M170 Bilateral primary osteoarthritis of knee: Secondary | ICD-10-CM | POA: Diagnosis present

## 2019-06-18 DIAGNOSIS — Z885 Allergy status to narcotic agent status: Secondary | ICD-10-CM | POA: Diagnosis not present

## 2019-06-18 DIAGNOSIS — M160 Bilateral primary osteoarthritis of hip: Secondary | ICD-10-CM | POA: Diagnosis present

## 2019-06-18 DIAGNOSIS — E785 Hyperlipidemia, unspecified: Secondary | ICD-10-CM | POA: Diagnosis present

## 2019-06-18 DIAGNOSIS — Z880 Allergy status to penicillin: Secondary | ICD-10-CM | POA: Diagnosis not present

## 2019-06-18 DIAGNOSIS — Z86010 Personal history of colonic polyps: Secondary | ICD-10-CM | POA: Diagnosis not present

## 2019-06-18 DIAGNOSIS — I10 Essential (primary) hypertension: Secondary | ICD-10-CM | POA: Diagnosis present

## 2019-06-18 DIAGNOSIS — H269 Unspecified cataract: Secondary | ICD-10-CM | POA: Diagnosis present

## 2019-06-18 DIAGNOSIS — Z8249 Family history of ischemic heart disease and other diseases of the circulatory system: Secondary | ICD-10-CM | POA: Diagnosis not present

## 2019-06-18 DIAGNOSIS — Z881 Allergy status to other antibiotic agents status: Secondary | ICD-10-CM | POA: Diagnosis not present

## 2019-06-18 DIAGNOSIS — Z823 Family history of stroke: Secondary | ICD-10-CM | POA: Diagnosis not present

## 2019-06-18 DIAGNOSIS — M1991 Primary osteoarthritis, unspecified site: Secondary | ICD-10-CM | POA: Diagnosis present

## 2019-06-18 DIAGNOSIS — E7800 Pure hypercholesterolemia, unspecified: Secondary | ICD-10-CM | POA: Diagnosis present

## 2019-06-18 DIAGNOSIS — Z833 Family history of diabetes mellitus: Secondary | ICD-10-CM | POA: Diagnosis not present

## 2019-06-18 LAB — CBC
HCT: 29.4 % — ABNORMAL LOW (ref 36.0–46.0)
Hemoglobin: 9.9 g/dL — ABNORMAL LOW (ref 12.0–15.0)
MCH: 28.2 pg (ref 26.0–34.0)
MCHC: 33.7 g/dL (ref 30.0–36.0)
MCV: 83.8 fL (ref 80.0–100.0)
Platelets: 240 10*3/uL (ref 150–400)
RBC: 3.51 MIL/uL — ABNORMAL LOW (ref 3.87–5.11)
RDW: 15.2 % (ref 11.5–15.5)
WBC: 9.4 10*3/uL (ref 4.0–10.5)
nRBC: 0 % (ref 0.0–0.2)

## 2019-06-18 NOTE — Plan of Care (Signed)

## 2019-06-18 NOTE — Plan of Care (Signed)
  Problem: Activity: Goal: Risk for activity intolerance will decrease Outcome: Progressing   Problem: Nutrition: Goal: Adequate nutrition will be maintained Outcome: Progressing   Problem: Coping: Goal: Level of anxiety will decrease Outcome: Progressing   Problem: Elimination: Goal: Will not experience complications related to bowel motility Outcome: Progressing   Problem: Pain Managment: Goal: General experience of comfort will improve Outcome: Progressing   Problem: Skin Integrity: Goal: Risk for impaired skin integrity will decrease Outcome: Progressing   

## 2019-06-18 NOTE — Progress Notes (Signed)
  Subjective: 2 Days Post-Op Procedure(s) (LRB): TOTAL HIP ARTHROPLASTY ANTERIOR APPROACH (Right)  Patient seated in bedside chair upon entering room.  Husband present with patient. Patient reports pain as 5 on 0-10 scale.   Patient is well, but has had some minor complaints of URI symptoms and Nausea today.  Patient states she has had no vomiting since yesterday. Plan is to go Home after hospital stay. Negative for chest pain and shortness of breath Fever: no Patient has not had a bowel movement.  Objective: Vital signs in last 24 hours: Temp:  [98.7 F (37.1 C)-100.7 F (38.2 C)] 98.8 F (37.1 C) (04/17 0825) Pulse Rate:  [68-83] 73 (04/17 0825) Resp:  [16-17] 16 (04/17 0825) BP: (112-140)/(44-59) 122/55 (04/17 0825) SpO2:  [95 %-100 %] 96 % (04/17 0825)  Intake/Output from previous day:  Intake/Output Summary (Last 24 hours) at 06/18/2019 1127 Last data filed at 06/18/2019 0900 Gross per 24 hour  Intake 210 ml  Output --  Net 210 ml    Intake/Output this shift: Total I/O In: 160 [P.O.:160] Out: -   Labs: Recent Labs    06/16/19 0950 06/17/19 0502 06/18/19 0459  HGB 11.0* 11.5* 9.9*   Recent Labs    06/17/19 0502 06/18/19 0459  WBC 9.9 9.4  RBC 4.10 3.51*  HCT 34.7* 29.4*  PLT 250 240   Recent Labs    06/16/19 0950 06/17/19 0502  NA  --  138  K  --  3.8  CL  --  103  CO2  --  26  BUN  --  12  CREATININE 0.92 0.81  GLUCOSE  --  118*  CALCIUM  --  8.5*   No results for input(s): LABPT, INR in the last 72 hours.   EXAM General - Patient is Alert, Appropriate and Oriented Extremity - Neurovascular intact Dorsiflexion/Plantar flexion intact Compartment soft SCDs in place with TED hose. Dressing/Incision -Praveena dressing in place.  No drainage noted in canister. Motor Function - intact, moving foot and toes well on exam.  Cardiovascular- Regular rate and rhythm, no murmurs/rubs/gallops Respiratory- Lungs clear to auscultation  bilaterally Gastrointestinal- soft, nontender and hypoactive bowel sounds   Assessment/Plan: 2 Days Post-Op Procedure(s) (LRB): TOTAL HIP ARTHROPLASTY ANTERIOR APPROACH (Right) Active Problems:   Status post total hip replacement, right  Estimated body mass index is 32.8 kg/m as calculated from the following:   Height as of this encounter: 5\' 5"  (1.651 m).   Weight as of this encounter: 89.4 kg. Advance diet Up with therapy Plan for discharge tomorrow  Spoke with patient about importance of managing nausea with available medications as this will affect her ability to have a bowel movement and is important for maintaining her strength with therapy.  Patient understands.  DVT Prophylaxis - Lovenox, Ted hose and SCDs Weight-Bearing as tolerated to right leg  , PA-C [facility] Orthopaedic Surgery 06/18/2019, 11:27 AM

## 2019-06-18 NOTE — Progress Notes (Addendum)
Physical Therapy Treatment Patient Details Name: Lisa Steele MRN: [id number] DOB: [birth year] Today's Date: 06/18/2019    History of Present Illness Lisa Steele is a 24yoF who comes to [facility] on 06/16/19 for elective Rt THA. Pt underwent direct anterior approach with Dr. Rudene Christians, now [facility] POD0 evaluation deferred due to PONV/pain control. 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 Pt was AMB limited community distances with [facility], lives with husband, both are retired.    PT Comments    Pt is making good progress towards goals with improved functional independence. Pt with increased nausea symptoms this session, limiting further gait distance. Good endurance with HEP and pain well controlled. Pt continues to be motivated to participate. Will continue to progress as able. Still needs to do stairs prior to dc.   Follow Up Recommendations  Home health PT     Equipment Recommendations  Rolling walker with 5" wheels;3in1 (PT)    Recommendations for Other Services       Precautions / Restrictions Precautions Precautions: Anterior Hip;Fall Precaution Booklet Issued: Yes (comment) Restrictions Weight Bearing Restrictions: Yes RLE Weight Bearing: Weight bearing as tolerated    Mobility  Bed Mobility Overal bed mobility: Needs Assistance Bed Mobility: Sit to Supine       Sit to supine: Min assist   General bed mobility comments: needed slight assist for lifting B LEs onto bed. Once supine, safe technique performed for repositioning in bed.  Transfers Overall transfer level: Needs assistance Equipment used: Rolling walker (2 wheeled) Transfers: Sit to/from Stand Sit to Stand: Min guard         General transfer comment: correct hand placement. Once standing, upright posture noted  Ambulation/Gait Ambulation/Gait assistance: Min guard Gait Distance (Feet): 120 Feet Assistive device: Rolling walker (2 wheeled) Gait Pattern/deviations: Step-through pattern Gait velocity: 10' in 8  seconds   General Gait Details: ambulated with slower speed and recip gait pattern. Cues for posture. Becomes nauseated with exertion, limiting further distance. Returned back to [employer]in (Stroke Patients Only)       Balance Overall balance assessment: Modified Independent;Mild deficits observed, not formally tested                                          Cognition Arousal/Alertness: Awake/alert Behavior During Therapy: WFL for tasks assessed/performed Overall Cognitive Status: Within Functional Limits for tasks assessed                                        Exercises Other Exercises Other Exercises: Seated ther-ex performed on R LE including AP, quad sets, glut sets, heel slides, SAQ, hip abd/add, and hip add squeezes. All ther-ex performed x 10-12 reps with cga. Safe technique. Reviewed written HEP Other Exercises: ambulated to bathroom with safe technique and supervision. Able to perform hygiene correctly with safe body mechanics. Able to maintain standing balance while washing hands at sink    General Comments        Pertinent Vitals/Pain Pain Assessment: 0-10 Pain Score: 2  Pain Location: R LE Pain Descriptors / Indicators: Operative site guarding Pain Intervention(s): Limited activity within patient's tolerance;Repositioned    Home Living  Prior Function            PT Goals (current goals can now be found in the care plan section) Acute Rehab PT Goals Patient Stated Goal: I'm feeling slightly nauseated today PT Goal Formulation: With patient Time For Goal Achievement: 07/01/19 Potential to Achieve Goals: Good Progress towards PT goals: Progressing toward goals    Frequency    BID      PT Plan Current plan remains appropriate    Co-evaluation              AM-PAC PT "6 Clicks" Mobility   Outcome Measure  Help  needed turning from your back to your side while in a flat bed without using bedrails?: A Little Help needed moving from lying on your back to sitting on the side of a flat bed without using bedrails?: A Little Help needed moving to and from a bed to a chair (including a wheelchair)?: A Little Help needed standing up from a chair using your arms (e.g., wheelchair or bedside chair)?: A Little Help needed to walk in hospital room?: A Little Help needed climbing 3-5 steps with a railing? : A Little 6 Click Score: 18    End of Session Equipment Utilized During Treatment: Gait belt Activity Tolerance: Patient tolerated treatment well Patient left: in bed;with bed alarm set;with SCD's reapplied Nurse Communication: Mobility status PT Visit Diagnosis: Difficulty in walking, not elsewhere classified (R26.2);Other abnormalities of gait and mobility (R26.89);Muscle weakness (generalized) (M62.81);Dizziness and giddiness (R42)     Time: 4765-4650 PT Time Calculation (min) (ACUTE ONLY): 29 min  Charges:  $Gait Training: 8-22 mins $Therapeutic Exercise: 8-22 mins                     Elizabeth Palau, PT, DPT [phone]    Lisa Steele 06/18/2019, 3:33 PM

## 2019-06-18 NOTE — Progress Notes (Signed)
Physical Therapy Treatment Patient Details Name: Lisa Steele MRN: [id number] DOB: March 15, 1945 Today's Date: 06/18/2019    History of Present Illness Lisa Steele is a 74yoF who comes to [facility] on 06/16/19 for elective Rt THA. Pt underwent direct anterior approach with Dr. Rudene Christians, now [facility]. POD0 evaluation deferred due to PONV/pain control. 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 Pt was AMB limited community distances with [facility], lives with husband, both are retired.    PT Comments    Pt is making good progress towards goals with improved ambulation distance this date. Pt does report increased pain levels at 10/10 initially, however decreases to 7/10 with mobility efforts. Pt hasn't received pain meds or ate breakfast, requesting to do both. She just finished using bathroom upon arrival with tech assistance. Orthostatics performed. Supine: 122/55; Seated: 125/63; Stand: 136/71. Pt not symptomatic. Will continue to progress as able.   Follow Up Recommendations  Home health PT     Equipment Recommendations  Rolling walker with 5" wheels;3in1 (PT)    Recommendations for Other Services       Precautions / Restrictions Precautions Precautions: Anterior Hip;Fall Precaution Booklet Issued: Yes (comment) Restrictions Weight Bearing Restrictions: Yes RLE Weight Bearing: Weight bearing as tolerated    Mobility  Bed Mobility Overal bed mobility: Needs Assistance Bed Mobility: Supine to Sit     Supine to sit: Min assist     General bed mobility comments: safe technique with assist for R LE management. Once seated, upright posture noted.  Transfers Overall transfer level: Needs assistance Equipment used: Rolling walker (2 wheeled) Transfers: Sit to/from Stand Sit to Stand: Min assist         General transfer comment: cues for safe technique. Once standing, upright posture noted  Ambulation/Gait Ambulation/Gait assistance: Min guard Gait Distance (Feet): 120 Feet Assistive device:  Rolling walker (2 wheeled) Gait Pattern/deviations: Step-through pattern     General Gait Details: ambulated with reciprocal gait with slow speed. Occasional cues for upright posture. Pt fatigues with increased endurance.   Stairs             Wheelchair Mobility    Modified Rankin (Stroke Patients Only)       Balance                                            Cognition Arousal/Alertness: Awake/alert Behavior During Therapy: WFL for tasks assessed/performed Overall Cognitive Status: Within Functional Limits for tasks assessed                                        Exercises Other Exercises Other Exercises: deferred further ther-ex as breakfast tray entered. Will perform in PM    General Comments        Pertinent Vitals/Pain Pain Assessment: 0-10 Pain Score: 7  Pain Location: R LE Pain Descriptors / Indicators: Operative site guarding Pain Intervention(s): Limited activity within patient's tolerance;Ice applied;Repositioned    Home Living                      Prior Function            PT Goals (current goals can now be found in the care plan section) Acute Rehab PT Goals Patient Stated Goal: less pain, improved mobility  and independence PT Goal Formulation: With patient Time For Goal Achievement: 07/01/19 Potential to Achieve Goals: Good Progress towards PT goals: Progressing toward goals    Frequency    BID      PT Plan Current plan remains appropriate    Co-evaluation              AM-PAC PT "6 Clicks" Mobility   Outcome Measure  Help needed turning from your back to your side while in a flat bed without using bedrails?: A Little Help needed moving from lying on your back to sitting on the side of a flat bed without using bedrails?: A Little Help needed moving to and from a bed to a chair (including a wheelchair)?: A Little Help needed standing up from a chair using your arms (e.g.,  wheelchair or bedside chair)?: A Little Help needed to walk in Steele room?: A Little Help needed climbing 3-5 steps with a railing? : A Little 6 Click Score: 18    End of Session Equipment Utilized During Treatment: Gait belt Activity Tolerance: Patient tolerated treatment well Patient left: in chair;with chair alarm set;with SCD's reapplied Nurse Communication: Mobility status PT Visit Diagnosis: Difficulty in walking, not elsewhere classified (R26.2);Other abnormalities of gait and mobility (R26.89);Muscle weakness (generalized) (M62.81);Dizziness and giddiness (R42)     Time: 5956-3875 PT Time Calculation (min) (ACUTE ONLY): 23 min  Charges:  $Gait Training: 23-37 mins                     Lisa Steele, [place], DPT [phone]    [facility] 06/18/2019, 9:07 AM

## 2019-06-19 LAB — CBC
HCT: 28.1 % — ABNORMAL LOW (ref 36.0–46.0)
Hemoglobin: 9.4 g/dL — ABNORMAL LOW (ref 12.0–15.0)
MCH: 27.6 pg (ref 26.0–34.0)
MCHC: 33.5 g/dL (ref 30.0–36.0)
MCV: 82.6 fL (ref 80.0–100.0)
Platelets: 238 10*3/uL (ref 150–400)
RBC: 3.4 MIL/uL — ABNORMAL LOW (ref 3.87–5.11)
RDW: 14.9 % (ref 11.5–15.5)
WBC: 10.9 10*3/uL — ABNORMAL HIGH (ref 4.0–10.5)
nRBC: 0 % (ref 0.0–0.2)

## 2019-06-19 MED ORDER — ASPIRIN EC 325 MG PO TBEC: 325.0000 mg | DELAYED_RELEASE_TABLET | Freq: Every day | ORAL | 0 refills | Status: DC

## 2019-06-19 NOTE — Progress Notes (Signed)
Physical Therapy Treatment Patient Details Name: Lisa [facility] MRN: [id number] DOB: 10-13-1945 Today's Date: 06/19/2019    History of Present Illness Lisa Steele is a 77yoF who comes to [facility] on 06/16/19 for elective Rt THA. Pt underwent direct anterior approach with Dr. Rudene Christians, now [facility]. POD0 evaluation deferred due to PONV/pain control. 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 Pt was AMB limited community distances with [facility], lives with husband, both are retired.    PT Comments    Pt is making good progress towards goals with improved ambulation distance this session. No dizziness/nausea this date. Good endurance with there-ex and performed stair training safely. Pt has no further acute rehab needs. Discussed with RN, safe to dc home.    Follow Up Recommendations  Home health PT     Equipment Recommendations  Rolling walker with 5" wheels;3in1 (PT)    Recommendations for Other Services       Precautions / Restrictions Precautions Precautions: Anterior Hip;Fall Precaution Booklet Issued: Yes (comment) Restrictions Weight Bearing Restrictions: Yes RLE Weight Bearing: Weight bearing as tolerated    Mobility  Bed Mobility Overal bed mobility: Needs Assistance Bed Mobility: Supine to Sit;Sit to Supine     Supine to sit: Min assist Sit to supine: Min assist   General bed mobility comments: slight assist for B LE management. Once seated at EOB, safe technique performed  Transfers Overall transfer level: Needs assistance Equipment used: Rolling walker (2 wheeled) Transfers: Sit to/from Stand Sit to Stand: Supervision         General transfer comment: safe technique. Upright posture  Ambulation/Gait Ambulation/Gait assistance: Supervision Gait Distance (Feet): 300 Feet Assistive device: Rolling walker (2 wheeled) Gait Pattern/deviations: Step-through pattern Gait velocity: 10' in 8 seconds   General Gait Details: slow speed, reciprocal gait pattern. UPright posture. WC  follow secondary to fatigue   Stairs Stairs: Yes Stairs assistance: Min guard Stair Management: No rails;Backwards;With walker Number of Stairs: 2 General stair comments: PT demonstrated prior to performance. Safe technique    Wheelchair Mobility    Modified Rankin (Stroke Patients Only)       Balance Overall balance assessment: Modified Independent;Mild deficits observed, not formally tested                                          Cognition Arousal/Alertness: Awake/alert Behavior During Therapy: WFL for tasks assessed/performed Overall Cognitive Status: Within Functional Limits for tasks assessed                                        Exercises Other Exercises Other Exercises: supine/seated ther-ex performed on R LE including AP, quad sets, hip abd/add, and LAQ. All ther-ex performed x 15 reps with cga.     General Comments        Pertinent Vitals/Pain Pain Assessment: 0-10 Pain Score: 3  Pain Location: R LE Pain Descriptors / Indicators: Operative site guarding Pain Intervention(s): Limited activity within patient's tolerance    Home Living                      Prior Function            PT Goals (current goals can now be found in the care plan section) Acute Rehab PT Goals Patient Stated  Goal: I hope I can go home PT Goal Formulation: With patient Time For Goal Achievement: 07/01/19 Potential to Achieve Goals: Good Progress towards PT goals: Progressing toward goals    Frequency    BID      PT Plan Current plan remains appropriate    Co-evaluation              AM-PAC PT "6 Clicks" Mobility   Outcome Measure  Help needed turning from your back to your side while in a flat bed without using bedrails?: A Little Help needed moving from lying on your back to sitting on the side of a flat bed without using bedrails?: A Little Help needed moving to and from a bed to a chair (including a  wheelchair)?: A Little Help needed standing up from a chair using your arms (e.g., wheelchair or bedside chair)?: A Little Help needed to walk in hospital room?: A Little Help needed climbing 3-5 steps with a railing? : A Little 6 Click Score: 18    End of Session Equipment Utilized During Treatment: Gait belt Activity Tolerance: Patient tolerated treatment well Patient left: in bed;with bed alarm set;with SCD's reapplied Nurse Communication: Mobility status PT Visit Diagnosis: Difficulty in walking, not elsewhere classified (R26.2);Other abnormalities of gait and mobility (R26.89);Muscle weakness (generalized) (M62.81);Dizziness and giddiness (R42)     Time: 6629-4765 PT Time Calculation (min) (ACUTE ONLY): 26 min  Charges:  $Gait Training: 8-22 mins $Therapeutic Exercise: 8-22 mins                     Elizabeth Palau, PT, DPT [phone]    [facility] 06/19/2019, 8:56 AM

## 2019-06-19 NOTE — Progress Notes (Signed)
Patient is being discharged home today with HH/PT. DC/Rx and prevena care instruction given and both patient and husband acknowledged understanding. IV removed. NT helped patient get ready for discharge/husband bedside to transport patient home.

## 2019-06-19 NOTE — Progress Notes (Signed)
  Subjective: 3 Days Post-Op Procedure(s) (LRB): TOTAL HIP ARTHROPLASTY ANTERIOR APPROACH (Right) Patient reports pain as well-controlled.   Patient is well, and has had no acute complaints or problems. She denies any nausea today. Plan is to go Home after hospital stay. Negative for chest pain and shortness of breath Fever: no Gastrointestinal: negative for nausea and vomiting. Denies lightheadedness or dizzines.  Patient has had a bowel movement.  Objective: Vital signs in last 24 hours: Temp:  [98.2 F (36.8 C)-99.4 F (37.4 C)] 99.4 F (37.4 C) (04/18 0820) Pulse Rate:  [62-72] 72 (04/18 0820) Resp:  [16-20] 20 (04/18 0820) BP: (108-130)/(52-60) 130/52 (04/18 0820) SpO2:  [94 %-96 %] 94 % (04/18 0820)  Intake/Output from previous day: No intake or output data in the 24 hours ending 06/19/19 1143  Intake/Output this shift: No intake/output data recorded.  Labs: Recent Labs    06/17/19 0502 06/18/19 0459 06/19/19 1051  HGB 11.5* 9.9* 9.4*   Recent Labs    06/18/19 0459 06/19/19 1051  WBC 9.4 10.9*  RBC 3.51* 3.40*  HCT 29.4* 28.1*  PLT 240 238   Recent Labs    06/17/19 0502  NA 138  K 3.8  CL 103  CO2 26  BUN 12  CREATININE 0.81  GLUCOSE 118*  CALCIUM 8.5*   No results for input(s): LABPT, INR in the last 72 hours.   EXAM General - Patient is Alert, Appropriate and Oriented Extremity - Neurovascular intact Dorsiflexion/Plantar flexion intact Compartment soft; SCDs and TED hose in place. Dressing/Incision -Praveena in place. No drainage noted in cannister. Motor Function - intact, moving foot and toes well on exam.  Cardiovascular- Regular rate and rhythm, no murmurs/rubs/gallops Respiratory- Very faint crackles heard in left lower base, otherwise clear to auscultation bilaterally. Gastrointestinal- soft and nontender   Assessment/Plan: 3 Days Post-Op Procedure(s) (LRB): TOTAL HIP ARTHROPLASTY ANTERIOR APPROACH (Right) Active Problems:  Status post total hip replacement, right   Primary localized osteoarthritis  Estimated body mass index is 32.8 kg/m as calculated from the following:   Height as of this encounter: 5\' 5"  (1.651 m).   Weight as of this encounter: 89.4 kg. Discharge home with home health  Discharge home with [facility] portable unit.   DVT Prophylaxis - Lovenox, Ted hose and SCDs Weight-Bearing as tolerated to right leg  [facility] [employer] [facility] [employer], PA-C [facility] Orthopaedic Surgery 06/19/2019, 11:43 AM

## 2019-06-19 NOTE — Progress Notes (Signed)
Pt reported that she has not had a bowel movement since Wednesday 4/14. Pt received Bisacodyl 10mg  per rectal at 0641. Pt reports that she normally has a bowel movement daily. Pt instructed to call for help when ready to use the bathroom and the pt verbalized understanding. Pt reported that she had a settled night and had a bedside wash this morning. Pain is adequately controlled at time of note writing.

## 2019-06-20 LAB — SURGICAL PATHOLOGY

## 2019-06-21 DIAGNOSIS — Z86010 Personal history of colonic polyps: Secondary | ICD-10-CM | POA: Diagnosis not present

## 2019-06-21 DIAGNOSIS — Z9012 Acquired absence of left breast and nipple: Secondary | ICD-10-CM | POA: Diagnosis not present

## 2019-06-21 DIAGNOSIS — E785 Hyperlipidemia, unspecified: Secondary | ICD-10-CM | POA: Diagnosis not present

## 2019-06-21 DIAGNOSIS — Z96641 Presence of right artificial hip joint: Secondary | ICD-10-CM | POA: Diagnosis not present

## 2019-06-21 DIAGNOSIS — M65312 Trigger thumb, left thumb: Secondary | ICD-10-CM | POA: Diagnosis not present

## 2019-06-21 DIAGNOSIS — M1612 Unilateral primary osteoarthritis, left hip: Secondary | ICD-10-CM | POA: Diagnosis not present

## 2019-06-21 DIAGNOSIS — Z471 Aftercare following joint replacement surgery: Secondary | ICD-10-CM | POA: Diagnosis not present

## 2019-06-21 DIAGNOSIS — Z6834 Body mass index (BMI) 34.0-34.9, adult: Secondary | ICD-10-CM | POA: Diagnosis not present

## 2019-06-21 DIAGNOSIS — I10 Essential (primary) hypertension: Secondary | ICD-10-CM | POA: Diagnosis not present

## 2019-06-21 DIAGNOSIS — Z7982 Long term (current) use of aspirin: Secondary | ICD-10-CM | POA: Diagnosis not present

## 2019-06-21 DIAGNOSIS — M170 Bilateral primary osteoarthritis of knee: Secondary | ICD-10-CM | POA: Diagnosis not present

## 2019-06-23 DIAGNOSIS — I10 Essential (primary) hypertension: Secondary | ICD-10-CM | POA: Diagnosis not present

## 2019-06-23 DIAGNOSIS — Z471 Aftercare following joint replacement surgery: Secondary | ICD-10-CM | POA: Diagnosis not present

## 2019-06-23 DIAGNOSIS — M170 Bilateral primary osteoarthritis of knee: Secondary | ICD-10-CM | POA: Diagnosis not present

## 2019-06-23 DIAGNOSIS — M1612 Unilateral primary osteoarthritis, left hip: Secondary | ICD-10-CM | POA: Diagnosis not present

## 2019-06-23 DIAGNOSIS — M65312 Trigger thumb, left thumb: Secondary | ICD-10-CM | POA: Diagnosis not present

## 2019-06-23 DIAGNOSIS — E785 Hyperlipidemia, unspecified: Secondary | ICD-10-CM | POA: Diagnosis not present

## 2019-06-24 DIAGNOSIS — E785 Hyperlipidemia, unspecified: Secondary | ICD-10-CM | POA: Diagnosis not present

## 2019-06-24 DIAGNOSIS — M65312 Trigger thumb, left thumb: Secondary | ICD-10-CM | POA: Diagnosis not present

## 2019-06-24 DIAGNOSIS — I10 Essential (primary) hypertension: Secondary | ICD-10-CM | POA: Diagnosis not present

## 2019-06-24 DIAGNOSIS — M1612 Unilateral primary osteoarthritis, left hip: Secondary | ICD-10-CM | POA: Diagnosis not present

## 2019-06-24 DIAGNOSIS — Z471 Aftercare following joint replacement surgery: Secondary | ICD-10-CM | POA: Diagnosis not present

## 2019-06-24 DIAGNOSIS — M170 Bilateral primary osteoarthritis of knee: Secondary | ICD-10-CM | POA: Diagnosis not present

## 2019-06-27 DIAGNOSIS — E785 Hyperlipidemia, unspecified: Secondary | ICD-10-CM | POA: Diagnosis not present

## 2019-06-27 DIAGNOSIS — M170 Bilateral primary osteoarthritis of knee: Secondary | ICD-10-CM | POA: Diagnosis not present

## 2019-06-27 DIAGNOSIS — Z471 Aftercare following joint replacement surgery: Secondary | ICD-10-CM | POA: Diagnosis not present

## 2019-06-27 DIAGNOSIS — M1612 Unilateral primary osteoarthritis, left hip: Secondary | ICD-10-CM | POA: Diagnosis not present

## 2019-06-27 DIAGNOSIS — M65312 Trigger thumb, left thumb: Secondary | ICD-10-CM | POA: Diagnosis not present

## 2019-06-27 DIAGNOSIS — I10 Essential (primary) hypertension: Secondary | ICD-10-CM | POA: Diagnosis not present

## 2019-06-29 DIAGNOSIS — M170 Bilateral primary osteoarthritis of knee: Secondary | ICD-10-CM | POA: Diagnosis not present

## 2019-06-29 DIAGNOSIS — M65312 Trigger thumb, left thumb: Secondary | ICD-10-CM | POA: Diagnosis not present

## 2019-06-29 DIAGNOSIS — E785 Hyperlipidemia, unspecified: Secondary | ICD-10-CM | POA: Diagnosis not present

## 2019-06-29 DIAGNOSIS — Z471 Aftercare following joint replacement surgery: Secondary | ICD-10-CM | POA: Diagnosis not present

## 2019-06-29 DIAGNOSIS — I10 Essential (primary) hypertension: Secondary | ICD-10-CM | POA: Diagnosis not present

## 2019-06-29 DIAGNOSIS — M1612 Unilateral primary osteoarthritis, left hip: Secondary | ICD-10-CM | POA: Diagnosis not present

## 2019-06-30 DIAGNOSIS — E785 Hyperlipidemia, unspecified: Secondary | ICD-10-CM | POA: Diagnosis not present

## 2019-06-30 DIAGNOSIS — Z471 Aftercare following joint replacement surgery: Secondary | ICD-10-CM | POA: Diagnosis not present

## 2019-06-30 DIAGNOSIS — M65312 Trigger thumb, left thumb: Secondary | ICD-10-CM | POA: Diagnosis not present

## 2019-06-30 DIAGNOSIS — I10 Essential (primary) hypertension: Secondary | ICD-10-CM | POA: Diagnosis not present

## 2019-06-30 DIAGNOSIS — M1612 Unilateral primary osteoarthritis, left hip: Secondary | ICD-10-CM | POA: Diagnosis not present

## 2019-06-30 DIAGNOSIS — M170 Bilateral primary osteoarthritis of knee: Secondary | ICD-10-CM | POA: Diagnosis not present

## 2019-07-05 DIAGNOSIS — M1612 Unilateral primary osteoarthritis, left hip: Secondary | ICD-10-CM | POA: Diagnosis not present

## 2019-07-05 DIAGNOSIS — M170 Bilateral primary osteoarthritis of knee: Secondary | ICD-10-CM | POA: Diagnosis not present

## 2019-07-05 DIAGNOSIS — M65312 Trigger thumb, left thumb: Secondary | ICD-10-CM | POA: Diagnosis not present

## 2019-07-05 DIAGNOSIS — I10 Essential (primary) hypertension: Secondary | ICD-10-CM | POA: Diagnosis not present

## 2019-07-05 DIAGNOSIS — E785 Hyperlipidemia, unspecified: Secondary | ICD-10-CM | POA: Diagnosis not present

## 2019-07-05 DIAGNOSIS — Z471 Aftercare following joint replacement surgery: Secondary | ICD-10-CM | POA: Diagnosis not present

## 2019-07-08 DIAGNOSIS — M170 Bilateral primary osteoarthritis of knee: Secondary | ICD-10-CM | POA: Diagnosis not present

## 2019-07-08 DIAGNOSIS — Z471 Aftercare following joint replacement surgery: Secondary | ICD-10-CM | POA: Diagnosis not present

## 2019-07-08 DIAGNOSIS — M65312 Trigger thumb, left thumb: Secondary | ICD-10-CM | POA: Diagnosis not present

## 2019-07-08 DIAGNOSIS — M1612 Unilateral primary osteoarthritis, left hip: Secondary | ICD-10-CM | POA: Diagnosis not present

## 2019-07-08 DIAGNOSIS — E785 Hyperlipidemia, unspecified: Secondary | ICD-10-CM | POA: Diagnosis not present

## 2019-07-08 DIAGNOSIS — I10 Essential (primary) hypertension: Secondary | ICD-10-CM | POA: Diagnosis not present

## 2019-07-11 DIAGNOSIS — Z471 Aftercare following joint replacement surgery: Secondary | ICD-10-CM | POA: Diagnosis not present

## 2019-07-11 DIAGNOSIS — E785 Hyperlipidemia, unspecified: Secondary | ICD-10-CM | POA: Diagnosis not present

## 2019-07-11 DIAGNOSIS — M1612 Unilateral primary osteoarthritis, left hip: Secondary | ICD-10-CM | POA: Diagnosis not present

## 2019-07-11 DIAGNOSIS — M170 Bilateral primary osteoarthritis of knee: Secondary | ICD-10-CM | POA: Diagnosis not present

## 2019-07-11 DIAGNOSIS — M65312 Trigger thumb, left thumb: Secondary | ICD-10-CM | POA: Diagnosis not present

## 2019-07-11 DIAGNOSIS — I10 Essential (primary) hypertension: Secondary | ICD-10-CM | POA: Diagnosis not present

## 2019-07-13 DIAGNOSIS — M170 Bilateral primary osteoarthritis of knee: Secondary | ICD-10-CM | POA: Diagnosis not present

## 2019-07-13 DIAGNOSIS — M65312 Trigger thumb, left thumb: Secondary | ICD-10-CM | POA: Diagnosis not present

## 2019-07-13 DIAGNOSIS — I10 Essential (primary) hypertension: Secondary | ICD-10-CM | POA: Diagnosis not present

## 2019-07-13 DIAGNOSIS — E785 Hyperlipidemia, unspecified: Secondary | ICD-10-CM | POA: Diagnosis not present

## 2019-07-13 DIAGNOSIS — M1612 Unilateral primary osteoarthritis, left hip: Secondary | ICD-10-CM | POA: Diagnosis not present

## 2019-07-13 DIAGNOSIS — Z471 Aftercare following joint replacement surgery: Secondary | ICD-10-CM | POA: Diagnosis not present

## 2019-07-21 DIAGNOSIS — I10 Essential (primary) hypertension: Secondary | ICD-10-CM | POA: Diagnosis not present

## 2019-07-21 DIAGNOSIS — Z7982 Long term (current) use of aspirin: Secondary | ICD-10-CM | POA: Diagnosis not present

## 2019-07-21 DIAGNOSIS — M1612 Unilateral primary osteoarthritis, left hip: Secondary | ICD-10-CM | POA: Diagnosis not present

## 2019-07-21 DIAGNOSIS — Z9012 Acquired absence of left breast and nipple: Secondary | ICD-10-CM | POA: Diagnosis not present

## 2019-07-21 DIAGNOSIS — Z6834 Body mass index (BMI) 34.0-34.9, adult: Secondary | ICD-10-CM | POA: Diagnosis not present

## 2019-07-21 DIAGNOSIS — Z86010 Personal history of colonic polyps: Secondary | ICD-10-CM | POA: Diagnosis not present

## 2019-07-21 DIAGNOSIS — M65312 Trigger thumb, left thumb: Secondary | ICD-10-CM | POA: Diagnosis not present

## 2019-07-21 DIAGNOSIS — E785 Hyperlipidemia, unspecified: Secondary | ICD-10-CM | POA: Diagnosis not present

## 2019-07-21 DIAGNOSIS — Z96641 Presence of right artificial hip joint: Secondary | ICD-10-CM | POA: Diagnosis not present

## 2019-07-21 DIAGNOSIS — Z471 Aftercare following joint replacement surgery: Secondary | ICD-10-CM | POA: Diagnosis not present

## 2019-07-21 DIAGNOSIS — M170 Bilateral primary osteoarthritis of knee: Secondary | ICD-10-CM | POA: Diagnosis not present

## 2019-07-27 DIAGNOSIS — Z471 Aftercare following joint replacement surgery: Secondary | ICD-10-CM | POA: Diagnosis not present

## 2019-07-27 DIAGNOSIS — M1612 Unilateral primary osteoarthritis, left hip: Secondary | ICD-10-CM | POA: Diagnosis not present

## 2019-07-27 DIAGNOSIS — M170 Bilateral primary osteoarthritis of knee: Secondary | ICD-10-CM | POA: Diagnosis not present

## 2019-07-27 DIAGNOSIS — M65312 Trigger thumb, left thumb: Secondary | ICD-10-CM | POA: Diagnosis not present

## 2019-07-27 DIAGNOSIS — I10 Essential (primary) hypertension: Secondary | ICD-10-CM | POA: Diagnosis not present

## 2019-07-27 DIAGNOSIS — E785 Hyperlipidemia, unspecified: Secondary | ICD-10-CM | POA: Diagnosis not present

## 2019-07-29 DIAGNOSIS — M1711 Unilateral primary osteoarthritis, right knee: Secondary | ICD-10-CM | POA: Diagnosis not present

## 2019-07-29 DIAGNOSIS — Z96641 Presence of right artificial hip joint: Secondary | ICD-10-CM | POA: Diagnosis not present

## 2019-08-03 ENCOUNTER — Telehealth: Payer: Self-pay | Admitting: Family Medicine

## 2019-08-03 NOTE — Chronic Care Management (AMB) (Signed)
  Chronic Care Management   Note  08/03/2019 Name: Noorah Giammona MRN: [id number] DOB: May 12, 1945  Kendyl Festa is a 74 y.o. year old female who is a primary care patient of Steele Sizer, MD. I reached out to Darrin Nipper by phone today in response to a referral sent by Ms. Joaquim Lai Osei's health plan.     Ms. Lehr was given information about Chronic Care Management services today including:  1. CCM service includes personalized support from designated clinical staff supervised by her physician, including individualized plan of care and coordination with other care providers 2. 24/7 contact phone numbers for assistance for urgent and routine care needs. 3. Service will only be billed when office clinical staff spend 20 minutes or more in a month to coordinate care. 4. Only one practitioner may furnish and bill the service in a calendar month. 5. The patient may stop CCM services at any time (effective at the end of the month) by phone call to the office staff. 6. The patient will be responsible for cost sharing (co-pay) of up to 20% of the service fee (after annual deductible is met).  Patient agreed to services and verbal consent obtained.   Follow up plan: Telephone appointment with care management team member scheduled for:08/31/2019  Noreene Larsson, [address], [place], [place] [address] Direct Dial: [phone] [email or web address]

## 2019-08-31 ENCOUNTER — Ambulatory Visit: Payer: Self-pay

## 2019-08-31 ENCOUNTER — Telehealth: Payer: Medicare Other

## 2019-08-31 NOTE — Chronic Care Management (AMB) (Signed)
  Chronic Care Management   Outreach Note  08/31/2019 Name: Lisa Steele MRN: [id number] DOB: February 14, 1946  Primary Care Provider: [name], MD Reason for referral : Chronic Care Management   An unsuccessful telephone outreach was attempted today. Ms. Fritchman was referred to the case management team for assistance with care management and care coordination.   A HIPAA compliant voice message was left today requesting a return call.     Follow Up Plan: The care management team will reach out to Ms. Eilts again within the next two to three weeks.    [facility] Care Management [phone]

## 2019-09-12 ENCOUNTER — Ambulatory Visit
Admission: RE | Admit: 2019-09-12 | Discharge: 2019-09-12 | Disposition: A | Discharge status: Home / Self Care | Payer: Medicare Other | Source: Ambulatory Visit | Attending: Obstetrics and Gynecology | Admitting: Obstetrics and Gynecology

## 2019-09-12 DIAGNOSIS — Z1231 Encounter for screening mammogram for malignant neoplasm of breast: Secondary | ICD-10-CM | POA: Diagnosis not present

## 2019-09-13 ENCOUNTER — Encounter: Payer: Self-pay | Admitting: Family Medicine

## 2019-09-14 DIAGNOSIS — R399 Unspecified symptoms and signs involving the genitourinary system: Secondary | ICD-10-CM | POA: Diagnosis not present

## 2019-09-14 DIAGNOSIS — N390 Urinary tract infection, site not specified: Secondary | ICD-10-CM | POA: Diagnosis not present

## 2019-09-14 DIAGNOSIS — R310 Gross hematuria: Secondary | ICD-10-CM | POA: Diagnosis not present

## 2019-09-14 DIAGNOSIS — A499 Bacterial infection, unspecified: Secondary | ICD-10-CM | POA: Diagnosis not present

## 2019-09-19 ENCOUNTER — Ambulatory Visit: Payer: Medicare Other

## 2019-09-19 DIAGNOSIS — M160 Bilateral primary osteoarthritis of hip: Secondary | ICD-10-CM | POA: Diagnosis not present

## 2019-09-19 DIAGNOSIS — Z96641 Presence of right artificial hip joint: Secondary | ICD-10-CM | POA: Diagnosis not present

## 2019-09-20 NOTE — Chronic Care Management (AMB) (Signed)
  Chronic Care Management   Outreach Note   Name: Lisa Steele MRN: [id number] DOB: 07/09/1945  Primary Care Provider: [name], MD Reason for referral : Chronic Care Management   Ms. Manzi was referred to the case management team for assistance with care management and care coordination. Outreach to complete a telephonic assessment was attempted today. Ms. Grist requested to call back at a later time.    Follow Up Plan: -Pending return call. -Will tentatively schedule outreach if call is not returned within the next two weeks.   [facility] Care Management [phone]

## 2019-09-20 NOTE — Progress Notes (Signed)
Patient ID: Shivangi Lutz, female    DOB: 07-28-1945, 74 y.o.   MRN: [id number]  PCP: [name], MD  Chief Complaint  Patient presents with  . Follow-up    Patient following up urgent care visit for UTI, treated with Cipro, patient states symptoms have resolved    Subjective:   Angellee Cohill is a 74 y.o. female, presents to clinic with CC of the following:  Chief Complaint  Patient presents with  . Follow-up    Patient following up urgent care visit for UTI, treated with Cipro, patient states symptoms have resolved    HPI:  Patient is a 74 year old female patient of Dr. Carlynn Purl She went to urgent care on 09/14/2019 She follows up here today.  Her history recorded for the urgent care visit included: Javia Dillow is a 74 y.o., female who presents with 4 to 5-day history of urinary frequency and dysuria, with occasional gross hematuria. Patient reports rare UTI. Also concerned about recent hip surgery (3 months prior), and taking indomethacin since that time. No attempt to treat prior to arrival, although did discontinue indomethacin a couple of days ago out of concern. Has bilateral lower lumbar discomfort. No complaints of flank pain; no fever chills; no nausea or vomiting. Was catheterized during hip surgery. Otherwise doing well. The assessment/plan was as follows:  1. Bacterial UTI - ciprofloxacin HCl (CIPRO) 250 MG tablet; Take 1 tablet (250 mg total) by mouth 2 (two) times daily for 7 days Dispense: 14 tablet; Refill: 0 2. Hematuria, gross 3. UTI symptoms Based on patient's medication allergies and other medications (ACE/ARB), will place on Cipro twice daily for 1 week.  Recommend repeat urinalysis in 7 to 10 days-to ensure hematuria has cleared.  Referred to PCP for same.  The urine culture grew Klebsiella, sensitive to Cipro.  Since that visit, he has completed the Cipro course, and noted her symptoms have resolved.  She has had no further bleeding, and the  urinary frequency has resolved.  (She stated she was up just about every hour overnight before the infection was treated).  Did note to her the urine culture results and that it was sensitive to the antibiotic that was prescribed.  She noted she did have a follow-up for her hip on Monday, and she noted she has some exercises to do to try to continue to improve.  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  Patient Active Problem List   Diagnosis Date Noted  . Primary localized osteoarthritis 06/18/2019  . Status post total hip replacement, right 06/16/2019  . Trigger finger of left thumb 03/04/2018  . Left hand pain 02/03/2018  . S/P rotator cuff repair 01/12/2018  . Injury of left rotator cuff 11/03/2017  . Obesity (BMI 30.0-34.9) 02/19/2017  . Primary osteoarthritis of both knees 06/22/2014  . Obesity (BMI 30-39.9) 06/22/2014  . Dysmetabolic syndrome 06/22/2014  . Post-menopausal 06/22/2014  . H/O cold sores 06/22/2014  . Dyslipidemia 06/22/2014  . Elevated fasting blood sugar 06/22/2014  . Benign essential HTN 06/22/2014  . Abnormal ECG 06/22/2014      Current Outpatient Medications:  .  aspirin EC 81 MG tablet, Take 81 mg by mouth daily. Swallow whole., Disp: , Rfl:  .  indomethacin (INDOCIN) 25 MG capsule, Take 25 mg by mouth 2 (two) times daily., Disp: , Rfl:  .  olmesartan-hydrochlorothiazide (BENICAR HCT) 40-25 MG tablet, Take 1 tablet by mouth daily., Disp: 90 tablet, Rfl: 1 .  Polyethyl Glycol-Propyl Glycol (SYSTANE) 0.4-0.3 % SOLN, Place 1 drop into both eyes at bedtime., Disp: , Rfl:  .  rosuvastatin (CRESTOR) 10 MG tablet, TAKE 1 TABLET BY MOUTH  DAILY (Patient taking differently: Take 10 mg by mouth daily. ), Disp: 90 tablet, Rfl: 3   Allergies  Allergen Reactions  . Tramadol Nausea And Vomiting  . Doxycycline Itching  . Penicillins  Hives and Rash    leg lesions and weakness (couldn't walk)  Did it involve swelling of the face/tongue/throat, SOB, or low BP? No Did it involve sudden or severe rash/hives, skin peeling, or any reaction on the inside of your mouth or nose? No Did you need to seek medical attention at a hospital or doctor's office? Yes When did it last happen?38 years If all above answers are "NO", may proceed with cephalosporin use.     Past Surgical History:  Procedure Laterality Date  . APPENDECTOMY    . BREAST EXCISIONAL BIOPSY Left 1970   neg  . BREAST SURGERY Left    cyst removed  . BUNIONECTOMY    . CATARACT EXTRACTION, BILATERAL  10/2013  . COLONOSCOPY WITH PROPOFOL N/A 06/25/2016   Procedure: COLONOSCOPY WITH PROPOFOL;  Surgeon: Scot Jun, MD;  Location: [facility] ENDOSCOPY;  Service: Endoscopy;  Laterality: N/A;  . ECTOPIC PREGNANCY SURGERY    . EYE SURGERY    . SHOULDER ARTHROSCOPY WITH ROTATOR CUFF REPAIR AND SUBACROMIAL DECOMPRESSION Left 11/25/2017   Procedure: SHOULDER ARTHROSCOPic release of long head biceps tendon and mini open rotator cuff repair;  Surgeon: Erin Sons, MD;  Location: [facility] ORS;  Service: Orthopedics;  Laterality: Left;  . TONSILECTOMY, ADENOIDECTOMY, BILATERAL MYRINGOTOMY AND TUBES    . TONSILLECTOMY    . 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   Procedure: TOTAL HIP ARTHROPLASTY ANTERIOR APPROACH;  Surgeon: Kennedy Bucker, MD;  Location: [facility] ORS;  Service: Orthopedics;  Laterality: Right;     Family History  Problem Relation Age of Onset  . Diabetes Mother   . Hypertension Mother   . Heart disease Mother   . Hyperlipidemia Mother   . Heart disease Father   . Diabetes Sister   . Heart disease Sister   . Hypertension Sister   . Hyperlipidemia Sister   . Breast cancer Sister 38  . Cancer Sister 65       breast  . Breast cancer Maternal Aunt   . Diabetes Sister   . Stroke Sister   . Diabetes Sister   . Diabetes Sister      Social History    Tobacco Use  . Smoking status: Former Smoker    Years: 10.00    Types: Cigarettes    Start date: 03/03/1958  . Smokeless tobacco: Never Used  Substance Use Topics  . Alcohol use: Yes    Alcohol/week: 0.0 standard drinks    Comment: socially    With staff assistance, above reviewed with the patient today.  ROS: As per HPI, otherwise no specific complaints on a limited and focused system review   Results for orders placed or performed in visit on 09/21/19 (from the past 72 hour(s))  POCT Urinalysis Dipstick     Status: Normal   Collection Time: 09/21/19  9:21 AM  Result Value Ref Range   Color, UA Yellow    Clarity, UA Clear    Glucose, UA Negative Negative   Bilirubin, UA Negative  Ketones, UA Negative    Spec Grav, UA 1.015 1.010 - 1.025   Blood, UA Negative    pH, UA 6.0 5.0 - 8.0   Protein, UA Negative Negative   Urobilinogen, UA 0.2 0.2 or 1.0 E.U./dL   Nitrite, UA Negative    Leukocytes, UA Negative Negative   Appearance Normal    Odor Normal      PHQ2/9: Depression screen [facility] 2/9 09/21/2019 04/18/2019 03/15/2019 11/15/2018 07/13/2018  Decreased Interest 0 0 0 0 0  Down, Depressed, Hopeless 0 0 0 0 0  PHQ - 2 Score 0 0 0 0 0  Altered sleeping 0 0 - 0 0  Tired, decreased energy 0 0 - 0 0  Change in appetite 0 0 - 0 0  Feeling bad or failure about yourself  0 0 - 0 0  Trouble concentrating 0 0 - 0 0  Moving slowly or fidgety/restless 0 0 - 0 0  Suicidal thoughts 0 0 - 0 0  PHQ-9 Score 0 0 - 0 0  Difficult doing work/chores Not difficult at all Not difficult at all - - -   PHQ-2/9 Result is neg  Fall Risk: Fall Risk  09/21/2019 04/18/2019 03/15/2019 11/15/2018 07/13/2018  Falls in the past year? 0 0 0 0 0  Number falls in past yr: 0 0 0 0 0  Comment - - - - -  Injury with Fall? 0 0 0 0 0  Comment - - - - -  Risk for fall due to : - - Orthopedic patient - -  Follow up - - Falls prevention discussed - -      Objective:   Vitals:   09/21/19 0902  Pulse: 88   Resp: 16  Temp: 97.9 F (36.6 C)  TempSrc: Temporal  SpO2: 100%  Weight: 199 lb (90.3 kg)  Height: 5\' 3"  (1.6 m)    Body mass index is 35.25 kg/m.  Physical Exam   NAD, masked, pleasant HEENT - [place]/AT, sclera anicteric, PERRL,  conj - non-inj'ed, pharynx clear Neck - supple, no adenopathy,  Car - RRR without m/g/r Pulm- RR and effort normal at rest, CTA without wheeze or rales Abd - soft, NT diffusely, Back - no CVA tenderness Neuro/psychiatric - affect was not flat, appropriate with conversation  Alert with speech normal  Has a cane to help her ambulate.  She was able to get up from the chair and walk to the table and get up the 1 step onto the exam table without assistance.    Results for orders placed or performed in visit on 09/21/19  POCT Urinalysis Dipstick  Result Value Ref Range   Color, UA Yellow    Clarity, UA Clear    Glucose, UA Negative Negative   Bilirubin, UA Negative    Ketones, UA Negative    Spec Grav, UA 1.015 1.010 - 1.025   Blood, UA Negative    pH, UA 6.0 5.0 - 8.0   Protein, UA Negative Negative   Urobilinogen, UA 0.2 0.2 or 1.0 E.U./dL   Nitrite, UA Negative    Leukocytes, UA Negative Negative   Appearance Normal    Odor Normal    Urine dip in the office as above    Assessment & Plan:    1. Dysuria/UTI -Klebsiella by culture, symptoms resolved after treatment with Cipro  Recheck urine dip in the office today completely negative To follow-up again if symptoms return. - POCT Urinalysis Dipstick  2.  Status post total  hip replacement, right Continue to follow-up with orthopedics, with some concerns for heterotopic ossification noted on most recent follow-up  Patient noted she has a follow-up with Dr. Carlynn PurlSowles again in 1 month's time, and to keep that follow-up  Jamelle HaringLIFFORD D Rebel Willcutt, MD 09/21/19 9:31 AM

## 2019-09-21 ENCOUNTER — Ambulatory Visit (INDEPENDENT_AMBULATORY_CARE_PROVIDER_SITE_OTHER): Payer: Medicare Other | Admitting: Internal Medicine

## 2019-09-21 ENCOUNTER — Other Ambulatory Visit: Payer: Self-pay

## 2019-09-21 ENCOUNTER — Encounter: Payer: Self-pay | Admitting: Internal Medicine

## 2019-09-21 VITALS — BP 122/68 | HR 88 | Temp 97.9°F | Resp 16 | Ht 63.0 in | Wt 199.0 lb

## 2019-09-21 DIAGNOSIS — Z96641 Presence of right artificial hip joint: Secondary | ICD-10-CM

## 2019-09-21 DIAGNOSIS — R300 Dysuria: Secondary | ICD-10-CM | POA: Diagnosis not present

## 2019-09-21 LAB — POCT URINALYSIS DIPSTICK
Appearance: NORMAL
Bilirubin, UA: NEGATIVE
Blood, UA: NEGATIVE
Glucose, UA: NEGATIVE
Ketones, UA: NEGATIVE
Leukocytes, UA: NEGATIVE
Nitrite, UA: NEGATIVE
Odor: NORMAL
Protein, UA: NEGATIVE
Spec Grav, UA: 1.015 (ref 1.010–1.025)
Urobilinogen, UA: 0.2 E.U./dL
pH, UA: 6 (ref 5.0–8.0)

## 2019-10-03 ENCOUNTER — Telehealth: Payer: Self-pay

## 2019-10-11 DIAGNOSIS — M7672 Peroneal tendinitis, left leg: Secondary | ICD-10-CM | POA: Diagnosis not present

## 2019-10-17 ENCOUNTER — Ambulatory Visit: Payer: Medicare Other | Admitting: Family Medicine

## 2019-10-25 DIAGNOSIS — Z09 Encounter for follow-up examination after completed treatment for conditions other than malignant neoplasm: Secondary | ICD-10-CM | POA: Insufficient documentation

## 2019-10-25 NOTE — Patient Instructions (Signed)
   Managing Your Hypertension Hypertension is commonly called high blood pressure. This is when the force of your blood pressing against the walls of your arteries is too strong. Arteries are blood vessels that carry blood from your heart throughout your body. Hypertension forces the heart to work harder to pump blood, and may cause the arteries to become narrow or stiff. Having untreated or uncontrolled hypertension can cause heart attack, stroke, kidney disease, and other problems. What are blood pressure readings? A blood pressure reading consists of a higher number over a lower number. Ideally, your blood pressure should be below 120/80. The first ("top") number is called the systolic pressure. It is a measure of the pressure in your arteries as your heart beats. The second ("bottom") number is called the diastolic pressure. It is a measure of the pressure in your arteries as the heart relaxes. What does my blood pressure reading mean? Blood pressure is classified into four stages. Based on your blood pressure reading, your health care provider may use the following stages to determine what type of treatment you need, if any. Systolic pressure and diastolic pressure are measured in a unit called mm Hg. Normal  Systolic pressure: below 120.  Diastolic pressure: below 80. Elevated  Systolic pressure: 120-129.  Diastolic pressure: below 80. Hypertension stage 1  Systolic pressure: 130-139.  Diastolic pressure: 80-89. Hypertension stage 2  Systolic pressure: 140 or above.  Diastolic pressure: 90 or above. What health risks are associated with hypertension? Managing your hypertension is an important responsibility. Uncontrolled hypertension can lead to:  A heart attack.  A stroke.  A weakened blood vessel (aneurysm).  Heart failure.  Kidney damage.  Eye damage.  Metabolic syndrome.  Memory and concentration problems. What changes can I make to manage my  hypertension? Hypertension can be managed by making lifestyle changes and possibly by taking medicines. Your health care provider will help you make a plan to bring your blood pressure within a normal range. Eating and drinking   Eat a diet that is high in fiber and potassium, and low in salt (sodium), added sugar, and fat. An example eating plan is called the DASH (Dietary Approaches to Stop Hypertension) diet. To eat this way: ? Eat plenty of fresh fruits and vegetables. Try to fill half of your plate at each meal with fruits and vegetables. ? Eat whole grains, such as whole wheat pasta, brown rice, or whole grain bread. Fill about one quarter of your plate with whole grains. ? Eat low-fat diary products. ? Avoid fatty cuts of meat, processed or cured meats, and poultry with skin. Fill about one quarter of your plate with lean proteins such as fish, chicken without skin, beans, eggs, and tofu. ? Avoid premade and processed foods. These tend to be higher in sodium, added sugar, and fat.  Reduce your daily sodium intake. Most people with hypertension should eat less than 1,500 mg of sodium a day.  Limit alcohol intake to no more than 1 drink a day for nonpregnant women and 2 drinks a day for men. One drink equals 12 oz of beer, 5 oz of wine, or 1 oz of hard liquor. Lifestyle  Work with your health care provider to maintain a healthy body weight, or to lose weight. Ask what an ideal weight is for you.  Get at least 30 minutes of exercise that causes your heart to beat faster (aerobic exercise) most days of the week. Activities may include walking, swimming, or biking.    Include exercise to strengthen your muscles (resistance exercise), such as weight lifting, as part of your weekly exercise routine. Try to do these types of exercises for 30 minutes at least 3 days a week.  Do not use any products that contain nicotine or tobacco, such as cigarettes and e-cigarettes. If you need help quitting,  ask your health care provider.  Control any long-term (chronic) conditions you have, such as high cholesterol or diabetes. Monitoring  Monitor your blood pressure at home as told by your health care provider. Your personal target blood pressure may vary depending on your medical conditions, your age, and other factors.  Have your blood pressure checked regularly, as often as told by your health care provider. Working with your health care provider  Review all the medicines you take with your health care provider because there may be side effects or interactions.  Talk with your health care provider about your diet, exercise habits, and other lifestyle factors that may be contributing to hypertension.  Visit your health care provider regularly. Your health care provider can help you create and adjust your plan for managing hypertension. Will I need medicine to control my blood pressure? Your health care provider may prescribe medicine if lifestyle changes are not enough to get your blood pressure under control, and if:  Your systolic blood pressure is 130 or higher.  Your diastolic blood pressure is 80 or higher. Take medicines only as told by your health care provider. Follow the directions carefully. Blood pressure medicines must be taken as prescribed. The medicine does not work as well when you skip doses. Skipping doses also puts you at risk for problems. Contact a health care provider if:  You think you are having a reaction to medicines you have taken.  You have repeated (recurrent) headaches.  You feel dizzy.  You have swelling in your ankles.  You have trouble with your vision. Get help right away if:  You develop a severe headache or confusion.  You have unusual weakness or numbness, or you feel faint.  You have severe pain in your chest or abdomen.  You vomit repeatedly.  You have trouble breathing. Summary  Hypertension is when the force of blood pumping  through your arteries is too strong. If this condition is not controlled, it may put you at risk for serious complications.  Your personal target blood pressure may vary depending on your medical conditions, your age, and other factors. For most people, a normal blood pressure is less than 120/80.  Hypertension is managed by lifestyle changes, medicines, or both. Lifestyle changes include weight loss, eating a healthy, low-sodium diet, exercising more, and limiting alcohol. This information is not intended to replace advice given to you by your health care provider. Make sure you discuss any questions you have with your health care provider. Document Revised: 06/11/2018 Document Reviewed: 01/16/2016 Elsevier Patient Education  2020 [employer]  

## 2019-10-25 NOTE — Progress Notes (Signed)
Name: Lisa Steele   MRN: [id number]    DOB: 22-Jan-1946   Date:10/26/2019       Progress Note  Subjective  Chief Complaint  Chief Complaint  Patient presents with  . Follow-up  . Hypertension    HPI  Obesity: she had hip surgery and has not been as active , gained weight but will resume as tolerated   HTN: taking bp medication daily. She denies chest pain , SOB or palpitation.Denies dizziness.No side effects of medication , needs refills   Hyperlipidemia: taking Crestor, no myalgias, compliant with medications.Last LDL 82 showed improvement, we will recheck labs today   Elevated glucose: family history of DM, previous hyperglycemia on labs, last hgbA1C was4.9%,but elevated fasting insulinand fasting glucose also slightly, she would like to have repeat labs . Denies polyphagia, polydipsia or polyuria   Ear pain: she has noticed ear fullness and is worried about an infection, no fever or chills, she states painful in left side only, he had ear lavage in the past but caused vertigo, no cold symptoms .   History of right hip replacement : 06/2019, on follow up noticed to have heterotopic ossification, right hip and has been   taking Indomethacin bid to slow progression , discussed risk of long risk use of nsaid's  Patient Active Problem List   Diagnosis Date Noted  . Follow up 10/25/2019  . Primary localized osteoarthritis 06/18/2019  . Status post total hip replacement, right 06/16/2019  . Trigger finger of left thumb 03/04/2018  . Left hand pain 02/03/2018  . S/P rotator cuff repair 01/12/2018  . Injury of left rotator cuff 11/03/2017  . Obesity (BMI 30.0-34.9) 02/19/2017  . Primary osteoarthritis of both knees 06/22/2014  . Obesity (BMI 30-39.9) 06/22/2014  . Dysmetabolic syndrome 06/22/2014  . Post-menopausal 06/22/2014  . H/O cold sores 06/22/2014  . Dyslipidemia 06/22/2014  . Elevated fasting blood sugar 06/22/2014  . Benign essential HTN 06/22/2014  .  Abnormal ECG 06/22/2014    Past Surgical History:  Procedure Laterality Date  . APPENDECTOMY    . BREAST EXCISIONAL BIOPSY Left 1970   neg  . BREAST SURGERY Left    cyst removed  . BUNIONECTOMY    . CATARACT EXTRACTION, BILATERAL  10/2013  . COLONOSCOPY WITH PROPOFOL N/A 06/25/2016   Procedure: COLONOSCOPY WITH PROPOFOL;  Surgeon: Scot Jun, MD;  Location: [facility] ENDOSCOPY;  Service: Endoscopy;  Laterality: N/A;  . ECTOPIC PREGNANCY SURGERY    . EYE SURGERY    . SHOULDER ARTHROSCOPY WITH ROTATOR CUFF REPAIR AND SUBACROMIAL DECOMPRESSION Left 11/25/2017   Procedure: SHOULDER ARTHROSCOPic release of long head biceps tendon and mini open rotator cuff repair;  Surgeon: Erin Sons, MD;  Location: [facility] ORS;  Service: Orthopedics;  Laterality: Left;  . TONSILECTOMY, ADENOIDECTOMY, BILATERAL MYRINGOTOMY AND TUBES    . TONSILLECTOMY    . 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   Procedure: TOTAL HIP ARTHROPLASTY ANTERIOR APPROACH;  Surgeon: Kennedy Bucker, MD;  Location: [facility] ORS;  Service: Orthopedics;  Laterality: Right;    Family History  Problem Relation Age of Onset  . Diabetes Mother   . Hypertension Mother   . Heart disease Mother   . Hyperlipidemia Mother   . Heart disease Father   . Diabetes Sister   . Heart disease Sister   . Hypertension Sister   . Hyperlipidemia Sister   . Breast cancer Sister 39  . Cancer Sister 23       breast  . Breast cancer Maternal Aunt   .  Diabetes Sister   . Stroke Sister   . Diabetes Sister   . Diabetes Sister     Social History   Tobacco Use  . Smoking status: Former Smoker    Years: 10.00    Types: Cigarettes    Start date: 03/03/1958  . Smokeless tobacco: Never Used  Substance Use Topics  . Alcohol use: Yes    Alcohol/week: 0.0 standard drinks    Comment: socially     Current Outpatient Medications:  .  aspirin EC 81 MG tablet, Take 81 mg by mouth daily. Swallow whole., Disp: , Rfl:  .  indomethacin (INDOCIN) 25 MG  capsule, Take 25 mg by mouth 2 (two) times daily., Disp: , Rfl:  .  olmesartan-hydrochlorothiazide (BENICAR HCT) 40-25 MG tablet, Take 1 tablet by mouth daily., Disp: 90 tablet, Rfl: 1 .  Polyethyl Glycol-Propyl Glycol (SYSTANE) 0.4-0.3 % SOLN, Place 1 drop into both eyes at bedtime., Disp: , Rfl:  .  rosuvastatin (CRESTOR) 10 MG tablet, TAKE 1 TABLET BY MOUTH  DAILY (Patient taking differently: Take 10 mg by mouth daily. ), Disp: 90 tablet, Rfl: 3 .  Docusate Sodium (DSS) 100 MG CAPS, Take by mouth. (Patient not taking: Reported on 10/26/2019), Disp: , Rfl:   Allergies  Allergen Reactions  . Tramadol Nausea And Vomiting  . Doxycycline Itching  . Penicillins Hives and Rash    leg lesions and weakness (couldn't walk)  Did it involve swelling of the face/tongue/throat, SOB, or low BP? No Did it involve sudden or severe rash/hives, skin peeling, or any reaction on the inside of your mouth or nose? No Did you need to seek medical attention at a hospital or doctor's office? Yes When did it last happen?38 years If all above answers are "NO", may proceed with cephalosporin use.    I personally reviewed active problem list, medication list, allergies, family history, social history with the patient/caregiver today.   ROS  Ten systems reviewed and is negative except as mentioned in HPI   Objective  Vitals:   10/26/19 1342  BP: 128/76  Pulse: 71  Resp: 16  Temp: 97.9 F (36.6 C)  TempSrc: Oral  SpO2: 99%  Weight: 203 lb 11.4 oz (92.4 kg)  Height: 5\' 3"  (1.6 m)    Body mass index is 36.09 kg/m.  Physical Exam  Constitutional: Patient appears well-developed and well-nourished. ObeseNo distress.  HEENT: head atraumatic, normocephalic, pupils equal and reactive to light, ears : left ear canal filled with cerumen,  neck supple, throat within normal limits Cardiovascular: Normal rate, regular rhythm and normal heart sounds.  No murmur heard. Trace BLE ankle   edema. Pulmonary/Chest: Effort normal and breath sounds normal. No respiratory distress. Abdominal: Soft.  There is no tenderness. Muscular skeletal: using cane to assist with gait Psychiatric: Patient has a normal mood and affect. behavior is normal. Judgment and thought content normal.  Recent Results (from the past 2160 hour(s))  POCT Urinalysis Dipstick     Status: Normal   Collection Time: 09/21/19  9:21 AM  Result Value Ref Range   Color, UA Yellow    Clarity, UA Clear    Glucose, UA Negative Negative   Bilirubin, UA Negative    Ketones, UA Negative    Spec Grav, UA 1.015 1.010 - 1.025   Blood, UA Negative    pH, UA 6.0 5.0 - 8.0   Protein, UA Negative Negative   Urobilinogen, UA 0.2 0.2 or 1.0 E.U./dL   Nitrite, UA Negative  Leukocytes, UA Negative Negative   Appearance Normal    Odor Normal     PHQ2/9: Depression screen [facility] 2/9 10/26/2019 09/21/2019 04/18/2019 03/15/2019 11/15/2018  Decreased Interest 0 0 0 0 0  Down, Depressed, Hopeless 0 0 0 0 0  PHQ - 2 Score 0 0 0 0 0  Altered sleeping 0 0 0 - 0  Tired, decreased energy 0 0 0 - 0  Change in appetite 0 0 0 - 0  Feeling bad or failure about yourself  0 0 0 - 0  Trouble concentrating 0 0 0 - 0  Moving slowly or fidgety/restless 0 0 0 - 0  Suicidal thoughts 0 0 0 - 0  PHQ-9 Score 0 0 0 - 0  Difficult doing work/chores - Not difficult at all Not difficult at all - -    phq 9 is negative   Fall Risk: Fall Risk  10/26/2019 09/21/2019 04/18/2019 03/15/2019 11/15/2018  Falls in the past year? 0 0 0 0 0  Number falls in past yr: 0 0 0 0 0  Comment - - - - -  Injury with Fall? 0 0 0 0 0  Comment - - - - -  Risk for fall due to : - - - Orthopedic patient -  Follow up - - - Falls prevention discussed -     Functional Status Survey: Is the patient deaf or have difficulty hearing?: No Does the patient have difficulty seeing, even when wearing glasses/contacts?: No Does the patient have difficulty concentrating,  remembering, or making decisions?: No Does the patient have difficulty walking or climbing stairs?: No Does the patient have difficulty dressing or bathing?: No Does the patient have difficulty doing errands alone such as visiting a doctor's office or shopping?: No    Assessment & Plan   1. Obesity (BMI 30-39.9)  Gained weight secondary to recent surgery and inability to exercise   2. Dyslipidemia  - Lipid panel  3. Benign essential HTN  At goal  - COMPLETE METABOLIC PANEL WITH GFR - CBC with Differential/Platelet - olmesartan-hydrochlorothiazide (BENICAR HCT) 40-25 MG tablet; Take 1 tablet by mouth daily.  Dispense: 90 tablet; Refill: 1  4. Anemia, unspecified type   Likely secondary to surgical bleed, we will recheck levels - CBC with Differential/Platelet - Iron, TIBC and Ferritin Panel  5. Status post total hip replacement, right  Still under the care of surgeon, Dr. Rosita Kea  6. Elevated fasting blood sugar  - Hemoglobin A1c   7. Cerumen in auditory canal on examination  - Ambulatory referral to ENT

## 2019-10-26 ENCOUNTER — Ambulatory Visit (INDEPENDENT_AMBULATORY_CARE_PROVIDER_SITE_OTHER): Payer: Medicare Other | Admitting: Family Medicine

## 2019-10-26 ENCOUNTER — Other Ambulatory Visit: Payer: Self-pay

## 2019-10-26 ENCOUNTER — Encounter: Payer: Self-pay | Admitting: Family Medicine

## 2019-10-26 VITALS — BP 128/76 | HR 71 | Temp 97.9°F | Resp 16 | Ht 63.0 in | Wt 203.7 lb

## 2019-10-26 DIAGNOSIS — R7301 Impaired fasting glucose: Secondary | ICD-10-CM

## 2019-10-26 DIAGNOSIS — Z96641 Presence of right artificial hip joint: Secondary | ICD-10-CM

## 2019-10-26 DIAGNOSIS — I10 Essential (primary) hypertension: Secondary | ICD-10-CM | POA: Diagnosis not present

## 2019-10-26 DIAGNOSIS — E669 Obesity, unspecified: Secondary | ICD-10-CM | POA: Diagnosis not present

## 2019-10-26 DIAGNOSIS — E785 Hyperlipidemia, unspecified: Secondary | ICD-10-CM | POA: Diagnosis not present

## 2019-10-26 DIAGNOSIS — H6120 Impacted cerumen, unspecified ear: Secondary | ICD-10-CM

## 2019-10-26 DIAGNOSIS — D649 Anemia, unspecified: Secondary | ICD-10-CM

## 2019-10-26 MED ORDER — OLMESARTAN MEDOXOMIL-HCTZ 40-25 MG PO TABS: 1.0000 | ORAL_TABLET | Freq: Every day | ORAL | 1 refills | Status: DC

## 2019-10-27 LAB — IRON,TIBC AND FERRITIN PANEL
%SAT: 20 % (calc) (ref 16–45)
Ferritin: 92 ng/mL (ref 16–288)
Iron: 73 ug/dL (ref 45–160)
TIBC: 362 mcg/dL (calc) (ref 250–450)

## 2019-10-27 LAB — COMPLETE METABOLIC PANEL WITH GFR
AG Ratio: 1.7 (calc) (ref 1.0–2.5)
ALT: 18 U/L (ref 6–29)
AST: 20 U/L (ref 10–35)
Albumin: 4.7 g/dL (ref 3.6–5.1)
Alkaline phosphatase (APISO): 98 U/L (ref 37–153)
BUN: 16 mg/dL (ref 7–25)
CO2: 32 mmol/L (ref 20–32)
Calcium: 10.1 mg/dL (ref 8.6–10.4)
Chloride: 100 mmol/L (ref 98–110)
Creat: 0.85 mg/dL (ref 0.60–0.93)
GFR, Est African American: 79 mL/min/{1.73_m2} (ref >=60)
GFR, Est Non African American: 68 mL/min/{1.73_m2} (ref >=60)
Globulin: 2.7 g/dL (calc) (ref 1.9–3.7)
Glucose, Bld: 122 mg/dL — ABNORMAL HIGH (ref 65–99)
Potassium: 4.9 mmol/L (ref 3.5–5.3)
Sodium: 140 mmol/L (ref 135–146)
Total Bilirubin: 0.5 mg/dL (ref 0.2–1.2)
Total Protein: 7.4 g/dL (ref 6.1–8.1)

## 2019-10-27 LAB — HEMOGLOBIN A1C
Hgb A1c MFr Bld: 5 % of total Hgb (ref <5.7)
Mean Plasma Glucose: 97 (calc)
eAG (mmol/L): 5.4 (calc)

## 2019-10-27 LAB — CBC WITH DIFFERENTIAL/PLATELET
Absolute Monocytes: 583 cells/uL (ref 200–950)
Basophils Absolute: 49 cells/uL (ref 0–200)
Basophils Relative: 1 %
Eosinophils Absolute: 69 cells/uL (ref 15–500)
Eosinophils Relative: 1.4 %
HCT: 44.1 % (ref 35.0–45.0)
Hemoglobin: 13.9 g/dL (ref 11.7–15.5)
Lymphs Abs: 2347 cells/uL (ref 850–3900)
MCH: 26 pg — ABNORMAL LOW (ref 27.0–33.0)
MCHC: 31.5 g/dL — ABNORMAL LOW (ref 32.0–36.0)
MCV: 82.4 fL (ref 80.0–100.0)
MPV: 11.3 fL (ref 7.5–12.5)
Monocytes Relative: 11.9 %
Neutro Abs: 1852 cells/uL (ref 1500–7800)
Neutrophils Relative %: 37.8 %
Platelets: 359 10*3/uL (ref 140–400)
RBC: 5.35 10*6/uL — ABNORMAL HIGH (ref 3.80–5.10)
RDW: 15.8 % — ABNORMAL HIGH (ref 11.0–15.0)
Total Lymphocyte: 47.9 %
WBC: 4.9 10*3/uL (ref 3.8–10.8)

## 2019-10-27 LAB — LIPID PANEL
Cholesterol: 179 mg/dL (ref <200)
HDL: 68 mg/dL (ref >=50)
LDL Cholesterol (Calc): 89 mg/dL (calc)
Non-HDL Cholesterol (Calc): 111 mg/dL (calc) (ref <130)
Total CHOL/HDL Ratio: 2.6 (calc) (ref <5.0)
Triglycerides: 121 mg/dL (ref <150)

## 2019-10-28 DIAGNOSIS — Z23 Encounter for immunization: Secondary | ICD-10-CM | POA: Diagnosis not present

## 2019-11-03 DIAGNOSIS — H6062 Unspecified chronic otitis externa, left ear: Secondary | ICD-10-CM | POA: Diagnosis not present

## 2019-11-03 DIAGNOSIS — H6122 Impacted cerumen, left ear: Secondary | ICD-10-CM | POA: Diagnosis not present

## 2019-11-21 DIAGNOSIS — Z96641 Presence of right artificial hip joint: Secondary | ICD-10-CM | POA: Diagnosis not present

## 2019-11-21 DIAGNOSIS — M160 Bilateral primary osteoarthritis of hip: Secondary | ICD-10-CM | POA: Diagnosis not present

## 2019-11-28 DIAGNOSIS — M25651 Stiffness of right hip, not elsewhere classified: Secondary | ICD-10-CM | POA: Diagnosis not present

## 2019-12-02 ENCOUNTER — Ambulatory Visit: Payer: Self-pay

## 2019-12-02 NOTE — Chronic Care Management (AMB) (Signed)
  Chronic Care Management   Outreach Note  12/02/2019 Name: Anistyn Graddy MRN: [id number] DOB: 12/07/1945  Primary Care Provider: [name], MD Reason for referral : Chronic Care Management    Ms. Labarge was referred to the care management team for assistance with chronic care management and care coordination. Her primary care provider will be notified of our unsuccessful attempts to establish and maintain contact. The care management team will gladly outreach at any time in the future if she is interested in receiving assistance.   PLAN The care management team will gladly follow up with Ms. Pfeifer after the primary care provider has a conversation with her regarding recommendation for care management engagement and subsequent re-referral for care management services.    [facility] Care Management [facility] [phone]

## 2019-12-06 DIAGNOSIS — M25651 Stiffness of right hip, not elsewhere classified: Secondary | ICD-10-CM | POA: Diagnosis not present

## 2019-12-08 DIAGNOSIS — M25651 Stiffness of right hip, not elsewhere classified: Secondary | ICD-10-CM | POA: Diagnosis not present

## 2019-12-12 ENCOUNTER — Ambulatory Visit: Payer: Medicare Other | Attending: Internal Medicine

## 2019-12-12 DIAGNOSIS — Z23 Encounter for immunization: Secondary | ICD-10-CM

## 2019-12-12 NOTE — Progress Notes (Signed)
   Covid-19 Vaccination Clinic  Name:  Seriah Brotzman    MRN: [id number] DOB: September 27, 1945  12/12/2019  Ms. Holzman was observed post Covid-19 immunization for 15 minutes without incident. She was provided with Vaccine Information Sheet and instruction to access the V-Safe system.   Ms. Eppolito was instructed to call 911 with any severe reactions post vaccine: Marland Kitchen Difficulty breathing  . Swelling of face and throat  . A fast heartbeat  . A bad rash all over body  . Dizziness and weakness

## 2019-12-15 DIAGNOSIS — M25651 Stiffness of right hip, not elsewhere classified: Secondary | ICD-10-CM | POA: Diagnosis not present

## 2019-12-20 DIAGNOSIS — M25651 Stiffness of right hip, not elsewhere classified: Secondary | ICD-10-CM | POA: Diagnosis not present

## 2019-12-22 DIAGNOSIS — M25651 Stiffness of right hip, not elsewhere classified: Secondary | ICD-10-CM | POA: Diagnosis not present

## 2019-12-27 DIAGNOSIS — M25651 Stiffness of right hip, not elsewhere classified: Secondary | ICD-10-CM | POA: Diagnosis not present

## 2019-12-29 DIAGNOSIS — M25651 Stiffness of right hip, not elsewhere classified: Secondary | ICD-10-CM | POA: Diagnosis not present

## 2020-01-03 DIAGNOSIS — M25651 Stiffness of right hip, not elsewhere classified: Secondary | ICD-10-CM | POA: Diagnosis not present

## 2020-01-05 DIAGNOSIS — M25651 Stiffness of right hip, not elsewhere classified: Secondary | ICD-10-CM | POA: Diagnosis not present

## 2020-01-10 DIAGNOSIS — M25651 Stiffness of right hip, not elsewhere classified: Secondary | ICD-10-CM | POA: Diagnosis not present

## 2020-01-12 DIAGNOSIS — M25651 Stiffness of right hip, not elsewhere classified: Secondary | ICD-10-CM | POA: Diagnosis not present

## 2020-01-17 DIAGNOSIS — M25651 Stiffness of right hip, not elsewhere classified: Secondary | ICD-10-CM | POA: Diagnosis not present

## 2020-01-19 DIAGNOSIS — M25651 Stiffness of right hip, not elsewhere classified: Secondary | ICD-10-CM | POA: Diagnosis not present

## 2020-01-24 DIAGNOSIS — M25651 Stiffness of right hip, not elsewhere classified: Secondary | ICD-10-CM | POA: Diagnosis not present

## 2020-01-27 ENCOUNTER — Ambulatory Visit: Payer: Medicare Other

## 2020-01-31 DIAGNOSIS — M25651 Stiffness of right hip, not elsewhere classified: Secondary | ICD-10-CM | POA: Diagnosis not present

## 2020-02-02 DIAGNOSIS — M25651 Stiffness of right hip, not elsewhere classified: Secondary | ICD-10-CM | POA: Diagnosis not present

## 2020-02-07 DIAGNOSIS — M25651 Stiffness of right hip, not elsewhere classified: Secondary | ICD-10-CM | POA: Diagnosis not present

## 2020-02-09 ENCOUNTER — Other Ambulatory Visit: Payer: Self-pay | Admitting: Family Medicine

## 2020-02-09 DIAGNOSIS — M25651 Stiffness of right hip, not elsewhere classified: Secondary | ICD-10-CM | POA: Diagnosis not present

## 2020-02-09 DIAGNOSIS — E785 Hyperlipidemia, unspecified: Secondary | ICD-10-CM

## 2020-02-14 DIAGNOSIS — M25651 Stiffness of right hip, not elsewhere classified: Secondary | ICD-10-CM | POA: Diagnosis not present

## 2020-02-16 DIAGNOSIS — M25651 Stiffness of right hip, not elsewhere classified: Secondary | ICD-10-CM | POA: Diagnosis not present

## 2020-02-27 DIAGNOSIS — H35372 Puckering of macula, left eye: Secondary | ICD-10-CM | POA: Diagnosis not present

## 2020-02-27 DIAGNOSIS — H40013 Open angle with borderline findings, low risk, bilateral: Secondary | ICD-10-CM | POA: Diagnosis not present

## 2020-02-28 DIAGNOSIS — M25651 Stiffness of right hip, not elsewhere classified: Secondary | ICD-10-CM | POA: Diagnosis not present

## 2020-03-06 DIAGNOSIS — M25651 Stiffness of right hip, not elsewhere classified: Secondary | ICD-10-CM | POA: Diagnosis not present

## 2020-03-08 DIAGNOSIS — M25651 Stiffness of right hip, not elsewhere classified: Secondary | ICD-10-CM | POA: Diagnosis not present

## 2020-03-13 DIAGNOSIS — M25651 Stiffness of right hip, not elsewhere classified: Secondary | ICD-10-CM | POA: Diagnosis not present

## 2020-03-15 DIAGNOSIS — M25651 Stiffness of right hip, not elsewhere classified: Secondary | ICD-10-CM | POA: Diagnosis not present

## 2020-03-20 ENCOUNTER — Ambulatory Visit (INDEPENDENT_AMBULATORY_CARE_PROVIDER_SITE_OTHER): Payer: Medicare Other

## 2020-03-20 DIAGNOSIS — Z0000 Encounter for general adult medical examination without abnormal findings: Secondary | ICD-10-CM

## 2020-03-20 NOTE — Progress Notes (Signed)
Subjective:   Lisa Steele is a 75 y.o. female who presents for Medicare Annual (Subsequent) preventive examination.  Virtual Visit via Telephone Note  I connected with  Lisa Steele on 03/20/20 at  9:20 AM EST by telephone and verified that I am speaking with the correct person using two identifiers.  Location: Patient: home Provider: [facility] Persons participating in the virtual visit: patient/Nurse Health Advisor   I discussed the limitations, risks, security and privacy concerns of performing an evaluation and management service by telephone and the availability of in person appointments. The patient expressed understanding and agreed to proceed.  Interactive audio and video telecommunications were attempted between this nurse and patient, however failed, due to patient having technical difficulties OR patient did not have access to video capability.  We continued and completed visit with audio only.  Some vital signs may be absent or patient reported.   Reather Littler, LPN    Review of Systems     Cardiac Risk Factors include: advanced age (>82men, >107 women);dyslipidemia;hypertension;obesity (BMI >30kg/m2)     Objective:    There were no vitals filed for this visit. There is no height or weight on file to calculate BMI.  Advanced Directives 03/20/2020 06/16/2019 06/16/2019 06/07/2019 06/07/2019 03/15/2019 03/09/2018  Does Patient Have a Medical Advance Directive? Yes Yes Yes - No Yes Yes  Type of Estate agent of [place];Living will Healthcare Power of [place];Living will Healthcare Power of [address] - - Healthcare Power of [place];Living will Healthcare Power of [place];Living will  Does patient want to make changes to medical advance directive? - No - Patient declined No - Patient declined - - Yes (MAU/Ambulatory/Procedural Areas - Information given) -  Copy of Healthcare Power of Attorney in Chart? No - copy requested No - copy requested No - copy requested  - - No - copy requested No - copy requested  Would patient like information on creating a medical advance directive? - No - Patient declined No - Patient declined No - Patient declined No - Patient declined - -    Current Medications (verified) Outpatient Encounter Medications as of 03/20/2020  Medication Sig  . aspirin EC 81 MG tablet Take 81 mg by mouth daily. Swallow whole.  . olmesartan-hydrochlorothiazide (BENICAR HCT) 40-25 MG tablet Take 1 tablet by mouth daily.  Bertram Gala Glycol-Propyl Glycol (SYSTANE) 0.4-0.3 % SOLN Place 1 drop into both eyes at bedtime.  . rosuvastatin (CRESTOR) 10 MG tablet TAKE 1 TABLET BY MOUTH  DAILY  . [DISCONTINUED] indomethacin (INDOCIN) 25 MG capsule Take 25 mg by mouth 2 (two) times daily.   No facility-administered encounter medications on file as of 03/20/2020.    Allergies (verified) Tramadol, Doxycycline, and Penicillins   History: Past Medical History:  Diagnosis Date  . Cataracts, bilateral   . Hyperlipidemia   . Hypertension   . Joint pain    both knees   Past Surgical History:  Procedure Laterality Date  . APPENDECTOMY    . BREAST EXCISIONAL BIOPSY Left 1970   neg  . BREAST SURGERY Left    cyst removed  . BUNIONECTOMY    . CATARACT EXTRACTION, BILATERAL  10/2013  . COLONOSCOPY WITH PROPOFOL N/A 06/25/2016   Procedure: COLONOSCOPY WITH PROPOFOL;  Surgeon: Scot Jun, MD;  Location: [facility] ENDOSCOPY;  Service: Endoscopy;  Laterality: N/A;  . ECTOPIC PREGNANCY SURGERY    . EYE SURGERY    . SHOULDER ARTHROSCOPY WITH ROTATOR CUFF REPAIR AND SUBACROMIAL DECOMPRESSION Left 11/25/2017   Procedure:  SHOULDER ARTHROSCOPic release of long head biceps tendon and mini open rotator cuff repair;  Surgeon: Erin Sons, MD;  Location: [facility] ORS;  Service: Orthopedics;  Laterality: Left;  . TONSILECTOMY, ADENOIDECTOMY, BILATERAL MYRINGOTOMY AND TUBES    . TONSILLECTOMY    . 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   Procedure: TOTAL HIP  ARTHROPLASTY ANTERIOR APPROACH;  Surgeon: Kennedy Bucker, MD;  Location: [facility] ORS;  Service: Orthopedics;  Laterality: Right;   Family History  Problem Relation Age of Onset  . Diabetes Mother   . Hypertension Mother   . Heart disease Mother   . Hyperlipidemia Mother   . Heart disease Father   . Diabetes Sister   . Heart disease Sister   . Hypertension Sister   . Hyperlipidemia Sister   . Breast cancer Sister 15  . Cancer Sister 50       breast  . Breast cancer Maternal Aunt   . Diabetes Sister   . Stroke Sister   . Diabetes Sister   . Diabetes Sister    Social History   Socioeconomic History  . Marital status: Married    Spouse name: [name]   . Number of children: 1  . Years of education: Not on file  . Highest education level: Associate degree: academic program  Occupational History  . Occupation: Theatre stage manager     Comment: [employer]   Tobacco Use  . Smoking status: Former Smoker    Years: 10.00    Types: Cigarettes    Start date: 03/03/1958  . Smokeless tobacco: Never Used  Vaping Use  . Vaping Use: Never used  Substance and Sexual Activity  . Alcohol use: Yes    Alcohol/week: 0.0 standard drinks    Comment: socially  . Drug use: No  . Sexual activity: Not Currently    Comment: husband has ED  Other Topics Concern  . Not on file  Social History Narrative   Married, son lives in [place] and has 3 grandchildren , youngest born 10/2016   Social Determinants of Health   Financial Resource Strain: Low Risk   . Difficulty of Paying Living Expenses: Not hard at all  Food Insecurity: No Food Insecurity  . Worried About Programme researcher, broadcasting/film/video in the Last Year: Never true  . Ran Out of Food in the Last Year: Never true  Transportation Needs: No Transportation Needs  . Lack of Transportation (Medical): No  . Lack of Transportation (Non-Medical): No  Physical Activity: Sufficiently Active  . Days of Exercise per Week: 3 days  . Minutes of  Exercise per Session: 60 min  Stress: No Stress Concern Present  . Feeling of Stress : Not at all  Social Connections: Socially Integrated  . Frequency of Communication with Friends and Family: More than three times a week  . Frequency of Social Gatherings with Friends and Family: More than three times a week  . Attends Religious Services: More than 4 times per year  . Active Member of Clubs or Organizations: Yes  . Attends Banker Meetings: More than 4 times per year  . Marital Status: Married    Tobacco Counseling Counseling given: Not Answered   Clinical Intake:  Pre-visit preparation completed: Yes  Pain : No/denies pain     Nutritional Risks: None Diabetes: No  How often do you need to have someone help you when you read instructions, pamphlets, or other written materials from your doctor or pharmacy?: 1 - Never    Interpreter  Needed?: No  Information entered by :: Reather Littler LPN   Activities of Daily Living In your present state of health, do you have any difficulty performing the following activities: 03/20/2020 10/26/2019  Hearing? N N  Comment declines hearing aids -  Vision? N N  Difficulty concentrating or making decisions? N N  Walking or climbing stairs? N N  Dressing or bathing? N N  Doing errands, shopping? N N  Preparing Food and eating ? N -  Using the Toilet? N -  In the past six months, have you accidently leaked urine? N -  Do you have problems with loss of bowel control? N -  Managing your Medications? N -  Managing your Finances? N -  Housekeeping or managing your Housekeeping? N -  Some recent data might be hidden    Patient Care Team: [name], MD as PCP - General (Family Medicine) Alwyn Pea, MD as Consulting Physician (Cardiology) Scot Jun, MD (Inactive) as Consulting Physician (Gastroenterology) Kennedy Bucker, MD as Consulting Physician (Orthopedic Surgery)  Indicate any recent Medical Services  you may have received from other than [facility] providers in the past year (date may be approximate).     Assessment:   This is a routine wellness examination for Lisa Steele.  Hearing/Vision screen  Hearing Screening   125Hz  250Hz  500Hz  1000Hz  2000Hz  3000Hz  4000Hz  6000Hz  8000Hz   Right ear:           Left ear:           Comments: Pt denies hearing difficulty  Vision Screening Comments: [facility] screen[facility]  Dietary issues and exercise activities discussed: Current Exercise Habits: Home exercise routine, Type of exercise: Other - see comments;stretching (recumbent bike), Time (Minutes): 60, Frequency (Times/Week): 3, Weekly Exercise (Minutes/Week): 180, Intensity: Moderate, Exercise limited by: None identified  Goals    . Weight (lb) < 200 lb (90.7 kg)     Pt would like to lose at least 10 lbs this year      Depression Screen PHQ 2/9 Scores 03/20/2020 10/26/2019 09/21/2019 04/18/2019 03/15/2019 11/15/2018 07/13/2018  PHQ - 2 Score 0 0 0 0 0 0 0  PHQ- 9 Score - 0 0 0 - 0 0    Fall Risk Fall Risk  03/20/2020 10/26/2019 09/21/2019 04/18/2019 03/15/2019  Falls in the past year? 0 0 0 0 0  Number falls in past yr: 0 0 0 0 0  Comment - - - - -  Injury with Fall? 0 0 0 0 0  Comment - - - - -  Risk for fall due to : No Fall Risks - - - Orthopedic patient  Follow up Falls prevention discussed - - - Falls prevention discussed    FALL RISK PREVENTION PERTAINING TO THE HOME:  Any stairs in or around the home? Yes  If so, are there any without handrails? No  Home free of loose throw rugs in walkways, pet beds, electrical cords, etc? Yes  Adequate lighting in your home to reduce risk of falls? Yes   ASSISTIVE DEVICES UTILIZED TO PREVENT FALLS:  Life alert? No  Use of a cane, walker or w/c? No  Grab bars in the bathroom? No  Shower chair or bench in shower? Yes  Elevated toilet seat or a handicapped toilet? Yes   TIMED UP AND GO:  Was the test performed? No .  telephonic visit  Cognitive Function: Normal cognitive status assessed by direct observation by this Nurse Health  Advisor. No abnormalities found.       6CIT Screen 03/09/2018  What Year? 0 points  What month? 0 points  What time? 0 points  Count back from 20 0 points  Months in reverse 0 points  Repeat phrase 0 points  Total Score 0    Immunizations Immunization History  Administered Date(s) Administered  . Fluad Quad(high Dose 65+) 11/15/2018  . Influenza, High Dose Seasonal PF 02/14/2016, 11/12/2016, 01/12/2018, 10/28/2019  . Influenza,inj,Quad PF,6+ Mos 02/10/2014, 01/29/2015  . Influenza-Unspecified 02/10/2014, 01/29/2015, 02/14/2016, 11/12/2016  . PFIZER(Purple Top)SARS-COV-2 Vaccination 03/25/2019, 04/15/2019, 12/12/2019  . Pneumococcal Conjugate-13 08/14/2014  . Pneumococcal Polysaccharide-23 01/27/2012  . Tdap 12/27/2010, 08/19/2017  . Zoster 01/01/2011  . Zoster Recombinat (Shingrix) 11/22/2018, 10/28/2019    TDAP status: Up to date  Flu Vaccine status: Up to date  Pneumococcal vaccine status: Up to date  Covid-19 vaccine status: Completed vaccines  Qualifies for Shingles Vaccine? Yes   Zostavax completed Yes   Shingrix Completed?: Yes  Screening Tests Health Maintenance  Topic Date Due  . COLONOSCOPY (Pts [phone] Insurance coverage will need to be confirmed)  06/25/2021  . MAMMOGRAM  09/11/2021  . TETANUS/TDAP  08/20/2027  . INFLUENZA VACCINE  Completed  . DEXA SCAN  Completed  . COVID-19 Vaccine  Completed  . Hepatitis C Screening  Completed  . PNA vac Low Risk Adult  Completed    Health Maintenance  There are no preventive care reminders to display for this patient.  Colorectal cancer screening: Type of screening: Colonoscopy. Completed 06/25/16. Repeat every 5 years  Mammogram status: Completed 09/12/19. Repeat every year  Bone Density status: Completed 09/28/17. Results reflect: Bone density results: NORMAL. Repeat every 2 years.  Lung  Cancer Screening: (Low Dose CT Chest recommended if Age 20-80 years, 30 pack-year currently smoking OR have quit w/in 15years.) does not qualify.   Additional Screening:  Hepatitis C Screening: does qualify; Completed 01/27/12  Vision Screening: Recommended annual ophthalmology exams for early detection of glaucoma and other disorders of the eye. Is the patient up to date with their annual eye exam?  Yes  Who is the provider or what is the name of the office in which the patient [facility]ends [facility] exams[facility]  Dental Screening: Recommended annual dental exams for proper oral hygiene  Community Resource Referral / Chronic Care Management: CRR required this visit?  No   CCM required this visit?  No      Plan:     I have personally reviewed and noted the following in the patient's chart:   . Medical and social history . Use of alcohol, tobacco or illicit drugs  . Current medications and supplements . Functional ability and status . Nutritional status . Physical activity . Advanced directives . List of other physicians . Hospitalizations, surgeries, and ER visits in previous 12 months . Vitals . Screenings to include cognitive, depression, and falls . Referrals and appointments  In addition, I have reviewed and discussed with patient certain preventive protocols, quality metrics, and best practice recommendations. A written personalized care plan for preventive services as well as general preventive health recommendations were provided to patient.     Reather LittlerKasey Ayush Boulet, LPN   1/61/09601/18/2022   Nurse Notes: pt doing well and appreciative of visit today.

## 2020-03-20 NOTE — Patient Instructions (Signed)
Lisa Steele , Thank you for taking time to come for your Medicare Wellness Visit. I appreciate your ongoing commitment to your health goals. Please review the following plan we discussed and let me know if I can assist you in the future.   Screening recommendations/referrals: Colonoscopy: done 06/25/16. Repeat in 2023 Mammogram: done 09/12/19 Bone Density: done 09/28/17 Recommended yearly ophthalmology/optometry visit for glaucoma screening and checkup Recommended yearly dental visit for hygiene and checkup  Vaccinations: Influenza vaccine: done 10/28/19 Pneumococcal vaccine: done 08/14/14 Tdap vaccine: done 08/19/17 Shingles vaccine: done 11/22/18 & 10/28/19   Covid-19:done 03/25/19, 04/15/19 & 12/12/19  Advanced directives: Please bring a copy of your health care power of attorney and living will to the office at your convenience.  Conditions/risks identified: Keep up the great work!  Next appointment: Follow up in one year for your annual wellness visit    Preventive Care 74 Years and Older, Female Preventive care refers to lifestyle choices and visits with your health care provider that can promote health and wellness. What does preventive care include?  A yearly physical exam. This is also called an annual well check.  Dental exams once or twice a year.  Routine eye exams. Ask your health care provider how often you should have your eyes checked.  Personal lifestyle choices, including:  Daily care of your teeth and gums.  Regular physical activity.  Eating a healthy diet.  Avoiding tobacco and drug use.  Limiting alcohol use.  Practicing safe sex.  Taking low-dose aspirin every day.  Taking vitamin and mineral supplements as recommended by your health care provider. What happens during an annual well check? The services and screenings done by your health care provider during your annual well check will depend on your age, overall health, lifestyle risk factors, and family  history of disease. Counseling  Your health care provider may ask you questions about your:  Alcohol use.  Tobacco use.  Drug use.  Emotional well-being.  Home and relationship well-being.  Sexual activity.  Eating habits.  History of falls.  Memory and ability to understand (cognition).  Work and work Astronomer.  Reproductive health. Screening  You may have the following tests or measurements:  Height, weight, and BMI.  Blood pressure.  Lipid and cholesterol levels. These may be checked every 5 years, or more frequently if you are over 74 years old.  Skin check.  Lung cancer screening. You may have this screening every year starting at age 20 if you have a 30-pack-year history of smoking and currently smoke or have quit within the past 15 years.  Fecal occult blood test (FOBT) of the stool. You may have this test every year starting at age 74.  Flexible sigmoidoscopy or colonoscopy. You may have a sigmoidoscopy every 5 years or a colonoscopy every 10 years starting at age 74.  Hepatitis C blood test.  Hepatitis B blood test.  Sexually transmitted disease (STD) testing.  Diabetes screening. This is done by checking your blood sugar (glucose) after you have not eaten for a while (fasting). You may have this done every 1-3 years.  Bone density scan. This is done to screen for osteoporosis. You may have this done starting at age 74.  Mammogram. This may be done every 1-2 years. Talk to your health care provider about how often you should have regular mammograms. Talk with your health care provider about your test results, treatment options, and if necessary, the need for more tests. Vaccines  Your health care  provider may recommend certain vaccines, such as:  Influenza vaccine. This is recommended every year.  Tetanus, diphtheria, and acellular pertussis (Tdap, Td) vaccine. You may need a Td booster every 10 years.  Zoster vaccine. You may need this after  age 26.  Pneumococcal 13-valent conjugate (PCV13) vaccine. One dose is recommended after age 74.  Pneumococcal polysaccharide (PPSV23) vaccine. One dose is recommended after age 74. Talk to your health care provider about which screenings and vaccines you need and how often you need them. This information is not intended to replace advice given to you by your health care provider. Make sure you discuss any questions you have with your health care provider. Document Released: 03/16/2015 Document Revised: 11/07/2015 Document Reviewed: 12/19/2014 Elsevier Interactive Patient Education  2017 [employer] Prevention in the Home Falls can cause injuries. They can happen to people of all ages. There are many things you can do to make your home safe and to help prevent falls. What can I do on the outside of my home?  Regularly fix the edges of walkways and driveways and fix any cracks.  Remove anything that might make you trip as you walk through a door, such as a raised step or threshold.  Trim any bushes or trees on the path to your home.  Use bright outdoor lighting.  Clear any walking paths of anything that might make someone trip, such as rocks or tools.  Regularly check to see if handrails are loose or broken. Make sure that both sides of any steps have handrails.  Any raised decks and porches should have guardrails on the edges.  Have any leaves, snow, or ice cleared regularly.  Use sand or salt on walking paths during winter.  Clean up any spills in your garage right away. This includes oil or grease spills. What can I do in the bathroom?  Use night lights.  Install grab bars by the toilet and in the tub and shower. Do not use towel bars as grab bars.  Use non-skid mats or decals in the tub or shower.  If you need to sit down in the shower, use a plastic, non-slip stool.  Keep the floor dry. Clean up any water that spills on the floor as soon as it  happens.  Remove soap buildup in the tub or shower regularly.  Attach bath mats securely with double-sided non-slip rug tape.  Do not have throw rugs and other things on the floor that can make you trip. What can I do in the bedroom?  Use night lights.  Make sure that you have a light by your bed that is easy to reach.  Do not use any sheets or blankets that are too big for your bed. They should not hang down onto the floor.  Have a firm chair that has side arms. You can use this for support while you get dressed.  Do not have throw rugs and other things on the floor that can make you trip. What can I do in the kitchen?  Clean up any spills right away.  Avoid walking on wet floors.  Keep items that you use a lot in easy-to-reach places.  If you need to reach something above you, use a strong step stool that has a grab bar.  Keep electrical cords out of the way.  Do not use floor polish or wax that makes floors slippery. If you must use wax, use non-skid floor wax.  Do not have throw  rugs and other things on the floor that can make you trip. What can I do with my stairs?  Do not leave any items on the stairs.  Make sure that there are handrails on both sides of the stairs and use them. Fix handrails that are broken or loose. Make sure that handrails are as long as the stairways.  Check any carpeting to make sure that it is firmly attached to the stairs. Fix any carpet that is loose or worn.  Avoid having throw rugs at the top or bottom of the stairs. If you do have throw rugs, attach them to the floor with carpet tape.  Make sure that you have a light switch at the top of the stairs and the bottom of the stairs. If you do not have them, ask someone to add them for you. What else can I do to help prevent falls?  Wear shoes that:  Do not have high heels.  Have rubber bottoms.  Are comfortable and fit you well.  Are closed at the toe. Do not wear sandals.  If you  use a stepladder:  Make sure that it is fully opened. Do not climb a closed stepladder.  Make sure that both sides of the stepladder are locked into place.  Ask someone to hold it for you, if possible.  Clearly mark and make sure that you can see:  Any grab bars or handrails.  First and last steps.  Where the edge of each step is.  Use tools that help you move around (mobility aids) if they are needed. These include:  Canes.  Walkers.  Scooters.  Crutches.  Turn on the lights when you go into a dark area. Replace any light bulbs as soon as they burn out.  Set up your furniture so you have a clear path. Avoid moving your furniture around.  If any of your floors are uneven, fix them.  If there are any pets around you, be aware of where they are.  Review your medicines with your doctor. Some medicines can make you feel dizzy. This can increase your chance of falling. Ask your doctor what other things that you can do to help prevent falls. This information is not intended to replace advice given to you by your health care provider. Make sure you discuss any questions you have with your health care provider. Document Released: 12/14/2008 Document Revised: 07/26/2015 Document Reviewed: 03/24/2014 Elsevier Interactive Patient Education  2017 [employer].

## 2020-03-22 DIAGNOSIS — M25651 Stiffness of right hip, not elsewhere classified: Secondary | ICD-10-CM | POA: Diagnosis not present

## 2020-03-27 DIAGNOSIS — M25651 Stiffness of right hip, not elsewhere classified: Secondary | ICD-10-CM | POA: Diagnosis not present

## 2020-03-29 DIAGNOSIS — M25651 Stiffness of right hip, not elsewhere classified: Secondary | ICD-10-CM | POA: Diagnosis not present

## 2020-04-03 DIAGNOSIS — M25651 Stiffness of right hip, not elsewhere classified: Secondary | ICD-10-CM | POA: Diagnosis not present

## 2020-04-05 DIAGNOSIS — M25651 Stiffness of right hip, not elsewhere classified: Secondary | ICD-10-CM | POA: Diagnosis not present

## 2020-04-16 ENCOUNTER — Other Ambulatory Visit: Payer: Self-pay | Admitting: Family Medicine

## 2020-04-16 DIAGNOSIS — I10 Essential (primary) hypertension: Secondary | ICD-10-CM

## 2020-04-25 NOTE — Progress Notes (Signed)
Name: Lisa Steele   MRN: [id number]    DOB: 09-17-1945   Date:04/27/2020       Progress Note  Subjective  Chief Complaint  Follow up   HPI   HTN: taking bp medication daily. She denies SOB , palpitation or  dizziness.No side effects of medication . Continue medications   Chest pain: she has noticed intermittent sub-sternal chest pain, described as a pinch sensation that lasts seconds but can happen in spells - 3-4 per episode. It happens 8-10 episodes per month and going on for about 5 months, not getting worse but she would like to see Dr. Juliann Pares since she has not seen in over 5 years . Usually at night, discussed it may be GERD and discussed life style modifications and try Pepcic AC at night   Hyperlipidemia: taking Crestor, no myalgias, compliant with medications.Last LDL 89.   Elevated glucose: family history of DM, previous hyperglycemia on labs, last hgbA1C was5%,but elevated fasting glucose.  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, right hip and took NSAID's for a while, had PT but still needs to use a cane when out of the house for stability , symptoms have improved , she only has pain when she first stand up but resolves with activity   Patient Active Problem List   Diagnosis Date Noted   Follow up 10/25/2019   Primary localized osteoarthritis 06/18/2019   Status post total hip replacement, right 06/16/2019   Trigger finger of left thumb 03/04/2018   Left hand pain 02/03/2018   S/P rotator cuff repair 01/12/2018   Injury of left rotator cuff 11/03/2017   Obesity (BMI 30.0-34.9) 02/19/2017   Primary osteoarthritis of both knees 06/22/2014   Obesity (BMI 30-39.9) 06/22/2014   Dysmetabolic syndrome 06/22/2014   Post-menopausal 06/22/2014    H/O cold sores 06/22/2014   Dyslipidemia 06/22/2014   Elevated fasting blood sugar 06/22/2014   Benign essential HTN 06/22/2014   Abnormal ECG 06/22/2014    Past Surgical History:  Procedure Laterality Date   APPENDECTOMY     BREAST EXCISIONAL BIOPSY Left 1970   neg   BREAST SURGERY Left    cyst removed   BUNIONECTOMY     CATARACT EXTRACTION, BILATERAL  10/2013   COLONOSCOPY WITH PROPOFOL N/A 06/25/2016   Procedure: COLONOSCOPY WITH PROPOFOL;  Surgeon: Scot Jun, MD;  Location[facility] ENDOSCOPY;  Service: Endoscopy;  Laterality: N/A;   ECTOPIC PREGNANCY SURGERY     EYE SURGERY     SHOULDER ARTHROSCOPY WITH ROTATOR CUFF REPAIR AND SUBACROMIAL DECOMPRESSION Left 11/25/2017   Procedure: SHOULDER ARTHROSCOPic release of long head biceps tendon and mini open rotator cuff repair;  Surgeon: Erin Sons, MD;  Location: [facility] ORS;  Service: Orthopedics;  Laterality: Left;   TONSILECTOMY, ADENOIDECTOMY, BILATERAL MYRINGOTOMY AND TUBES     TONSILLECTOMY     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   Procedure: TOTAL HIP ARTHROPLASTY ANTERIOR APPROACH;  Surgeon: Kennedy Bucker, MD;  Location: [facility] ORS;  Service: Orthopedics;  Laterality: Right;    Family History  Problem Relation Age of Onset   Diabetes Mother    Hypertension Mother    Heart disease Mother    Hyperlipidemia Mother    Heart disease Father    Diabetes Sister    Heart disease Sister    Hypertension Sister  Hyperlipidemia Sister    Breast cancer Sister 72   Cancer Sister 48       breast   Breast cancer Maternal Aunt    Diabetes Sister    Stroke Sister    Diabetes Sister    Diabetes Sister     Social History   Tobacco Use   Smoking status: Former Smoker    Years: 10.00    Types: Cigarettes    Start date: 03/03/1958   Smokeless tobacco: Never Used  Substance Use Topics   Alcohol use: Yes    Alcohol/week: 0.0 standard drinks    Comment: socially     Current  Outpatient Medications:    aspirin EC 81 MG tablet, Take 81 mg by mouth daily. Swallow whole., Disp: , Rfl:    olmesartan-hydrochlorothiazide (BENICAR HCT) 40-25 MG tablet, TAKE 1 TABLET BY MOUTH  DAILY, Disp: 90 tablet, Rfl: 0   Polyethyl Glycol-Propyl Glycol (SYSTANE) 0.4-0.3 % SOLN, Place 1 drop into both eyes at bedtime., Disp: , Rfl:    rosuvastatin (CRESTOR) 10 MG tablet, TAKE 1 TABLET BY MOUTH  DAILY, Disp: 90 tablet, Rfl: 2  Allergies  Allergen Reactions   Tramadol Nausea And Vomiting   Doxycycline Itching   Penicillins Hives and Rash    leg lesions and weakness (couldn't walk)  Did it involve swelling of the face/tongue/throat, SOB, or low BP? No Did it involve sudden or severe rash/hives, skin peeling, or any reaction on the inside of your mouth or nose? No Did you need to seek medical attention at a hospital or doctor's office? Yes When did it last happen?38 years If all above answers are NO, may proceed with cephalosporin use.    I personally reviewed active problem list, medication list, allergies, family history, social history, health maintenance with the patient/caregiver today.   ROS  Constitutional: Negative for fever or weight change.  Respiratory: Negative for cough and shortness of breath.   Cardiovascular: Negative for chest pain or palpitations.  Gastrointestinal: Negative for abdominal pain, no bowel changes.  Musculoskeletal: Positive  for gait problem or joint swelling.  Skin: Negative for rash.  Neurological: Negative for dizziness or headache.  No other specific complaints in a complete review of systems (except as listed in HPI above).  Objective  Vitals:   04/27/20 1102  BP: 128/74  Pulse: 85  Resp: 16  Temp: 97.7 F (36.5 C)  TempSrc: Oral  SpO2: 99%  Weight: 206 lb (93.4 kg)  Height: 5\' 3"  (1.6 m)    Body mass index is 36.49 kg/m.  Physical Exam  Constitutional: Patient appears well-developed and well-nourished.  Obese  No distress.  HEENT: head atraumatic, normocephalic, pupils equal and reactive to light, neck supple Cardiovascular: Normal rate, regular rhythm and normal heart sounds.  No murmur heard. No BLE edema. Pulmonary/Chest: Effort normal and breath sounds normal. No respiratory distress. Abdominal: Soft.  There is no tenderness. Muscular Skeletal: uses a cane, crepitus with extension of right knee  Psychiatric: Patient has a normal mood and affect. behavior is normal. Judgment and thought content normal.  PHQ2/9: Depression screen [facility] 2/9 04/27/2020 03/20/2020 10/26/2019 09/21/2019 04/18/2019  Decreased Interest 0 0 0 0 0  Down, Depressed, Hopeless 0 0 0 0 0  PHQ - 2 Score 0 0 0 0 0  Altered sleeping - - 0 0 0  Tired, decreased energy - - 0 0 0  Change in appetite - - 0 0 0  Feeling bad or failure about yourself  - -  0 0 0  Trouble concentrating - - 0 0 0  Moving slowly or fidgety/restless - - 0 0 0  Suicidal thoughts - - 0 0 0  PHQ-9 Score - - 0 0 0  Difficult doing work/chores - - - Not difficult at all Not difficult at all    phq 9 is negative   Fall Risk: Fall Risk  04/27/2020 03/20/2020 10/26/2019 09/21/2019 04/18/2019  Falls in the past year? 0 0 0 0 0  Number falls in past yr: 0 0 0 0 0  Comment - - - - -  Injury with Fall? 0 0 0 0 0  Comment - - - - -  Risk for fall due to : - No Fall Risks - - -  Follow up - Falls prevention discussed - - -     Functional Status Survey: Is the patient deaf or have difficulty hearing?: No Does the patient have difficulty seeing, even when wearing glasses/contacts?: No Does the patient have difficulty concentrating, remembering, or making decisions?: No Does the patient have difficulty walking or climbing stairs?: No Does the patient have difficulty dressing or bathing?: No Does the patient have difficulty doing errands alone such as visiting a doctor's office or shopping?: No    Assessment & Plan  1. Atypical chest pain  -  Ambulatory referral to Cardiology - aspirin EC 81 MG tablet; Take 1 tablet (81 mg total) by mouth daily. Swallow whole.  Dispense: 100 tablet; Refill: 3  2. Benign essential HTN  - olmesartan-hydrochlorothiazide (BENICAR HCT) 40-25 MG tablet; Take 1 tablet by mouth daily.  Dispense: 90 tablet; Refill: 1  3. Dyslipidemia  Continue medication   4. Morbid obesity (HCC)  Discussed with the patient the risk posed by an increased BMI. Discussed importance of portion control, calorie counting and at least 150 minutes of physical activity weekly. Avoid sweet beverages and drink more water. Eat at least 6 servings of fruit and vegetables daily   5. Ovarian failure  - DG Bone Density; Future  6. Primary osteoarthritis of both knees  stable

## 2020-04-27 ENCOUNTER — Encounter: Payer: Self-pay | Admitting: Family Medicine

## 2020-04-27 ENCOUNTER — Other Ambulatory Visit: Payer: Self-pay

## 2020-04-27 ENCOUNTER — Ambulatory Visit (INDEPENDENT_AMBULATORY_CARE_PROVIDER_SITE_OTHER): Payer: Medicare Other | Admitting: Family Medicine

## 2020-04-27 VITALS — BP 128/74 | HR 85 | Temp 97.7°F | Resp 16 | Ht 63.0 in | Wt 206.0 lb

## 2020-04-27 DIAGNOSIS — E2839 Other primary ovarian failure: Secondary | ICD-10-CM

## 2020-04-27 DIAGNOSIS — R0789 Other chest pain: Secondary | ICD-10-CM

## 2020-04-27 DIAGNOSIS — M170 Bilateral primary osteoarthritis of knee: Secondary | ICD-10-CM

## 2020-04-27 DIAGNOSIS — E785 Hyperlipidemia, unspecified: Secondary | ICD-10-CM

## 2020-04-27 DIAGNOSIS — I10 Essential (primary) hypertension: Secondary | ICD-10-CM

## 2020-04-27 MED ORDER — ASPIRIN EC 81 MG PO TBEC: 81.0000 mg | DELAYED_RELEASE_TABLET | Freq: Every day | ORAL | 3 refills | Status: DC

## 2020-04-27 MED ORDER — OLMESARTAN MEDOXOMIL-HCTZ 40-25 MG PO TABS: 1.0000 | ORAL_TABLET | Freq: Every day | ORAL | 1 refills | Status: DC

## 2020-04-27 MED ORDER — VITAMIN D 50 MCG (2000 UT) PO CAPS: 1.0000 | ORAL_CAPSULE | Freq: Every day | ORAL | 3 refills | Status: DC

## 2020-05-15 DIAGNOSIS — I440 Atrioventricular block, first degree: Secondary | ICD-10-CM | POA: Diagnosis not present

## 2020-05-15 DIAGNOSIS — I498 Other specified cardiac arrhythmias: Secondary | ICD-10-CM | POA: Diagnosis not present

## 2020-05-15 DIAGNOSIS — M169 Osteoarthritis of hip, unspecified: Secondary | ICD-10-CM | POA: Diagnosis not present

## 2020-05-15 DIAGNOSIS — K5900 Constipation, unspecified: Secondary | ICD-10-CM | POA: Diagnosis not present

## 2020-05-15 DIAGNOSIS — R0789 Other chest pain: Secondary | ICD-10-CM | POA: Diagnosis not present

## 2020-05-15 DIAGNOSIS — E7800 Pure hypercholesterolemia, unspecified: Secondary | ICD-10-CM | POA: Diagnosis not present

## 2020-05-15 DIAGNOSIS — I10 Essential (primary) hypertension: Secondary | ICD-10-CM | POA: Diagnosis not present

## 2020-05-15 DIAGNOSIS — I208 Other forms of angina pectoris: Secondary | ICD-10-CM | POA: Diagnosis not present

## 2020-05-15 DIAGNOSIS — E669 Obesity, unspecified: Secondary | ICD-10-CM | POA: Diagnosis not present

## 2020-05-22 DIAGNOSIS — H9201 Otalgia, right ear: Secondary | ICD-10-CM | POA: Diagnosis not present

## 2020-05-22 DIAGNOSIS — H60501 Unspecified acute noninfective otitis externa, right ear: Secondary | ICD-10-CM | POA: Diagnosis not present

## 2020-06-12 DIAGNOSIS — H6123 Impacted cerumen, bilateral: Secondary | ICD-10-CM | POA: Diagnosis not present

## 2020-06-12 DIAGNOSIS — H6061 Unspecified chronic otitis externa, right ear: Secondary | ICD-10-CM | POA: Diagnosis not present

## 2020-06-12 DIAGNOSIS — H9201 Otalgia, right ear: Secondary | ICD-10-CM | POA: Diagnosis not present

## 2020-06-21 DIAGNOSIS — Z01419 Encounter for gynecological examination (general) (routine) without abnormal findings: Secondary | ICD-10-CM | POA: Diagnosis not present

## 2020-06-21 DIAGNOSIS — Z124 Encounter for screening for malignant neoplasm of cervix: Secondary | ICD-10-CM | POA: Diagnosis not present

## 2020-07-31 ENCOUNTER — Other Ambulatory Visit: Payer: Self-pay | Admitting: Family Medicine

## 2020-07-31 DIAGNOSIS — Z1231 Encounter for screening mammogram for malignant neoplasm of breast: Secondary | ICD-10-CM

## 2020-07-31 DIAGNOSIS — I208 Other forms of angina pectoris: Secondary | ICD-10-CM | POA: Diagnosis not present

## 2020-08-01 DIAGNOSIS — Z96641 Presence of right artificial hip joint: Secondary | ICD-10-CM | POA: Diagnosis not present

## 2020-08-01 DIAGNOSIS — M160 Bilateral primary osteoarthritis of hip: Secondary | ICD-10-CM | POA: Diagnosis not present

## 2020-08-01 DIAGNOSIS — M6150 Other ossification of muscle, unspecified site: Secondary | ICD-10-CM | POA: Diagnosis not present

## 2020-08-01 DIAGNOSIS — M9689 Other intraoperative and postprocedural complications and disorders of the musculoskeletal system: Secondary | ICD-10-CM | POA: Diagnosis not present

## 2020-08-09 ENCOUNTER — Other Ambulatory Visit: Payer: Self-pay

## 2020-08-09 ENCOUNTER — Ambulatory Visit
Admission: RE | Admit: 2020-08-09 | Discharge: 2020-08-09 | Disposition: A | Discharge status: Home / Self Care | Payer: Medicare Other | Source: Ambulatory Visit | Attending: Radiation Oncology | Admitting: Radiation Oncology

## 2020-08-09 ENCOUNTER — Encounter: Payer: Self-pay | Admitting: Radiation Oncology

## 2020-08-09 VITALS — BP 148/56 | HR 72 | Temp 97.2°F | Resp 16 | Wt 203.5 lb

## 2020-08-09 DIAGNOSIS — M61551 Other ossification of muscle, right thigh: Secondary | ICD-10-CM | POA: Insufficient documentation

## 2020-08-09 DIAGNOSIS — E785 Hyperlipidemia, unspecified: Secondary | ICD-10-CM | POA: Insufficient documentation

## 2020-08-09 DIAGNOSIS — Z96641 Presence of right artificial hip joint: Secondary | ICD-10-CM | POA: Diagnosis not present

## 2020-08-09 DIAGNOSIS — Z803 Family history of malignant neoplasm of breast: Secondary | ICD-10-CM | POA: Insufficient documentation

## 2020-08-09 DIAGNOSIS — Z79899 Other long term (current) drug therapy: Secondary | ICD-10-CM | POA: Diagnosis not present

## 2020-08-09 DIAGNOSIS — M898X9 Other specified disorders of bone, unspecified site: Secondary | ICD-10-CM

## 2020-08-09 DIAGNOSIS — I10 Essential (primary) hypertension: Secondary | ICD-10-CM | POA: Insufficient documentation

## 2020-08-09 DIAGNOSIS — Z87891 Personal history of nicotine dependence: Secondary | ICD-10-CM | POA: Insufficient documentation

## 2020-08-09 DIAGNOSIS — Z7982 Long term (current) use of aspirin: Secondary | ICD-10-CM | POA: Insufficient documentation

## 2020-08-09 NOTE — Consult Note (Signed)
NEW PATIENT EVALUATION  Name: Lisa Steele  MRN: [id number]  Date:   08/09/2020     DOB: Dec 10, 1945   This 75 y.o. 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   PREVIOUS INVESTIGATIONS:  Previous x-rays reviewed Clinical notes reviewed  HPI: Patient is a 75 year old female status post right total hip arthroplasty in April 2021.  She had developed heterotopic ossification which is causing significant pain in the right hip.  Follow-up x-ray showed extensive heterotrophic ossification from the acetabulum that appears to be impinging on the greater trochanter anteriorly.  She is being scheduled for right hip revision and I have been asked to evaluate her for possible radiation therapy to prevent heterotopic ossification.  She is ambulating with assistance of a cane.  PLANNED TREATMENT REGIMEN: Single fraction radiation to prevent heterotropic ossification  PAST MEDICAL HISTORY:  has a past medical history of Cataracts, bilateral, Hyperlipidemia, Hypertension, and Joint pain.    PAST SURGICAL HISTORY:  Past Surgical History:  Procedure Laterality Date   APPENDECTOMY     BREAST EXCISIONAL BIOPSY Left 1970   neg   BREAST SURGERY Left    cyst removed   BUNIONECTOMY     CATARACT EXTRACTION, BILATERAL  10/2013   COLONOSCOPY WITH PROPOFOL N/A 06/25/2016   Procedure: COLONOSCOPY WITH PROPOFOL;  Surgeon: Scot Jun, MD;  Location: [facility] END[facility];  Service: Endoscopy;  Laterality: N/A;   ECTOPIC PREGNANCY SURGERY     EYE SURGERY     SHOULDER ARTHROSCOPY WITH ROTATOR CUFF REPAIR AND SUBACROMIAL DECOMPRESSION Left 11/25/2017   Procedure: SHOULDER ARTHROSCOPic release of long  head biceps tendon and mini open rotator cuff repair;  Surgeon: Erin Sons, MD;  Location: [facility] ORS;  Service: Orthopedics;  Laterality: Left;   TONSILECTOMY, ADENOIDECTOMY, BILATERAL MYRINGOTOMY AND TUBES     TONSILLECTOMY     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   Procedure: TOTAL HIP ARTHROPLASTY ANTERIOR APPROACH;  Surgeon: Kennedy Bucker, MD;  Location: [facility] ORS;  Service: Orthopedics;  Laterality: Right;    FAMILY HISTORY: family history includes Breast cancer in her maternal aunt; Breast cancer (age of onset: 22) in her sister; Cancer (age of onset: 77) in her sister; Diabetes in her mother, sister, sister, sister, and sister; Heart disease in her father, mother, and sister; Hyperlipidemia in her mother and sister; Hypertension in her mother and sister; Stroke in her sister.  SOCIAL HISTORY:  reports that she has quit smoking. Her smoking use included cigarettes. She started smoking about 62 years ago. She has never used smokeless tobacco. She reports current alcohol use. She reports that she does not use drugs.  ALLERGIES: Tramadol, Doxycycline, and Penicillins  MEDICATIONS:  Current Outpatient Medications  Medication Sig Dispense Refill   aspirin EC 81 MG tablet Take 1 tablet (81 mg total) by mouth daily. Swallow whole. 100 tablet 3   Cholecalciferol (VITAMIN D) 50 MCG (2000 UT) CAPS Take 1 capsule (2,000 Units total) by mouth daily. 100 capsule 3   olmesartan-hydrochlorothiazide (BENICAR HCT) 40-25 MG tablet Take 1 tablet by mouth daily. 90 tablet 1   Polyethyl Glycol-Propyl Glycol (SYSTANE) 0.4-0.3 % SOLN Place 1 drop into both eyes at bedtime.     rosuvastatin (CRESTOR)  10 MG tablet TAKE 1 TABLET BY MOUTH  DAILY 90 tablet 2   No current facility-administered medications for this encounter.    ECOG PERFORMANCE STATUS:  1 - Symptomatic but completely ambulatory  REVIEW OF SYSTEMS: Patient denies any weight loss, fatigue, weakness, fever, chills or night sweats. Patient  denies any loss of vision, blurred vision. Patient denies any ringing  of the ears or hearing loss. No irregular heartbeat. Patient denies heart murmur or history of fainting. Patient denies any chest pain or pain radiating to her upper extremities. Patient denies any shortness of breath, difficulty breathing at night, cough or hemoptysis. Patient denies any swelling in the lower legs. Patient denies any nausea vomiting, vomiting of blood, or coffee ground material in the vomitus. Patient denies any stomach pain. Patient states has had normal bowel movements no significant constipation or diarrhea. Patient denies any dysuria, hematuria or significant nocturia. Patient denies any problems walking, swelling in the joints or loss of balance. Patient denies any skin changes, loss of hair or loss of weight. Patient denies any excessive worrying or anxiety or significant depression. Patient denies any problems with insomnia. Patient denies excessive thirst, polyuria, polydipsia. Patient denies any swollen glands, patient denies easy bruising or easy bleeding. Patient denies any recent infections, allergies or URI. Patient "s visual fields have not changed significantly in recent time.   PHYSICAL EXAM: BP (!) 148/56 (BP Location: Left Arm, Patient Position: Sitting)   Pulse 72   Temp (!) 97.2 F (36.2 C) (Tympanic)   Resp 16   Wt 203 lb 8 oz (92.3 kg)   BMI 36.05 kg/m  Range of motion of the lower extremities does not elicit pain.  Motor or sensory and DTR levels are equal symmetric in the lower extremities.  Well-developed well-nourished patient in NAD. HEENT reveals PERLA, EOMI, discs not visualized.  Oral cavity is clear. No oral mucosal lesions are identified. Neck is clear without evidence of cervical or supraclavicular adenopathy. Lungs are clear to A&P. Cardiac examination is essentially unremarkable with regular rate and rhythm without murmur rub or thrill. Abdomen is benign with no organomegaly or  masses noted. Motor sensory and DTR levels are equal and symmetric in the upper and lower extremities. Cranial nerves II through XII are grossly intact. Proprioception is intact. No peripheral adenopathy or edema is identified. No motor or sensory levels are noted. Crude visual fields are within normal range.  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  Risks and benefits of treatment occluding possible skin reaction fatigue all were reviewed in detail with the patient.  Once she has her surgical date we will get her in here the week before for CT simulation and treatment planning and be prepared to treat her within 72 hours of her surgery.  Patient is aware of my recommendations.  We will coordinate with my nurses the timing of her surgery.  Patient is to call with any concerns.  I would like to take this opportunity to thank you for allowing me to participate in the care of your patient.Carmina Miller, MD

## 2020-08-13 ENCOUNTER — Other Ambulatory Visit: Payer: Self-pay | Admitting: Orthopedic Surgery

## 2020-08-17 ENCOUNTER — Encounter: Payer: Self-pay | Admitting: Orthopedic Surgery

## 2020-08-17 ENCOUNTER — Other Ambulatory Visit: Payer: Self-pay

## 2020-08-17 ENCOUNTER — Encounter
Admission: RE | Admit: 2020-08-17 | Discharge: 2020-08-17 | Disposition: A | Discharge status: Home / Self Care | Payer: Medicare Other | Source: Ambulatory Visit | Attending: Orthopedic Surgery | Admitting: Orthopedic Surgery

## 2020-08-17 HISTORY — DX: Unspecified osteoarthritis, unspecified site: M19.90

## 2020-08-17 HISTORY — DX: Atrioventricular block, first degree: I44.0

## 2020-08-17 HISTORY — DX: Obesity, unspecified: E66.9

## 2020-08-17 NOTE — Patient Instructions (Addendum)
Your procedure is scheduled on: Thursday, June 30 Report to the Registration Desk on the 1st floor of the [facility] To find out your arrival time, please call [phone] between 1PM - 3PM on:  Wednesday, June 29  REMEMBER: Instructions that are not followed completely may result in serious medical risk, up to and including death; or upon the discretion of your surgeon and anesthesiologist your surgery may need to be rescheduled.  Do not eat food after midnight the night before surgery.  No gum chewing, lozengers or hard candies.  You may however, drink CLEAR liquids up to 2 hours before you are scheduled to arrive for your surgery. Do not drink anything within 2 hours of your scheduled arrival time.  Clear liquids include: - water  - apple juice without pulp - gatorade (not RED, PURPLE, OR BLUE) - black coffee or tea (Do NOT add milk or creamers to the coffee or tea) Do NOT drink anything that is not on this list.  In addition, your doctor has ordered for you to drink the provided  Ensure Pre-Surgery Clear Carbohydrate Drink  Drinking this carbohydrate drink up to two hours before surgery helps to reduce insulin resistance and improve patient outcomes. Please complete drinking 2 hours prior to scheduled arrival time.  DO NOT TAKE ANY MEDICATIONS THE MORNING OF SURGERY   One week prior to surgery: STARTING June 23 Stop Aspirin and anti-inflammatories (NSAIDS) such as Advil, Aleve, Ibuprofen, Motrin, Naproxen, Naprosyn and Aspirin based products such as Excedrin, Goodys Powder, BC Powder. Stop ANY OVER THE COUNTER supplements until after surgery. (Vitamin D) You may however, continue to take Tylenol if needed for pain up until the day of surgery.  No Alcohol for 24 hours before or after surgery.  On the morning of surgery brush your teeth with toothpaste and water, you may rinse your mouth with mouthwash if you wish. Do not swallow any toothpaste or mouthwash.  Do not wear  jewelry, make-up, hairpins, clips or nail polish.  Do not wear lotions, powders, or perfumes.   Do not shave body from the neck down 48 hours prior to surgery just in case you cut yourself which could leave a site for infection.  Also, freshly shaved skin may become irritated if using the CHG soap.  Contact lenses, hearing aids and dentures may not be worn into surgery.  Do not bring valuables to the hospital. [facility] is not responsible for any missing/lost belongings or valuables.   Use CHG Soap as directed on instruction sheet.  Notify your doctor if there is any change in your medical condition (cold, fever, infection).  Wear comfortable clothing (specific to your surgery type) to the hospital.  After surgery, you can help prevent lung complications by doing breathing exercises.  Take deep breaths and cough every 1-2 hours. Your doctor may order a device called an Incentive Spirometer to help you take deep breaths.  If you are being admitted to the hospital overnight, leave your suitcase in the car. After surgery it may be brought to your room.  If you are being discharged the day of surgery, you will not be allowed to drive home. You will need a responsible adult (18 years or older) to drive you home and stay with you that night.   If you are taking public transportation, you will need to have a responsible adult (18 years or older) with you. Please confirm with your physician that it is acceptable to use public transportation.  Please call the Pre-admissions Testing Dept. at [phone] if you have any questions about these instructions.  Surgery Visitation Policy:  Patients undergoing a surgery or procedure may have one family member or support person with them as long as that person is not COVID-19 positive or experiencing its symptoms.  That person may remain in the waiting area during the procedure.  Inpatient Visitation:    Visiting hours are 7 a.m. to 8  p.m. Inpatients will be allowed two visitors daily. The visitors may change each day during the patient's stay. No visitors under the age of 14. Any visitor under the age of 22 must be accompanied by an adult. The visitor must pass COVID-19 screenings, use hand sanitizer when entering and exiting the patient's room and wear a mask at all times, including in the patient's room. Patients must also wear a mask when staff or their visitor are in the room. Masking is required regardless of vaccination status.

## 2020-08-21 ENCOUNTER — Encounter: Payer: Self-pay | Admitting: Orthopedic Surgery

## 2020-08-21 NOTE — Progress Notes (Signed)
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 Clinical nursing staff has updated patient's PMH/PSHx, current medication list, and drug allergies/intolerances to ensure comprehensive history available to assist in medical decision making as it pertains to the aforementioned surgical procedure and anticipated anesthetic course. Extensive review of available clinical information performed. [place] PMH and PSHx updated with any diagnoses/procedures that  may have been inadvertently omitted during her intake with the pre-admission testing department's nursing staff.  Clinical Discussion:  Lawana Hartzell is a 75 y.o. female who is submitted for pre-surgical anesthesia review and clearance prior to her undergoing the above procedure. Patient is a Former Games developer. Pertinent PMH includes: equivalent angina, fluttering heart, first-degree AV block, HTN, HLD, OA, DJD, postoperative heterotrophic ossification.  Patient seen in initial consult by cardiology Juliann Pares, MD) on 05/15/2020 for recurrent chest pain that been going on for a few months.  Patient described pain as a "pinching sensation" with episodes lasting approximately 5 minutes prior to the resolution without intervention.  She denied any associated shortness of breath.  No PND, orthopnea,  peripheral edema, vertiginous symptoms, or presyncope/syncope.  Patient with reports of occasional heart fluttering.  She has no significant cardiovascular history, however a  (+) family history of ASCVD. Myocardial perfusion imaging study in 07/2011 demonstrated no evidence of stress-induced myocardial ischemia or arrhythmia; LVEF at that time was 64%. Patient on GDMT for her HTN and HLD diagnoses.  Blood pressure moderately controlled at 140/80 on currently prescribed ARB and diuretic therapies.  Patient is on a statin for her HLD. ECG in the office revealed SR with 1st degree AVB at a rate of 82 bpm. Functional capacity, as defined by DASI, is documented as being >/= 4 METS.  No changes were made to patient's medication regimen. The decision was made to send patient for further non-invasive cardiovascular workup. Patient to follow-up with outpatient cardiology after studies to discuss results and ongoing management.  TTE performed on 07/31/2020 revealed globally normal systolic function with mild mitral and tricuspid valve regurgitation.  There was no evidence of valvular stenosis.  LVEF was 55%.  Patient status post total hip arthroplasty in 06/2019. Since her surgery, patient has developed heterotrophic ossification and is coming in for removal.  Additionally, patient was being seen by radiation oncology for potential XRT treatments to prevent recurrence.  Elective orthopedic procedure is scheduled for 08/30/2020 with Dr. Kennedy Bucker.  Given patient's complaints of recent chest pain and "heart fluttering", presurgical clearance was sought from attending cardiologist. Per cardiology, "this patient is optimized for surgery and may proceed with the planned procedural course with a LOW risk stratification". This patient is on daily antiplatelet therapy. She has been instructed on recommendations for holding her daily low-dose ASA for 5 days prior to her procedure with plans to restart 2 days postoperatively,  or as soon as  postoperative bleeding risk felt to be minimized by her attending surgeon. The patient has been instructed that her last dose of her anticoagulant will be on 08/24/2020.  Patient denies previous perioperative complications with anesthesia in the past. In review of the available records, it is noted that patient underwent a neuraxial anesthetic course here (ASA II) in 06/2019 without documented complications.   Vitals with BMI 08/17/2020 08/09/2020 04/27/2020  Height 5\' 3"  - 5\' 3"   Weight 200 lbs 203 lbs 8 oz 206 lbs  BMI 35.44 - 36.5  Systolic - 148 128  Diastolic - 56 74  Pulse - 72 85    Providers/Specialists:   NOTE: Primary physician provider listed below. Patient may have been seen by APP or partner within same practice.   PROVIDER ROLE / SPECIALTY LAST , MD  Orthopedics (Surgeon) 08/01/2020  Inge Rise, MD  Primary Care Provider 04/27/2020  [name], MD  Cardiology 05/15/2020  Rudean Hitt, MD  Radiation Oncology 08/09/2020   Allergies:  Tramadol, Doxycycline, and Penicillins  Current Home Medications:   No current facility-administered medications for this encounter.    aspirin EC 81 MG tablet   mometasone (ELOCON) 0.1 % lotion   olmesartan-hydrochlorothiazide (BENICAR HCT) 40-25 MG tablet   Polyethyl Glycol-Propyl Glycol (SYSTANE) 0.4-0.3 % SOLN   rosuvastatin (CRESTOR) 10 MG tablet   Cholecalciferol (VITAMIN D) 50 MCG (2000 UT) CAPS   History:   Past Medical History:  Diagnosis Date   1st degree AV block    Cataracts, bilateral    DJD (degenerative joint disease)    Equivalent angina (HCC)    Fluttering heart    Heterotopic ossification    RIGHT hip   Hyperlipidemia    Hypertension    Obesity    Osteoarthritis    Past Surgical History:  Procedure Laterality Date   APPENDECTOMY     BREAST EXCISIONAL BIOPSY Left 1970   neg   BREAST SURGERY Left    cyst removed   BUNIONECTOMY Left 2008   CATARACT EXTRACTION,  BILATERAL  10/2013   COLONOSCOPY     2005, 2009, 2013   COLONOSCOPY WITH PROPOFOL N/A 06/25/2016   Procedure: COLONOSCOPY WITH PROPOFOL;  Surgeon: 2014, MD;  Location: [facility] ENDOSCOPY;  Service: Endoscopy;  Laterality: N/A;   ECTOPIC PREGNANCY SURGERY  1978   EYE SURGERY     JOINT REPLACEMENT     SHOULDER ARTHROSCOPY WITH ROTATOR CUFF REPAIR AND SUBACROMIAL DECOMPRESSION Left 11/25/2017   Procedure: SHOULDER ARTHROSCOPic release of long head biceps tendon and mini open rotator cuff repair;  Surgeon: [facility], MD;  Location: [facility] ORS;  Service: Orthopedics;  Laterality: Left;   TONSILECTOMY, ADENOIDECTOMY, BILATERAL MYRINGOTOMY AND TUBES     TONSILLECTOMY     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   Procedure: TOTAL HIP ARTHROPLASTY ANTERIOR APPROACH;  Surgeon: Erin Sons, MD;  Location: [facility] ORS;  Service: Orthopedics;  Laterality: Right;   TUBAL LIGATION     Family History  Problem Relation Age of Onset   Diabetes Mother    Hypertension Mother    Heart disease Mother    Hyperlipidemia Mother    Heart disease Father    Diabetes Sister    Heart disease Sister    Hypertension Sister    Hyperlipidemia Sister    Breast cancer Sister 85   Cancer Sister 10       breast   Breast cancer Maternal Aunt    Diabetes Sister    Stroke Sister  Diabetes Sister    Diabetes Sister    Social History   Tobacco Use   Smoking status: Former    Years: 10.00    Pack years: 0.00    Types: Cigarettes    Start date: 03/03/1958   Smokeless tobacco: Never  Vaping Use   Vaping Use: Never used  Substance Use Topics   Alcohol use: Yes    Alcohol/week: 0.0 standard drinks    Comment: socially   Drug use: No    Pertinent Clinical Results:  LABS: Labs reviewed: Acceptable for surgery.  Hospital Outpatient Visit on 08/28/2020  Component Date Value Ref Range Status   Sodium 08/28/2020 138  135 - 145 mmol/L Final   Potassium 08/28/2020 3.6  3.5 - 5.1 mmol/L Final    Chloride 08/28/2020 103  98 - 111 mmol/L Final   CO2 08/28/2020 27  22 - 32 mmol/L Final   Glucose, Bld 08/28/2020 111 (A) 70 - 99 mg/dL Final   Glucose reference range applies only to samples taken after fasting for at least 8 hours.   BUN 08/28/2020 18  8 - 23 mg/dL Final   Creatinine, Ser 08/28/2020 0.83  0.44 - 1.00 mg/dL Final   Calcium 96/04/540906/28/2022 9.5  8.9 - 10.3 mg/dL Final   GFR, Estimated 08/28/2020 >60  >60 mL/min Final   Comment: (NOTE) Calculated using the CKD-EPI Creatinine Equation (2021)    Anion gap 08/28/2020 8  5 - 15 Final   Performed at [facility], [address] [place], [place] [address]   WBC 08/28/2020 5.2  4.0 - 10.5 K/uL Final   RBC 08/28/2020 5.26 (A) 3.87 - 5.11 MIL/uL Final   Hemoglobin 08/28/2020 14.7  12.0 - 15.0 g/dL Final   HCT 47/82/956206/28/2022 43.3  36.0 - 46.0 % Final   MCV 08/28/2020 82.3  80.0 - 100.0 fL Final   MCH 08/28/2020 27.9  26.0 - 34.0 pg Final   MCHC 08/28/2020 33.9  30.0 - 36.0 g/dL Final   RDW 13/08/657806/28/2022 15.2  11.5 - 15.5 % Final   Platelets 08/28/2020 326  150 - 400 K/uL Final   nRBC 08/28/2020 0.0  0.0 - 0.2 % Final   Performed at [facility], [address] [place], [place] [address]   SARS Coronavirus 2 08/28/2020 NEGATIVE  NEGATIVE Final   Comment: (NOTE) SARS-CoV-2 target nucleic acids are NOT DETECTED.  The SARS-CoV-2 RNA is generally detectable in upper and lower respiratory specimens during the acute phase of infection. Negative results do not preclude SARS-CoV-2 infection, do not rule out co-infections with other pathogens, and should not be used as the sole basis for treatment or other patient management decisions. Negative results must be combined with clinical observations, patient history, and epidemiological information. The expected result is Negative.  Fact Sheet for Patients: HairSlick.nohttps://www.fda.gov/media/138098/download  Fact Sheet for Healthcare  Providers: quierodirigir.comhttps://www.fda.gov/media/138095/download  This test is not yet approved or cleared by the [place] FDA and  has been authorized for detection and/or diagnosis of SARS-CoV-2 by FDA under an Emergency Use Authorization (EUA). This EUA will remain  in effect (meaning this test can be used) for the duration of the COVID-19 declaration under Se                          ction 564(b)(1) of the Act, 21 U.S.C. section 360bbb-3(b)(1), unless the authorization is terminated or revoked sooner.  Performed at [facility], [address] [place],  [place] [address]     ECG: Date: 05/15/2020 Rate: 82 bpm Rhythm:  Sinus rhythm with first-degree AV block Axis (leads I and aVF): Left axis deviation Intervals: PR 224 ms. QRS 88 ms. QTc 472 ms. ST segment and T wave changes: No evidence of acute ST segment elevation or depression Comparison: Similar to previous tracing obtained on 06/20/2019 NOTE: Tracing obtained at [facility]; unable for review. Above based on cardiologist's interpretation.     IMAGING / PROCEDURES: DIAGNOSTIC RADIOGRAPHS RIGHT HIP performed on 08/01/2020 Extensive heterotrophic ossification from the acetabulum that appears to be impinging on the greater trochanter anteriorly  TRANSTHORACIC ECHOCARDIOGRAM performed on 07/31/2020 LVEF 55% Normal left ventricular systolic function Normal right ventricular systolic function Trivial MR Mild TR No AR or PR No valvular stenosis No evidence of pericardial effusion  MYOVIEW performed on 07/30/2011 LVEF 64% No evidence of stress-induced myocardial ischemia or arrhythmia Normal wall motion Bruce protocol with 9 minutes of activity; reached 85% of maximum predicted Baseline VS: 107/90, pulse 67 Stress VS: 185/48, pulse 133 Negative scan  Impression and Plan:  Krystalyn Kubota has been referred for pre-anesthesia review and clearance prior to her undergoing the planned anesthetic and procedural courses.  Available labs, pertinent testing, and imaging results were personally reviewed by me. This patient has been appropriately cleared by cardiology with an overall LOW risk of significant perioperative cardiovascular complications.  Based on clinical review performed today (08/29/20), barring any significant acute changes in the patient's overall condition, it is anticipated that she will be able to proceed with the planned surgical intervention. Any acute changes in clinical condition may necessitate her procedure being postponed and/or cancelled. Patient will meet with anesthesia team (MD and/or CRNA) on the day of her procedure for preoperative evaluation/assessment. Questions regarding anesthetic course will be fielded at that time.   Pre-surgical instructions were reviewed with the patient during her PAT appointment and questions were fielded by PAT clinical staff. Patient was advised that if any questions or concerns arise prior to her procedure then she should return a call to PAT and/or her surgeon's office to discuss.  Quentin Mulling, MSN, APRN, FNP-C, CEN [facility]  Peri-operative Services Nurse Practitioner Phone: [phone] Fax: [phone] 08/29/20 2:26 PM  NOTE: This note has been prepared using Dragon dictation software. Despite my best ability to proofread, there is always the potential that unintentional transcriptional errors may still occur from this process.

## 2020-08-23 ENCOUNTER — Ambulatory Visit
Admission: RE | Admit: 2020-08-23 | Discharge: 2020-08-23 | Disposition: A | Discharge status: Home / Self Care | Payer: Medicare Other | Source: Ambulatory Visit | Attending: Radiation Oncology | Admitting: Radiation Oncology

## 2020-08-23 DIAGNOSIS — M6158 Other ossification of muscle, other site: Secondary | ICD-10-CM | POA: Diagnosis not present

## 2020-08-23 DIAGNOSIS — M61551 Other ossification of muscle, right thigh: Secondary | ICD-10-CM | POA: Diagnosis not present

## 2020-08-23 DIAGNOSIS — Z96641 Presence of right artificial hip joint: Secondary | ICD-10-CM | POA: Diagnosis not present

## 2020-08-27 DIAGNOSIS — M6158 Other ossification of muscle, other site: Secondary | ICD-10-CM | POA: Diagnosis not present

## 2020-08-27 DIAGNOSIS — Z96641 Presence of right artificial hip joint: Secondary | ICD-10-CM | POA: Diagnosis not present

## 2020-08-27 DIAGNOSIS — M61551 Other ossification of muscle, right thigh: Secondary | ICD-10-CM | POA: Diagnosis not present

## 2020-08-28 ENCOUNTER — Encounter
Admission: RE | Admit: 2020-08-28 | Discharge: 2020-08-28 | Disposition: A | Discharge status: Home / Self Care | Payer: Medicare Other | Source: Ambulatory Visit | Attending: Orthopedic Surgery | Admitting: Orthopedic Surgery

## 2020-08-28 ENCOUNTER — Other Ambulatory Visit: Payer: Medicare Other

## 2020-08-28 ENCOUNTER — Other Ambulatory Visit: Payer: Self-pay

## 2020-08-28 DIAGNOSIS — Z01812 Encounter for preprocedural laboratory examination: Secondary | ICD-10-CM | POA: Insufficient documentation

## 2020-08-28 DIAGNOSIS — Z20822 Contact with and (suspected) exposure to covid-19: Secondary | ICD-10-CM | POA: Insufficient documentation

## 2020-08-28 LAB — BASIC METABOLIC PANEL
Anion gap: 8 (ref 5–15)
BUN: 18 mg/dL (ref 8–23)
CO2: 27 mmol/L (ref 22–32)
Calcium: 9.5 mg/dL (ref 8.9–10.3)
Chloride: 103 mmol/L (ref 98–111)
Creatinine, Ser: 0.83 mg/dL (ref 0.44–1.00)
GFR, Estimated: >60 mL/min (ref >60)
Glucose, Bld: 111 mg/dL — ABNORMAL HIGH (ref 70–99)
Potassium: 3.6 mmol/L (ref 3.5–5.1)
Sodium: 138 mmol/L (ref 135–145)

## 2020-08-28 LAB — CBC
HCT: 43.3 % (ref 36.0–46.0)
Hemoglobin: 14.7 g/dL (ref 12.0–15.0)
MCH: 27.9 pg (ref 26.0–34.0)
MCHC: 33.9 g/dL (ref 30.0–36.0)
MCV: 82.3 fL (ref 80.0–100.0)
Platelets: 326 10*3/uL (ref 150–400)
RBC: 5.26 MIL/uL — ABNORMAL HIGH (ref 3.87–5.11)
RDW: 15.2 % (ref 11.5–15.5)
WBC: 5.2 10*3/uL (ref 4.0–10.5)
nRBC: 0 % (ref 0.0–0.2)

## 2020-08-28 LAB — SARS CORONAVIRUS 2 (TAT 6-24 HRS): SARS Coronavirus 2: NEGATIVE

## 2020-08-30 ENCOUNTER — Encounter
Admission: RE | Disposition: A | Discharge status: Home / Self Care | Payer: Self-pay | Source: Home / Self Care | Attending: Orthopedic Surgery

## 2020-08-30 ENCOUNTER — Encounter: Payer: Self-pay | Admitting: Orthopedic Surgery

## 2020-08-30 ENCOUNTER — Other Ambulatory Visit: Payer: Self-pay

## 2020-08-30 ENCOUNTER — Inpatient Hospital Stay: Payer: Medicare Other | Admitting: Urgent Care

## 2020-08-30 ENCOUNTER — Inpatient Hospital Stay: Payer: Medicare Other

## 2020-08-30 ENCOUNTER — Ambulatory Visit
Admission: RE | Admit: 2020-08-30 | Discharge: 2020-08-31 | Disposition: A | Discharge status: Home / Self Care | Payer: Medicare Other | Attending: Orthopedic Surgery | Admitting: Orthopedic Surgery

## 2020-08-30 DIAGNOSIS — M9689 Other intraoperative and postprocedural complications and disorders of the musculoskeletal system: Secondary | ICD-10-CM | POA: Diagnosis not present

## 2020-08-30 DIAGNOSIS — Z419 Encounter for procedure for purposes other than remedying health state, unspecified: Secondary | ICD-10-CM

## 2020-08-30 DIAGNOSIS — R0789 Other chest pain: Secondary | ICD-10-CM

## 2020-08-30 DIAGNOSIS — T8489XA Other specified complication of internal orthopedic prosthetic devices, implants and grafts, initial encounter: Secondary | ICD-10-CM | POA: Diagnosis not present

## 2020-08-30 DIAGNOSIS — Y838 Other surgical procedures as the cause of abnormal reaction of the patient, or of later complication, without mention of misadventure at the time of the procedure: Secondary | ICD-10-CM | POA: Diagnosis not present

## 2020-08-30 DIAGNOSIS — M1611 Unilateral primary osteoarthritis, right hip: Secondary | ICD-10-CM | POA: Diagnosis not present

## 2020-08-30 DIAGNOSIS — M6150 Other ossification of muscle, unspecified site: Secondary | ICD-10-CM | POA: Insufficient documentation

## 2020-08-30 DIAGNOSIS — Z471 Aftercare following joint replacement surgery: Secondary | ICD-10-CM | POA: Diagnosis not present

## 2020-08-30 DIAGNOSIS — I10 Essential (primary) hypertension: Secondary | ICD-10-CM

## 2020-08-30 DIAGNOSIS — M96810 Intraoperative hemorrhage and hematoma of a musculoskeletal structure complicating a musculoskeletal system procedure: Secondary | ICD-10-CM | POA: Diagnosis not present

## 2020-08-30 DIAGNOSIS — M1651 Unilateral post-traumatic osteoarthritis, right hip: Secondary | ICD-10-CM | POA: Diagnosis not present

## 2020-08-30 DIAGNOSIS — Z96641 Presence of right artificial hip joint: Secondary | ICD-10-CM | POA: Diagnosis not present

## 2020-08-30 DIAGNOSIS — M160 Bilateral primary osteoarthritis of hip: Secondary | ICD-10-CM | POA: Diagnosis not present

## 2020-08-30 DIAGNOSIS — E785 Hyperlipidemia, unspecified: Secondary | ICD-10-CM

## 2020-08-30 HISTORY — DX: Other forms of angina pectoris: I20.89

## 2020-08-30 HISTORY — PX: TOTAL HIP ARTHROPLASTY: SHX124

## 2020-08-30 HISTORY — DX: Unspecified osteoarthritis, unspecified site: M19.90

## 2020-08-30 HISTORY — DX: Other specified disorders of bone, unspecified site: M89.8X9

## 2020-08-30 HISTORY — DX: Other forms of angina pectoris: I20.8

## 2020-08-30 HISTORY — DX: Other specified cardiac arrhythmias: I49.8

## 2020-08-30 SURGERY — ARTHROPLASTY, HIP, TOTAL, ANTERIOR APPROACH
Anesthesia: Spinal | Site: Hip | Laterality: Right

## 2020-08-30 MED ORDER — CHLORHEXIDINE GLUCONATE 0.12 % MT SOLN
OROMUCOSAL | Status: AC
  Administered 2020-08-30: 15 mL via OROMUCOSAL
  Filled 2020-08-30: qty 15

## 2020-08-30 MED ORDER — OXYCODONE HCL 5 MG PO TABS: 5.0000 mg | ORAL_TABLET | ORAL | Status: DC | PRN

## 2020-08-30 MED ORDER — FENTANYL CITRATE (PF) 100 MCG/2ML IJ SOLN: 25.0000 ug | INTRAMUSCULAR | Status: DC | PRN

## 2020-08-30 MED ORDER — ONDANSETRON HCL 4 MG/2ML IJ SOLN
INTRAMUSCULAR | Status: DC | PRN
  Administered 2020-08-30: 4 mg via INTRAVENOUS

## 2020-08-30 MED ORDER — SODIUM CHLORIDE 0.9 % IV SOLN
INTRAVENOUS | Status: DC | PRN
  Administered 2020-08-30: 60 mL

## 2020-08-30 MED ORDER — SODIUM CHLORIDE 0.9 % IV SOLN: INTRAVENOUS | Status: DC

## 2020-08-30 MED ORDER — PROPOFOL 1000 MG/100ML IV EMUL
INTRAVENOUS | Status: AC
  Filled 2020-08-30: qty 100

## 2020-08-30 MED ORDER — FAMOTIDINE 20 MG PO TABS: 20.0000 mg | ORAL_TABLET | Freq: Once | ORAL | Status: AC

## 2020-08-30 MED ORDER — OXYCODONE HCL 5 MG PO TABS: 5.0000 mg | ORAL_TABLET | ORAL | 0 refills | Status: DC | PRN

## 2020-08-30 MED ORDER — 0.9 % SODIUM CHLORIDE (POUR BTL) OPTIME
TOPICAL | Status: DC | PRN
  Administered 2020-08-30: 1000 mL

## 2020-08-30 MED ORDER — ROSUVASTATIN CALCIUM 10 MG PO TABS: 10.0000 mg | ORAL_TABLET | Freq: Every evening | ORAL | Status: DC

## 2020-08-30 MED ORDER — FENTANYL CITRATE (PF) 100 MCG/2ML IJ SOLN
INTRAMUSCULAR | Status: AC
  Filled 2020-08-30: qty 2

## 2020-08-30 MED ORDER — HYDROMORPHONE HCL 1 MG/ML IJ SOLN: 0.5000 mg | INTRAMUSCULAR | Status: DC | PRN

## 2020-08-30 MED ORDER — EPHEDRINE SULFATE 50 MG/ML IJ SOLN
INTRAMUSCULAR | Status: DC | PRN
Start: 2020-08-30 — End: 2020-08-30
  Administered 2020-08-30 (×2): 15 mg via INTRAVENOUS

## 2020-08-30 MED ORDER — FENTANYL CITRATE (PF) 100 MCG/2ML IJ SOLN
INTRAMUSCULAR | Status: DC | PRN
  Administered 2020-08-30 (×4): 25 ug via INTRAVENOUS

## 2020-08-30 MED ORDER — OLMESARTAN MEDOXOMIL-HCTZ 40-25 MG PO TABS: 1.0000 | ORAL_TABLET | Freq: Every morning | ORAL | Status: DC

## 2020-08-30 MED ORDER — CLINDAMYCIN PHOSPHATE 900 MG/50ML IV SOLN
900.0000 mg | INTRAVENOUS | Status: AC
  Administered 2020-08-30: 900 mg via INTRAVENOUS

## 2020-08-30 MED ORDER — ACETAMINOPHEN 10 MG/ML IV SOLN
INTRAVENOUS | Status: DC | PRN
  Administered 2020-08-30: 1000 mg via INTRAVENOUS

## 2020-08-30 MED ORDER — GLYCOPYRROLATE 0.2 MG/ML IJ SOLN
INTRAMUSCULAR | Status: DC | PRN
  Administered 2020-08-30: .2 mg via INTRAVENOUS

## 2020-08-30 MED ORDER — FAMOTIDINE 20 MG PO TABS
ORAL_TABLET | ORAL | Status: AC
  Administered 2020-08-30: 20 mg via ORAL
  Filled 2020-08-30: qty 1

## 2020-08-30 MED ORDER — LACTATED RINGERS IV SOLN: INTRAVENOUS | Status: DC

## 2020-08-30 MED ORDER — BUPIVACAINE-EPINEPHRINE (PF) 0.25% -1:200000 IJ SOLN
INTRAMUSCULAR | Status: AC
  Filled 2020-08-30: qty 30

## 2020-08-30 MED ORDER — BUPIVACAINE-EPINEPHRINE 0.25% -1:200000 IJ SOLN
INTRAMUSCULAR | Status: DC | PRN
  Administered 2020-08-30: 30 mL

## 2020-08-30 MED ORDER — ONDANSETRON HCL 4 MG/2ML IJ SOLN: 4.0000 mg | Freq: Four times a day (QID) | INTRAMUSCULAR | Status: DC | PRN

## 2020-08-30 MED ORDER — ONDANSETRON HCL 4 MG PO TABS: 4.0000 mg | ORAL_TABLET | Freq: Four times a day (QID) | ORAL | Status: DC | PRN

## 2020-08-30 MED ORDER — MIDAZOLAM HCL 5 MG/5ML IJ SOLN
INTRAMUSCULAR | Status: DC | PRN
  Administered 2020-08-30 (×2): 1 mg via INTRAVENOUS

## 2020-08-30 MED ORDER — ASPIRIN EC 81 MG PO TBEC: 81.0000 mg | DELAYED_RELEASE_TABLET | Freq: Every evening | ORAL | Status: DC

## 2020-08-30 MED ORDER — METOCLOPRAMIDE HCL 5 MG/ML IJ SOLN: 5.0000 mg | Freq: Three times a day (TID) | INTRAMUSCULAR | Status: DC | PRN

## 2020-08-30 MED ORDER — CLINDAMYCIN PHOSPHATE 900 MG/50ML IV SOLN
INTRAVENOUS | Status: AC
  Filled 2020-08-30: qty 50

## 2020-08-30 MED ORDER — BUPIVACAINE LIPOSOME 1.3 % IJ SUSP
INTRAMUSCULAR | Status: AC
  Filled 2020-08-30: qty 20

## 2020-08-30 MED ORDER — PHENYLEPHRINE HCL (PRESSORS) 10 MG/ML IV SOLN
INTRAVENOUS | Status: DC | PRN
  Administered 2020-08-30: 100 ug via INTRAVENOUS

## 2020-08-30 MED ORDER — METOCLOPRAMIDE HCL 10 MG PO TABS: 5.0000 mg | ORAL_TABLET | Freq: Three times a day (TID) | ORAL | Status: DC | PRN

## 2020-08-30 MED ORDER — PROPOFOL 500 MG/50ML IV EMUL
INTRAVENOUS | Status: DC | PRN
Start: 2020-08-30 — End: 2020-08-30
  Administered 2020-08-30: 75 ug/kg/min via INTRAVENOUS

## 2020-08-30 MED ORDER — OXYCODONE HCL 5 MG PO TABS: 10.0000 mg | ORAL_TABLET | ORAL | Status: DC | PRN

## 2020-08-30 MED ORDER — METHOCARBAMOL 1000 MG/10ML IJ SOLN
500.0000 mg | Freq: Four times a day (QID) | INTRAVENOUS | Status: DC | PRN
  Filled 2020-08-30: qty 5

## 2020-08-30 MED ORDER — PHENYLEPHRINE HCL (PRESSORS) 10 MG/ML IV SOLN
INTRAVENOUS | Status: AC
  Filled 2020-08-30: qty 1

## 2020-08-30 MED ORDER — METHOCARBAMOL 500 MG PO TABS: 500.0000 mg | ORAL_TABLET | Freq: Four times a day (QID) | ORAL | Status: DC | PRN

## 2020-08-30 MED ORDER — SODIUM CHLORIDE FLUSH 0.9 % IV SOLN
INTRAVENOUS | Status: AC
  Filled 2020-08-30: qty 40

## 2020-08-30 MED ORDER — ACETAMINOPHEN 10 MG/ML IV SOLN
INTRAVENOUS | Status: AC
  Filled 2020-08-30: qty 100

## 2020-08-30 MED ORDER — CLINDAMYCIN PHOSPHATE 600 MG/50ML IV SOLN
600.0000 mg | Freq: Four times a day (QID) | INTRAVENOUS | Status: AC
  Administered 2020-08-30 (×2): 600 mg via INTRAVENOUS
  Filled 2020-08-30 (×2): qty 50

## 2020-08-30 MED ORDER — ACETAMINOPHEN 325 MG PO TABS
325.0000 mg | ORAL_TABLET | Freq: Four times a day (QID) | ORAL | Status: DC | PRN
Start: 2020-08-31 — End: 2020-08-31

## 2020-08-30 MED ORDER — ZOLPIDEM TARTRATE 5 MG PO TABS: 5.0000 mg | ORAL_TABLET | Freq: Every evening | ORAL | Status: DC | PRN

## 2020-08-30 MED ORDER — CHLORHEXIDINE GLUCONATE 0.12 % MT SOLN: 15.0000 mL | Freq: Once | OROMUCOSAL | Status: AC

## 2020-08-30 MED ORDER — ORAL CARE MOUTH RINSE: 15.0000 mL | Freq: Once | OROMUCOSAL | Status: AC

## 2020-08-30 MED ORDER — SODIUM CHLORIDE 0.9 % IV SOLN
INTRAVENOUS | Status: DC | PRN
  Administered 2020-08-30: 40 ug/min via INTRAVENOUS

## 2020-08-30 MED ORDER — MIDAZOLAM HCL 2 MG/2ML IJ SOLN
INTRAMUSCULAR | Status: AC
  Filled 2020-08-30: qty 2

## 2020-08-30 MED ORDER — BUPIVACAINE HCL (PF) 0.5 % IJ SOLN
INTRAMUSCULAR | Status: DC | PRN
  Administered 2020-08-30: 2.5 mL

## 2020-08-30 SURGICAL SUPPLY — 58 items
BLADE SAGITTAL AGGR TOOTH XLG (BLADE) ×2 IMPLANT
BNDG COHESIVE 6X5 TAN STRL LF (GAUZE/BANDAGES/DRESSINGS) ×6 IMPLANT
CANISTER SUCT 1200ML W/VALVE (MISCELLANEOUS) ×2 IMPLANT
CANISTER WOUND CARE 500ML ATS (WOUND CARE) ×2 IMPLANT
CHLORAPREP W/TINT 26 (MISCELLANEOUS) ×2 IMPLANT
COVER BACK TABLE REUSABLE LG (DRAPES) ×2 IMPLANT
DRAPE 3/4 80X56 (DRAPES) ×6 IMPLANT
DRAPE C-ARM XRAY 36X54 (DRAPES) ×2 IMPLANT
DRAPE INCISE IOBAN 66X60 STRL (DRAPES) IMPLANT
DRAPE POUCH INSTRU U-SHP 10X18 (DRAPES) ×2 IMPLANT
DRESSING SURGICEL FIBRLLR 1X2 (HEMOSTASIS) ×2 IMPLANT
DRSG MEPILEX SACRM 8.7X9.8 (GAUZE/BANDAGES/DRESSINGS) ×2 IMPLANT
DRSG OPSITE POSTOP 4X8 (GAUZE/BANDAGES/DRESSINGS) ×4 IMPLANT
DRSG SURGICEL FIBRILLAR 1X2 (HEMOSTASIS) ×4
ELECT BLADE 6.5 EXT (BLADE) ×2 IMPLANT
ELECT REM PT RETURN 9FT ADLT (ELECTROSURGICAL) ×2
ELECTRODE REM PT RTRN 9FT ADLT (ELECTROSURGICAL) ×1 IMPLANT
GAUZE 4X4 16PLY ~~LOC~~+RFID DBL (SPONGE) ×2 IMPLANT
GLOVE SURG SYN 9.0  PF PI (GLOVE) ×2
GLOVE SURG SYN 9.0 PF PI (GLOVE) ×2 IMPLANT
GLOVE SURG UNDER POLY LF SZ9 (GLOVE) ×2 IMPLANT
GOWN SRG 2XL LVL 4 RGLN SLV (GOWNS) ×1 IMPLANT
GOWN STRL NON-REIN 2XL LVL4 (GOWNS) ×1
GOWN STRL REUS W/ TWL LRG LVL3 (GOWN DISPOSABLE) ×1 IMPLANT
GOWN STRL REUS W/TWL LRG LVL3 (GOWN DISPOSABLE) ×1
HEMOVAC 400CC 10FR (MISCELLANEOUS) IMPLANT
HOLDER FOLEY CATH W/STRAP (MISCELLANEOUS) ×2 IMPLANT
HOOD PEEL AWAY FLYTE STAYCOOL (MISCELLANEOUS) ×2 IMPLANT
IRRIGATION SURGIPHOR STRL (IV SOLUTION) IMPLANT
KIT PREVENA INCISION MGT 13 (CANNISTER) ×2 IMPLANT
MANIFOLD NEPTUNE II (INSTRUMENTS) ×2 IMPLANT
MAT ABSORB  FLUID 56X50 GRAY (MISCELLANEOUS) ×1
MAT ABSORB FLUID 56X50 GRAY (MISCELLANEOUS) ×1 IMPLANT
NDL SAFETY ECLIPSE 18X1.5 (NEEDLE) ×1 IMPLANT
NEEDLE HYPO 18GX1.5 SHARP (NEEDLE) ×1
NEEDLE SPNL 20GX3.5 QUINCKE YW (NEEDLE) ×4 IMPLANT
NS IRRIG 1000ML POUR BTL (IV SOLUTION) ×2 IMPLANT
PACK HIP COMPR (MISCELLANEOUS) ×2 IMPLANT
SCALPEL PROTECTED #10 DISP (BLADE) ×4 IMPLANT
SOL PREP PVP 2OZ (MISCELLANEOUS) ×2
SOLUTION PREP PVP 2OZ (MISCELLANEOUS) ×1 IMPLANT
SPONGE DRAIN TRACH 4X4 STRL 2S (GAUZE/BANDAGES/DRESSINGS) ×2 IMPLANT
SPONGE T-LAP 18X18 ~~LOC~~+RFID (SPONGE) ×4 IMPLANT
STAPLER SKIN PROX 35W (STAPLE) ×2 IMPLANT
STRAP SAFETY 5IN WIDE (MISCELLANEOUS) ×2 IMPLANT
SUT DVC 2 QUILL PDO  T11 36X36 (SUTURE) ×1
SUT DVC 2 QUILL PDO T11 36X36 (SUTURE) ×1 IMPLANT
SUT SILK 0 (SUTURE) ×1
SUT SILK 0 30XBRD TIE 6 (SUTURE) ×1 IMPLANT
SUT V-LOC 90 ABS DVC 3-0 CL (SUTURE) ×2 IMPLANT
SUT VIC AB 1 CT1 36 (SUTURE) ×2 IMPLANT
SYR 20ML LL LF (SYRINGE) ×2 IMPLANT
SYR 30ML LL (SYRINGE) ×2 IMPLANT
SYR 50ML LL SCALE MARK (SYRINGE) ×4 IMPLANT
SYR BULB IRRIG 60ML STRL (SYRINGE) ×2 IMPLANT
TAPE MICROFOAM 4IN (TAPE) ×2 IMPLANT
TOWEL OR 17X26 4PK STRL BLUE (TOWEL DISPOSABLE) ×2 IMPLANT
TRAY FOLEY MTR SLVR 16FR STAT (SET/KITS/TRAYS/PACK) ×2 IMPLANT

## 2020-08-30 NOTE — H&P (Signed)
Chief Complaint  Patient presents with   Right Hip - Follow-up    History of the Present Illness: Lisa Steele is a 75 y.o. female here today.   The patient presents for follow-up evaluation status post prior right hip surgery. She is having increasing pain, so she came back for follow-up. X-ray obtained today.  The patient states she has a pinching sensation in the anterior aspect of her right hip when she bends her right hip or tries to stand. She states it takes her a minute to stand up straight, and once she gets moving, and she tries not to bend over. She states she has a slight limp.   The patient states she is having pain that has transferred to her left knee. 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  Of note, the patient states she has had all of the COVID vaccines and wears a mask in crowds.  I have reviewed past medical, surgical, social and family history, and allergies as documented in the EMR.  Past Medical History: Past Medical History:  Diagnosis Date   Abnormal EKG   Chickenpox   DJD (degenerative joint disease)   Hypercholesterolemia   Hypertension   Obesity   Traumatic rotator cuff tear   Past Surgical History: Past Surgical History:  Procedure Laterality Date   ARTHROPLASTY HIP TOTAL Right 06/16/2019  Jedrek Dinovo   ARTHROSCOPIC ROTATOR CUFF REPAIR Left 11/25/2017   Bunion left foot 2008   CATARACT SURGERY 10/10/13, 10/24/13   COLONOSCOPY 03/14/2011, 09/17/2007, 07/28/2003  PH Colon Polyps [facility]): CBF 03/2016; Recall Ltr mailed 01/21/2016 (dw)   COLONOSCOPY 06/25/2016  PH Colon Polyps: CBF 06/2021   Ectopic pregnancy 1978   JOINT REPLACEMENT   Lumpectomy left breast 1972  benign   TONSILLECTOMY   total hip arthroplasty Right 06/16/2019  Ammar Moffatt   TUBAL LIGATION   Past Family History: Family History  Problem Relation Age of Onset   Diabetes Mother   Stroke Mother   Myocardial Infarction (Heart attack) Mother   Aneurysm  Father   Diabetes Sister   High blood pressure (Hypertension) Other   Medications: Current Outpatient Medications Ordered in Epic  Medication Sig Dispense Refill   aspirin 81 MG EC tablet Take 81 mg by mouth once daily.   docusate (COLACE) 100 MG capsule Take 1 capsule by mouth 2 (two) times daily   olmesartan-hydrochlorothiazide (BENICAR HCT) 40-25 mg tablet Take by mouth Take 1 tablet by mouth daily.   polyethylene glycol (MIRALAX) packet Take 1 packet (17 g total) by mouth once daily as needed for Constipation Mix in 4-8ounces of fluid prior to taking. 1 packet 5   rosuvastatin (CRESTOR) 10 MG tablet Take 10 mg by mouth nightly.   No current Epic-ordered facility-administered medications on file.   Allergies: Allergies  Allergen Reactions   Tramadol Nausea and Nausea And Vomiting   Doxycycline Itching   Penicillins Hives and Other (See Comments)  leg lesions and weakness (couldn't walk)    Body mass index is 35.57 kg/m.  Review of Systems: A comprehensive 14 point ROS was performed, reviewed, and the pertinent orthopaedic findings are documented in the HPI.  Vitals:  08/01/20 0947  BP: 138/84    General Physical Examination:   General/Constitutional: No apparent distress: well-nourished and well developed. Eyes: Pupils equal, round with synchronous movement. Lungs: Clear to auscultation HEENT: Normal Vascular: No edema, swelling or tenderness, except as noted in detailed exam. Cardiac: Heart rate and rhythm is regular. Integumentary: No  impressive skin lesions present, except as noted in detailed exam. Neuro/Psych: Normal mood and affect, oriented to person, place and time.  Musculoskeletal Examination:  On exam, right hip has 0 degrees internal rotation and 25 degrees external. Pain with leg raising. Tenderness to the right hip. Slight limp.  Radiographs:  AP pelvis and lateral x-rays of the right hip were ordered and personally reviewed today. These show  extensive heterotopic ossification from the acetabulum that appears to be impinging on the greater trochanter anteriorly.  X-ray Impression Extensive heterotopic ossification, compared to 11/2019, that has advanced.  Assessment: ICD-10-CM  1. 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  We discussed the patient's x-ray findings. I explained she has some heterotopic ossification from the acetabulum that appears to be impinging on the greater trochanter anteriorly. She is a Brooker grade 3. I explained her choice is either living with it or we can trim out the heterotopic ossification. I explained the radiation oncologist can give a dose of radiation treatment to keep it from growing back. She will see Dr. Carmina Miller, MD prior to surgery. I gave the patient a printout on heterotopic ossification. I explained surgery to remove it is a last resort, as it can form again.  The patient will be scheduled for excision of heterotopic ossification of the right total hip arthroplasty in the near future.  Surgical Risks:  The nature of the condition and the proposed procedure has been reviewed in detail with the patient. Surgical versus non-surgical options and prognosis for recovery have been reviewed and the inherent risks and benefits of each have been discussed including the risks of infection, bleeding, injury to nerves/blood vessels/tendons, incomplete relief of symptoms, persisting pain and/or stiffness, loss of function, complex regional pain syndrome, failure of the procedure, as appropriate.  Attestation: I, Dawn Royse, am documenting for [employer], MD utilizing Nuance DAX.    Electronically signed by Marlena Clipper, MD at 08/01/2020 10:08 PM EDT  Reviewed   H+P. No changes noted.

## 2020-08-30 NOTE — Discharge Instructions (Signed)
AMBULATORY SURGERY  ?DISCHARGE INSTRUCTIONS ? ? ?The drugs that you were given will stay in your system until tomorrow so for the next 24 hours you should not: ? ?Drive an automobile ?Make any legal decisions ?Drink any alcoholic beverage ? ? ?You may resume regular meals tomorrow.  Today it is better to start with liquids and gradually work up to solid foods. ? ?You may eat anything you prefer, but it is better to start with liquids, then soup and crackers, and gradually work up to solid foods. ? ? ?Please notify your doctor immediately if you have any unusual bleeding, trouble breathing, redness and pain at the surgery site, drainage, fever, or pain not relieved by medication. ? ? ? ?Additional Instructions: ? ? ? ?Please contact your physician with any problems or Same Day Surgery at [phone], Monday through Friday 6 am to 4 pm, or [place] [facility] [place] [facility] number at [phone].  ?

## 2020-08-30 NOTE — Op Note (Signed)
08/30/2020  9:24 AM  PATIENT:  Lisa Steele  75 y.o. 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  PROCEDURE:  Procedure(s): Right hip heterotopic ossification removal (Right)  SURGEON: Leitha Schuller, MD  ASSISTANTS: None  ANESTHESIA:   spinal  EBL:  Total I/O In: 50 [IV Piggyback:50] Out: 500 [Urine:500]  BLOOD ADMINISTERED:none  DRAINS:  Incisional wound VAC    LOCAL MEDICATIONS USED:  MARCAINE    and OTHER Exparel  SPECIMEN:  No Specimen  DISPOSITION OF SPECIMEN:  N/A  COUNTS:  YES  TOURNIQUET:  * No tourniquets in log *  IMPLANTS: None  DICTATION: .Dragon Dictation patient brought the operating room and after adequate spinal anesthesia was obtained she was placed on the operative table with right leg in the Medacta attachment.  The hip was prepped draped you sterile fashion with appropriate patient identification and timeout procedure completed.  The prior skin scar was elliptically excised in the subcutaneous tissue divided to get down to the tensor fascia.  This was incised and the muscle retracted posteriorly to expose the capsule and extensive heterotopic ossification.  C arm was used to help localize the heterotopic ossification with use of osteotomes and rongeurs and the bone was removed till there is no impinging bone remaining.  The wound was thoroughly irrigated and infiltrated with the above medication after adequate excision.  The wound was then closed using a heavy Quill deep followed by 3 oh V-Loc subcutaneous staples and an incisional wound VAC.  PLAN OF CARE: Discharge to home after PACU  PATIENT DISPOSITION:  PACU - hemodynamically stable.

## 2020-08-30 NOTE — Transfer of Care (Signed)
Immediate Anesthesia Transfer of Care Note  Patient: Lisa Steele  Procedure(s) Performed: Right hip heterotopic ossification removal (Right: Hip)  Patient Location: PACU  Anesthesia Type:Spinal  Level of Consciousness: sedated  Airway & Oxygen Therapy: Patient Spontanous Breathing and Patient connected to face mask oxygen  Post-op Assessment: Report given to RN and Post -op Vital signs reviewed and stable  Post vital signs: Reviewed and stable  Last Vitals:  Vitals Value Taken Time  BP    Temp    Pulse    Resp    SpO2      Last Pain:  Vitals:   08/30/20 0627  TempSrc: Temporal  PainSc: 0-No pain         Complications: No notable events documented.

## 2020-08-30 NOTE — Anesthesia Procedure Notes (Signed)
Spinal  Patient location during procedure: OR Start time: 08/30/2020 7:35 AM End time: 08/30/2020 7:55 AM Reason for block: surgical anesthesia Staffing Performed: resident/CRNA and anesthesiologist  Preanesthetic Checklist Completed: patient identified, IV checked, site marked, risks and benefits discussed, surgical consent, monitors and equipment checked, pre-op evaluation and timeout performed Spinal Block Patient position: sitting Prep: Betadine Patient monitoring: heart rate, continuous pulse ox, blood pressure and cardiac monitor Approach: midline Location: L2-3 Injection technique: single-shot Needle Needle type: Whitacre and Introducer  Needle gauge: 25 G Needle length: 9 cm Assessment Events: CSF return Additional Notes Negative paresthesia. Negative blood return. Positive free-flowing CSF. Expiration date of kit checked and confirmed. Patient tolerated procedure well, without complications.

## 2020-08-30 NOTE — Anesthesia Preprocedure Evaluation (Signed)
Anesthesia Evaluation  Patient identified by MRN, date of birth, ID band Patient awake    Reviewed: Allergy & Precautions, NPO status , Patient's Chart, lab work & pertinent test results  History of Anesthesia Complications Negative for: history of anesthetic complications  Airway Mallampati: II       Dental  (+) Dental Advidsory Given   Pulmonary neg sleep apnea, neg COPD, Not current smoker, former smoker,           Cardiovascular hypertension, Pt. on medications (-) angina(-) Past MI and (-) CHF (-) dysrhythmias (-) Valvular Problems/Murmurs     Neuro/Psych neg Seizures    GI/Hepatic Neg liver ROS, neg GERD  ,  Endo/Other  neg diabetes  Renal/GU negative Renal ROS     Musculoskeletal   Abdominal   Peds  Hematology   Anesthesia Other Findings Past Medical History: No date: 1st degree AV block No date: Cataracts, bilateral No date: DJD (degenerative joint disease) No date: Equivalent angina (HCC) No date: Fluttering heart No date: Heterotopic ossification     Comment:  RIGHT hip No date: Hyperlipidemia No date: Hypertension No date: Obesity No date: Osteoarthritis   Reproductive/Obstetrics                             Anesthesia Physical  Anesthesia Plan  ASA: 2  Anesthesia Plan: Spinal   Post-op Pain Management:    Induction:   PONV Risk Score and Plan: 2 and TIVA and Propofol infusion  Airway Management Planned: Natural Airway and Simple Face Mask  Additional Equipment:   Intra-op Plan:   Post-operative Plan:   Informed Consent: I have reviewed the patients History and Physical, chart, labs and discussed the procedure including the risks, benefits and alternatives for the proposed anesthesia with the patient or authorized representative who has indicated his/her understanding and acceptance.       Plan Discussed with:   Anesthesia Plan Comments:          Anesthesia Quick Evaluation

## 2020-08-31 ENCOUNTER — Encounter: Payer: Self-pay | Admitting: Orthopedic Surgery

## 2020-08-31 ENCOUNTER — Ambulatory Visit: Payer: Medicare Other | Attending: Radiation Oncology

## 2020-08-31 DIAGNOSIS — M6158 Other ossification of muscle, other site: Secondary | ICD-10-CM | POA: Diagnosis not present

## 2020-08-31 DIAGNOSIS — M160 Bilateral primary osteoarthritis of hip: Secondary | ICD-10-CM | POA: Diagnosis not present

## 2020-08-31 DIAGNOSIS — Z96641 Presence of right artificial hip joint: Secondary | ICD-10-CM | POA: Diagnosis not present

## 2020-08-31 DIAGNOSIS — M6150 Other ossification of muscle, unspecified site: Secondary | ICD-10-CM | POA: Diagnosis not present

## 2020-08-31 DIAGNOSIS — T8489XA Other specified complication of internal orthopedic prosthetic devices, implants and grafts, initial encounter: Secondary | ICD-10-CM | POA: Diagnosis not present

## 2020-08-31 DIAGNOSIS — M61551 Other ossification of muscle, right thigh: Secondary | ICD-10-CM | POA: Diagnosis not present

## 2020-08-31 MED ORDER — ACETAMINOPHEN 325 MG PO TABS
325.0000 mg | ORAL_TABLET | Freq: Four times a day (QID) | ORAL | Status: DC | PRN
  Administered 2020-08-31: 650 mg via ORAL
  Filled 2020-08-31: qty 2

## 2020-08-31 MED ORDER — OXYCODONE HCL 5 MG PO TABS: 5.0000 mg | ORAL_TABLET | ORAL | 0 refills | Status: DC | PRN

## 2020-08-31 NOTE — Progress Notes (Signed)
PT Iv removed by charge RN , dressing c,d,I. Pollie Meyer [place] in place working wel with no drainage in canister. Pt assisted with getting dressed and collecting belongings by NT, all d/c education provided by charge RN. Pt taken to radiology by transport services.

## 2020-08-31 NOTE — Discharge Summary (Signed)
Physician Discharge Summary  Subjective: 1 Day Post-Op Procedure(s) (LRB): Right hip heterotopic ossification removal (Right) Patient reports pain as mild.   Patient seen in rounds with Dr. Rosita Kea. Patient is well, and has had no acute complaints or problems Patient is ready to go home after radiation therapy  Physician Discharge Summary  Patient ID: Lisa Steele MRN: [id number] DOB/AGE: 1945-06-21 75 y.o.  Admit date: 08/30/2020 Discharge date: 08/31/2020  Admission Diagnoses:  Discharge Diagnoses:  Active Problems:   Postoperative heterotopic ossification   Discharged Condition: good  Hospital Course: The patient is postop day 1 from a right hip ossification removal.  She did well last night with her pain control.  Her vitals have remained stable.  The patient has a wound VAC that is slowly suctioning.  She is ready to go home after receiving radiation today.  Treatments: surgery:  Right hip heterotopic ossification removal (Right)   SURGEON: Leitha Schuller, MD   ASSISTANTS: None   ANESTHESIA:   spinal   EBL:  Total I/O In: 50 [IV Piggyback:50] Out: 500 [Urine:500]   BLOOD ADMINISTERED:none   DRAINS:  Incisional wound VAC     LOCAL MEDICATIONS USED:  MARCAINE    and OTHER Exparel   SPECIMEN:  No Specimen   DISPOSITION OF SPECIMEN:  N/A   COUNTS:  YES   TOURNIQUET:  * No tourniquets in log *   IMPLANTS: None  Discharge Exam: Blood pressure (!) 115/44, pulse 72, temperature 98.4 F (36.9 C), resp. rate 18, height 5\' 3"  (1.6 m), weight 90.7 kg, SpO2 97 %.   Disposition: Discharge disposition: 01-Home or Self Care        Allergies as of 08/31/2020       Reactions   Tramadol Nausea And Vomiting   Doxycycline Itching   Penicillins Hives, Rash   leg lesions and weakness (couldn't walk) Did it involve swelling of the face/tongue/throat, SOB, or low BP? No Did it involve sudden or severe rash/hives, skin peeling, or any reaction on the inside of your  mouth or nose? No Did you need to seek medical attention at a hospital or doctor's office? Yes When did it last happen?      38 years If all above answers are "NO", may proceed with cephalosporin use.        Medication List     STOP taking these medications    Vitamin D 50 MCG (2000 UT) Caps       TAKE these medications    aspirin EC 81 MG tablet Take 1 tablet (81 mg total) by mouth every evening. Swallow whole.   mometasone 0.1 % lotion Commonly known as: ELOCON Apply 1 drop topically 3 (three) times a week. Into ear at bedtime   olmesartan-hydrochlorothiazide 40-25 MG tablet Commonly known as: BENICAR HCT Take 1 tablet by mouth in the morning.   oxyCODONE 5 MG immediate release tablet Commonly known as: Roxicodone Take 1 tablet (5 mg total) by mouth every 4 (four) hours as needed for moderate pain.   rosuvastatin 10 MG tablet Commonly known as: CRESTOR Take 1 tablet (10 mg total) by mouth every evening.   Systane 0.4-0.3 % Soln Generic drug: Polyethyl Glycol-Propyl Glycol Place 1 drop into both eyes at bedtime.        Follow-up Information     11/01/2020, PA-C. Go on 09/06/2020.   Specialties: Orthopedic Surgery, Emergency Medicine Why: Appt @ 8:45 am Contact information: [address] [place] [phone]  SignedLenard Forth, Talvin Christianson 08/31/2020, 7:09 AM   Objective: Vital signs in last 24 hours: Temp:  [97 F (36.1 C)-99.2 F (37.3 C)] 98.4 F (36.9 C) (07/01 0517) Pulse Rate:  [50-79] 72 (07/01 0517) Resp:  [12-20] 18 (07/01 0517) BP: (98-138)/(44-93) 115/44 (07/01 0517) SpO2:  [97 %-100 %] 97 % (07/01 0517)  Intake/Output from previous day:  Intake/Output Summary (Last 24 hours) at 08/31/2020 0709 Last data filed at 08/31/2020 0550 Gross per 24 hour  Intake 3021.9 ml  Output 600 ml  Net 2421.9 ml    Intake/Output this shift: No intake/output data recorded.  Labs: Recent Labs    08/28/20 0857   HGB 14.7   Recent Labs    08/28/20 0857  WBC 5.2  RBC 5.26*  HCT 43.3  PLT 326   Recent Labs    08/28/20 0857  NA 138  K 3.6  CL 103  CO2 27  BUN 18  CREATININE 0.83  GLUCOSE 111*  CALCIUM 9.5   No results for input(s): LABPT, INR in the last 72 hours.  EXAM: General - Patient is Alert and Oriented Extremity - Neurovascular intact Sensation intact distally Compartment soft Incision - clean, dry, with the wound VAC in place and suctioning Motor Function -plantarflexion and dorsiflexion are intact  Assessment/Plan: 1 Day Post-Op Procedure(s) (LRB): Right hip heterotopic ossification removal (Right) Procedure(s) (LRB): Right hip heterotopic ossification removal (Right) Past Medical History:  Diagnosis Date   1st degree AV block    Cataracts, bilateral    DJD (degenerative joint disease)    Equivalent angina (HCC)    Fluttering heart    Heterotopic ossification    RIGHT hip   Hyperlipidemia    Hypertension    Obesity    Osteoarthritis    Active Problems:   Postoperative heterotopic ossification  Estimated body mass index is 35.43 kg/m as calculated from the following:   Height as of this encounter: 5\' 3"  (1.6 m).   Weight as of this encounter: 90.7 kg. Advance diet Up with therapy D/C IV fluids Diet - Regular diet Follow up - in 7 days Activity - WBAT Disposition - Home Condition Upon Discharge - Stable DVT Prophylaxis - None  , PA-C Orthopaedic Surgery 08/31/2020, 7:09 AM

## 2020-08-31 NOTE — Progress Notes (Signed)
  Subjective: 1 Day Post-Op Procedure(s) (LRB): Right hip heterotopic ossification removal (Right) Patient reports pain as mild.   Patient is well, and has had no acute complaints or problems Plan is to go Home after hospital stay. Negative for chest pain and shortness of breath Fever: no Gastrointestinal: Negative for nausea and vomiting  Objective: Vital signs in last 24 hours: Temp:  [97 F (36.1 C)-99.2 F (37.3 C)] 98.4 F (36.9 C) (07/01 0517) Pulse Rate:  [50-79] 72 (07/01 0517) Resp:  [12-20] 18 (07/01 0517) BP: (98-138)/(44-93) 115/44 (07/01 0517) SpO2:  [97 %-100 %] 97 % (07/01 0517)  Intake/Output from previous day:  Intake/Output Summary (Last 24 hours) at 08/31/2020 0705 Last data filed at 08/31/2020 0550 Gross per 24 hour  Intake 3021.9 ml  Output 600 ml  Net 2421.9 ml    Intake/Output this shift: No intake/output data recorded.  Labs: Recent Labs    08/28/20 0857  HGB 14.7   Recent Labs    08/28/20 0857  WBC 5.2  RBC 5.26*  HCT 43.3  PLT 326   Recent Labs    08/28/20 0857  NA 138  K 3.6  CL 103  CO2 27  BUN 18  CREATININE 0.83  GLUCOSE 111*  CALCIUM 9.5   No results for input(s): LABPT, INR in the last 72 hours.   EXAM General - Patient is Alert and Oriented Extremity - Neurovascular intact Sensation intact distally Compartment soft Dressing/Incision - clean, dry, with the wound VAC in place Motor Function - intact, moving foot and toes well on exam.   Past Medical History:  Diagnosis Date   1st degree AV block    Cataracts, bilateral    DJD (degenerative joint disease)    Equivalent angina (HCC)    Fluttering heart    Heterotopic ossification    RIGHT hip   Hyperlipidemia    Hypertension    Obesity    Osteoarthritis     Assessment/Plan: 1 Day Post-Op Procedure(s) (LRB): Right hip heterotopic ossification removal (Right) Active Problems:   Postoperative heterotopic ossification  Estimated body mass index is 35.43  kg/m as calculated from the following:   Height as of this encounter: 5\' 3"  (1.6 m).   Weight as of this encounter: 90.7 kg. Advance diet Up with therapy D/C IV fluids  Discharge home today after receiving radiation therapy. DVT Prophylaxis - None Weight-Bearing as tolerated to right leg  , PA-C Orthopaedic Surgery 08/31/2020, 7:05 AM

## 2020-08-31 NOTE — Anesthesia Postprocedure Evaluation (Signed)
Anesthesia Post Note  Patient: Lisa Steele  Procedure(s) Performed: Right hip heterotopic ossification removal (Right: Hip)  Patient location during evaluation: Nursing Unit Anesthesia Type: Spinal Level of consciousness: oriented and awake and alert Pain management: pain level controlled Vital Signs Assessment: post-procedure vital signs reviewed and stable Respiratory status: spontaneous breathing and respiratory function stable Cardiovascular status: blood pressure returned to baseline and stable Postop Assessment: no headache, no backache, no apparent nausea or vomiting and patient able to bend at knees Anesthetic complications: no   No notable events documented.   Last Vitals:  Vitals:   08/31/20 0517 08/31/20 0808  BP: (!) 115/44 (!) 129/49  Pulse: 72 71  Resp: 18 18  Temp: 36.9 C 36.6 C  SpO2: 97% 98%    Last Pain:  Vitals:   08/31/20 0515  TempSrc: Oral  PainSc:                  Lynden Oxford

## 2020-09-07 ENCOUNTER — Encounter: Payer: Self-pay | Admitting: Orthopedic Surgery

## 2020-09-12 ENCOUNTER — Ambulatory Visit
Admission: RE | Admit: 2020-09-12 | Discharge: 2020-09-12 | Disposition: A | Discharge status: Home / Self Care | Payer: Medicare Other | Source: Ambulatory Visit | Attending: Family Medicine | Admitting: Family Medicine

## 2020-09-12 ENCOUNTER — Other Ambulatory Visit: Payer: Self-pay

## 2020-09-12 DIAGNOSIS — E2839 Other primary ovarian failure: Secondary | ICD-10-CM | POA: Diagnosis not present

## 2020-09-12 DIAGNOSIS — Z1231 Encounter for screening mammogram for malignant neoplasm of breast: Secondary | ICD-10-CM | POA: Diagnosis not present

## 2020-09-12 DIAGNOSIS — Z780 Asymptomatic menopausal state: Secondary | ICD-10-CM | POA: Diagnosis not present

## 2020-09-18 DIAGNOSIS — I208 Other forms of angina pectoris: Secondary | ICD-10-CM | POA: Diagnosis not present

## 2020-09-26 DIAGNOSIS — I440 Atrioventricular block, first degree: Secondary | ICD-10-CM | POA: Diagnosis not present

## 2020-09-26 DIAGNOSIS — I498 Other specified cardiac arrhythmias: Secondary | ICD-10-CM | POA: Diagnosis not present

## 2020-09-26 DIAGNOSIS — G4733 Obstructive sleep apnea (adult) (pediatric): Secondary | ICD-10-CM | POA: Diagnosis not present

## 2020-09-26 DIAGNOSIS — I208 Other forms of angina pectoris: Secondary | ICD-10-CM | POA: Diagnosis not present

## 2020-09-26 DIAGNOSIS — R9431 Abnormal electrocardiogram [ECG] [EKG]: Secondary | ICD-10-CM | POA: Diagnosis not present

## 2020-09-26 DIAGNOSIS — I10 Essential (primary) hypertension: Secondary | ICD-10-CM | POA: Diagnosis not present

## 2020-09-26 DIAGNOSIS — M169 Osteoarthritis of hip, unspecified: Secondary | ICD-10-CM | POA: Diagnosis not present

## 2020-09-26 DIAGNOSIS — E669 Obesity, unspecified: Secondary | ICD-10-CM | POA: Diagnosis not present

## 2020-10-02 ENCOUNTER — Other Ambulatory Visit: Payer: Self-pay | Admitting: Family Medicine

## 2020-10-02 DIAGNOSIS — I10 Essential (primary) hypertension: Secondary | ICD-10-CM

## 2020-10-02 NOTE — Telephone Encounter (Signed)
Requested Prescriptions  Pending Prescriptions Disp Refills  . olmesartan-hydrochlorothiazide (BENICAR HCT) 40-25 MG tablet [Pharmacy Med Name: Olmesartan Medoxomil-HCTZ 40-25 MG Oral Tablet] 90 tablet 0    Sig: TAKE 1 TABLET BY MOUTH  DAILY     Cardiovascular: ARB + Diuretic Combos Failed - 10/02/2020 10:49 PM      Failed - Last BP in normal range    BP Readings from Last 1 Encounters:  08/31/20 (!) 129/49         Passed - K in normal range and within 180 days    Potassium  Date Value Ref Range Status  08/28/2020 3.6 3.5 - 5.1 mmol/L Final         Passed - Na in normal range and within 180 days    Sodium  Date Value Ref Range Status  08/28/2020 138 135 - 145 mmol/L Final  08/15/2015 143 134 - 144 mmol/L Final         Passed - Cr in normal range and within 180 days    Creat  Date Value Ref Range Status  10/26/2019 0.85 0.60 - 0.93 mg/dL Final    Comment:    For patients >28 years of age, the reference limit for Creatinine is approximately 13% higher for people identified as African-American. .    Creatinine, Ser  Date Value Ref Range Status  08/28/2020 0.83 0.44 - 1.00 mg/dL Final         Passed - Ca in normal range and within 180 days    Calcium  Date Value Ref Range Status  08/28/2020 9.5 8.9 - 10.3 mg/dL Final         Passed - Patient is not pregnant      Passed - Valid encounter within last 6 months    Recent Outpatient Visits          5 months ago Atypical chest pain   [facility], Danna Hefty, MD   11 months ago Obesity (BMI 30-39.9)   [facility] [name], MD   1 year ago Dysuria   [facility] Jamelle Haring, MD   1 year ago Benign essential HTN   [facility] [name], MD   1 year ago Benign essential HTN   [facility] [name], MD      Future Appointments            In 3 weeks Carlynn Purl, Danna Hefty, MD [facility]   In 5 months  [facility]

## 2020-10-15 DIAGNOSIS — M9689 Other intraoperative and postprocedural complications and disorders of the musculoskeletal system: Secondary | ICD-10-CM | POA: Diagnosis not present

## 2020-10-15 DIAGNOSIS — M160 Bilateral primary osteoarthritis of hip: Secondary | ICD-10-CM | POA: Diagnosis not present

## 2020-10-15 DIAGNOSIS — M6150 Other ossification of muscle, unspecified site: Secondary | ICD-10-CM | POA: Diagnosis not present

## 2020-10-18 ENCOUNTER — Other Ambulatory Visit: Payer: Self-pay | Admitting: Family Medicine

## 2020-10-18 DIAGNOSIS — E785 Hyperlipidemia, unspecified: Secondary | ICD-10-CM

## 2020-10-25 NOTE — Progress Notes (Signed)
Name: Lisa Steele   MRN: [id number]    DOB: [birth year]   Date:10/29/2020       Progress Note  Subjective  Chief Complaint  Follow Up  HPI  HTN: taking bp medication daily. She denies SOB , palpitation or  dizziness. No side effects of medication . BP is at goal   Chest pain: she was seen by Dr. Juliann Pares and had negative stress test, he recommended her to follow up with pulmonologist to discuss sleep study tomorrow.    Hyperlipidemia: taking Crestor, no myalgias, compliant with medications. Last LDL 89. We will recheck labs today    Morbid obesity: BMI above 35 with multiple co-morbidities, she lost a few pounds, and she is going to increase her physical activity now that she has been released from PT    History of right hip replacement : 06/2019, on follow up noticed to have heterotopic ossification, she went back for a revision 08/30/20 and had radiation to prevent recurrence done by [facility] July 1 st 2022 She had PT, she has been using a cane when she is out of the house for a long time, she is also stiff in am's and has to use when she first gets up.   Right anterior chest wall pain: noticed a few days ago, seems to related to movement of right shoulder and sometimes during palpation, not associated with SOB, it is brief, resolves when not moving chest wall .   Patient Active Problem List   Diagnosis Date Noted   Postoperative heterotopic ossification 08/30/2020   Follow up 10/25/2019   Primary localized osteoarthritis 06/18/2019   Status post total hip replacement, right 06/16/2019   Trigger finger of left thumb 03/04/2018   Left hand pain 02/03/2018   S/P rotator cuff repair 01/12/2018   Injury of left rotator cuff 11/03/2017   Obesity (BMI 30.0-34.9) 02/19/2017   Primary osteoarthritis of both knees 06/22/2014   Obesity (BMI 30-39.9) 06/22/2014   Dysmetabolic syndrome 06/22/2014   Post-menopausal 06/22/2014   H/O cold sores 06/22/2014   Dyslipidemia 06/22/2014    Elevated fasting blood sugar 06/22/2014   Benign essential HTN 06/22/2014   Abnormal ECG 06/22/2014    Past Surgical History:  Procedure Laterality Date   APPENDECTOMY     BREAST EXCISIONAL BIOPSY Left 1970   neg   BREAST SURGERY Left    cyst removed   BUNIONECTOMY Left 2008   CATARACT EXTRACTION, BILATERAL  10/2013   COLONOSCOPY     2005, 2009, 2013   COLONOSCOPY WITH PROPOFOL N/A 06/25/2016   Procedure: COLONOSCOPY WITH PROPOFOL;  Surgeon: Scot Jun, MD;  Location: [facility] ENDOSCOPY;  Service: Endoscopy;  Laterality: N/A;   ECTOPIC PREGNANCY SURGERY  1978   EYE SURGERY     JOINT REPLACEMENT     SHOULDER ARTHROSCOPY WITH ROTATOR CUFF REPAIR AND SUBACROMIAL DECOMPRESSION Left 11/25/2017   Procedure: SHOULDER ARTHROSCOPic release of long head biceps tendon and mini open rotator cuff repair;  Surgeon: Erin Sons, MD;  Location: [facility] ORS;  Service: Orthopedics;  Laterality: Left;   TONSILECTOMY, ADENOIDECTOMY, BILATERAL MYRINGOTOMY AND TUBES     TONSILLECTOMY     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   Procedure: TOTAL HIP ARTHROPLASTY ANTERIOR APPROACH;  Surgeon: Kennedy Bucker, MD;  Location: [facility] ORS;  Service: Orthopedics;  Laterality: Right;   TOTAL HIP ARTHROPLASTY Right 08/30/2020   Procedure: Right hip heterotopic ossification removal;  Surgeon: Kennedy Bucker, MD;  Location: [facility] ORS;  Service: Orthopedics;  Laterality: Right;   TUBAL  LIGATION      Family History  Problem Relation Age of Onset   Diabetes Mother    Hypertension Mother    Heart disease Mother    Hyperlipidemia Mother    Heart disease Father    Diabetes Sister    Heart disease Sister    Hypertension Sister    Hyperlipidemia Sister    Breast cancer Sister 69   Cancer Sister 37       breast   Breast cancer Maternal Aunt    Diabetes Sister    Stroke Sister    Diabetes Sister    Diabetes Sister     Social History   Tobacco Use   Smoking status: Former    Years: 10.00    Types:  Cigarettes    Start date: 03/03/1958   Smokeless tobacco: Never  Substance Use Topics   Alcohol use: Yes    Alcohol/week: 0.0 standard drinks    Comment: socially     Current Outpatient Medications:    aspirin EC 81 MG tablet, Take 1 tablet (81 mg total) by mouth every evening. Swallow whole., Disp: , Rfl:    mometasone (ELOCON) 0.1 % lotion, Apply 1 drop topically 3 (three) times a week. Into ear at bedtime, Disp: , Rfl:    olmesartan-hydrochlorothiazide (BENICAR HCT) 40-25 MG tablet, TAKE 1 TABLET BY MOUTH  DAILY, Disp: 90 tablet, Rfl: 0   Polyethyl Glycol-Propyl Glycol (SYSTANE) 0.4-0.3 % SOLN, Place 1 drop into both eyes at bedtime., Disp: , Rfl:    rosuvastatin (CRESTOR) 10 MG tablet, TAKE 1 TABLET BY MOUTH  DAILY, Disp: 90 tablet, Rfl: 0   oxyCODONE (ROXICODONE) 5 MG immediate release tablet, Take 1 tablet (5 mg total) by mouth every 4 (four) hours as needed for moderate pain. (Patient not taking: Reported on 10/29/2020), Disp: 30 tablet, Rfl: 0  Allergies  Allergen Reactions   Tramadol Nausea And Vomiting   Doxycycline Itching   Penicillins Hives and Rash    leg lesions and weakness (couldn't walk)  Did it involve swelling of the face/tongue/throat, SOB, or low BP? No Did it involve sudden or severe rash/hives, skin peeling, or any reaction on the inside of your mouth or nose? No Did you need to seek medical attention at a hospital or doctor's office? Yes When did it last happen?      38 years If all above answers are "NO", may proceed with cephalosporin use.    I personally reviewed active problem list, medication list, allergies, family history, social history, health maintenance with the patient/caregiver today.   ROS  Constitutional: Negative for fever or weight change.  Respiratory: Negative for cough and shortness of breath.   Cardiovascular: Negative for chest pain or palpitations.  Gastrointestinal: Negative for abdominal pain, no bowel changes.  Musculoskeletal:  positive  for gait problem but no  joint swelling.  Skin: Negative for rash.  Neurological: Negative for dizziness or headache.  No other specific complaints in a complete review of systems (except as listed in HPI above).   Objective  Vitals:   10/29/20 1139  BP: 122/82  Pulse: 84  Resp: 16  Temp: 97.8 F (36.6 C)  SpO2: 99%  Weight: 203 lb (92.1 kg)  Height: 5\' 3"  (1.6 m)    Body mass index is 35.96 kg/m.  Physical Exam  Constitutional: Patient appears well-developed and well-nourished. Obese  No distress.  HEENT: head atraumatic, normocephalic, pupils equal and reactive to light, neck supple Cardiovascular: Normal rate, regular rhythm and  normal heart sounds.  No murmur heard. No BLE edema. Pulmonary/Chest: Effort normal and breath sounds normal. No respiratory distress. Abdominal: Soft.  There is no tenderness. Muscular skeletal: no pain with rom of hip, using a cane, no pain during palpation of costochondral joint, some discomfort when opening arms wide and reaching backwards  Psychiatric: Patient has a normal mood and affect. behavior is normal. Judgment and thought content normal.   Recent Results (from the past 2160 hour(s))  Basic metabolic panel per protocol     Status: Abnormal   Collection Time: 08/28/20  8:57 AM  Result Value Ref Range   Sodium 138 135 - 145 mmol/L   Potassium 3.6 3.5 - 5.1 mmol/L   Chloride 103 98 - 111 mmol/L   CO2 27 22 - 32 mmol/L   Glucose, Bld 111 (H) 70 - 99 mg/dL    Comment: Glucose reference range applies only to samples taken after fasting for at least 8 hours.   BUN 18 8 - 23 mg/dL   Creatinine, Ser 1.610.83 0.44 - 1.00 mg/dL   Calcium 9.5 8.9 - 09.610.3 mg/dL   GFR, Estimated >04>60 >54>60 mL/min    Comment: (NOTE) Calculated using the CKD-EPI Creatinine Equation (2021)    Anion gap 8 5 - 15    Comment: Performed at [facility], [address] [place], [place] [address]  CBC per protocol     Status: Abnormal   Collection  Time: 08/28/20  8:57 AM  Result Value Ref Range   WBC 5.2 4.0 - 10.5 K/uL   RBC 5.26 (H) 3.87 - 5.11 MIL/uL   Hemoglobin 14.7 12.0 - 15.0 g/dL   HCT 91.443.3 78.236.0 - 95.646.0 %   MCV 82.3 80.0 - 100.0 fL   MCH 27.9 26.0 - 34.0 pg   MCHC 33.9 30.0 - 36.0 g/dL   RDW 21.315.2 08.611.5 - 57.815.5 %   Platelets 326 150 - 400 K/uL   nRBC 0.0 0.0 - 0.2 %    Comment: Performed at [facility], [address] [place], [place] [address]  SARS CORONAVIRUS 2 (TAT 6-24 HRS) Nasopharyngeal Nasopharyngeal Swab     Status: None   Collection Time: 08/28/20  8:57 AM   Specimen: Nasopharyngeal Swab  Result Value Ref Range   SARS Coronavirus 2 NEGATIVE NEGATIVE    Comment: (NOTE) SARS-CoV-2 target nucleic acids are NOT DETECTED.  The SARS-CoV-2 RNA is generally detectable in upper and lower respiratory specimens during the acute phase of infection. Negative results do not preclude SARS-CoV-2 infection, do not rule out co-infections with other pathogens, and should not be used as the sole basis for treatment or other patient management decisions. Negative results must be combined with clinical observations, patient history, and epidemiological information. The expected result is Negative.  Fact Sheet for Patients: HairSlick.nohttps://www.fda.gov/media/138098/download  Fact Sheet for Healthcare Providers: quierodirigir.comhttps://www.fda.gov/media/138095/download  This test is not yet approved or cleared by the [place] FDA and  has been authorized for detection and/or diagnosis of SARS-CoV-2 by FDA under an Emergency Use Authorization (EUA). This EUA will remain  in effect (meaning this test can be used) for the duration of the COVID-19 declaration under Se ction 564(b)(1) of the Act, 21 U.S.C. section 360bbb-3(b)(1), unless the authorization is terminated or revoked sooner.  Performed at [facility], [address] [place], [place] [address]      PHQ2/9: [facility] screen [facility] 10/29/2020 04/27/2020 03/20/2020  10/26/2019 09/21/2019  Decreased Interest 0 0 0 0 0  Down, Depressed, Hopeless 0 0 0 0 0  PHQ - 2 Score 0 0 0 0 0  Altered sleeping - - - 0 0  Tired, decreased energy - - - 0 0  Change in appetite - - - 0 0  Feeling bad or failure about yourself  - - - 0 0  Trouble concentrating - - - 0 0  Moving slowly or fidgety/restless - - - 0 0  Suicidal thoughts - - - 0 0  PHQ-9 Score - - - 0 0  Difficult doing work/chores - - - - Not difficult at all    phq 9 is negative   Fall Risk: Fall Risk  10/29/2020 04/27/2020 03/20/2020 10/26/2019 09/21/2019  Falls in the past year? 0 0 0 0 0  Number falls in past yr: 0 0 0 0 0  Comment - - - - -  Injury with Fall? 0 0 0 0 0  Comment - - - - -  Risk for fall due to : No Fall Risks - No Fall Risks - -  Follow up Falls prevention discussed - Falls prevention discussed - -     Functional Status Survey: Is the patient deaf or have difficulty hearing?: No Does the patient have difficulty seeing, even when wearing glasses/contacts?: No Does the patient have difficulty concentrating, remembering, or making decisions?: No Does the patient have difficulty walking or climbing stairs?: No Does the patient have difficulty dressing or bathing?: No Does the patient have difficulty doing errands alone such as visiting a doctor's office or shopping?: No    Assessment & Plan  1. Dyslipidemia  - Hepatic function panel - Lipid panel  2. Morbid obesity (HCC)  Discussed with the patient the risk posed by an increased BMI. Discussed importance of portion control, calorie counting and at least 150 minutes of physical activity weekly. Avoid sweet beverages and drink more water. Eat at least 6 servings of fruit and vegetables daily    3. Benign essential HTN  - olmesartan-hydrochlorothiazide (BENICAR HCT) 40-25 MG tablet; Take 1 tablet by mouth daily.  Dispense: 90 tablet; Refill: 0  4. Status post total hip replacement, right   5. Elevated red blood cell  count  Keep follow up with pneumologist   6. Snoring   7. Long-term use of high-risk medication  - Hepatic function panel

## 2020-10-26 ENCOUNTER — Ambulatory Visit: Payer: Medicare Other | Admitting: Family Medicine

## 2020-10-29 ENCOUNTER — Ambulatory Visit (INDEPENDENT_AMBULATORY_CARE_PROVIDER_SITE_OTHER): Payer: Medicare Other | Admitting: Family Medicine

## 2020-10-29 ENCOUNTER — Encounter: Payer: Self-pay | Admitting: Family Medicine

## 2020-10-29 ENCOUNTER — Other Ambulatory Visit: Payer: Self-pay

## 2020-10-29 VITALS — BP 122/82 | HR 84 | Temp 97.8°F | Resp 16 | Ht 63.0 in | Wt 203.0 lb

## 2020-10-29 DIAGNOSIS — I10 Essential (primary) hypertension: Secondary | ICD-10-CM | POA: Diagnosis not present

## 2020-10-29 DIAGNOSIS — Z96641 Presence of right artificial hip joint: Secondary | ICD-10-CM

## 2020-10-29 DIAGNOSIS — R718 Other abnormality of red blood cells: Secondary | ICD-10-CM

## 2020-10-29 DIAGNOSIS — Z79899 Other long term (current) drug therapy: Secondary | ICD-10-CM

## 2020-10-29 DIAGNOSIS — R0683 Snoring: Secondary | ICD-10-CM | POA: Diagnosis not present

## 2020-10-29 DIAGNOSIS — E785 Hyperlipidemia, unspecified: Secondary | ICD-10-CM | POA: Diagnosis not present

## 2020-10-29 MED ORDER — OLMESARTAN MEDOXOMIL-HCTZ 40-25 MG PO TABS: 1.0000 | ORAL_TABLET | Freq: Every day | ORAL | 0 refills | Status: DC

## 2020-10-30 DIAGNOSIS — G479 Sleep disorder, unspecified: Secondary | ICD-10-CM | POA: Diagnosis not present

## 2020-10-30 DIAGNOSIS — Z87891 Personal history of nicotine dependence: Secondary | ICD-10-CM | POA: Diagnosis not present

## 2020-10-30 LAB — LIPID PANEL
Cholesterol: 198 mg/dL (ref <200)
HDL: 75 mg/dL (ref >=50)
LDL Cholesterol (Calc): 104 mg/dL (calc) — ABNORMAL HIGH
Non-HDL Cholesterol (Calc): 123 mg/dL (calc) (ref <130)
Total CHOL/HDL Ratio: 2.6 (calc) (ref <5.0)
Triglycerides: 93 mg/dL (ref <150)

## 2020-10-30 LAB — HEPATIC FUNCTION PANEL
AG Ratio: 1.6 (calc) (ref 1.0–2.5)
ALT: 12 U/L (ref 6–29)
AST: 15 U/L (ref 10–35)
Albumin: 4.6 g/dL (ref 3.6–5.1)
Alkaline phosphatase (APISO): 80 U/L (ref 37–153)
Bilirubin, Direct: 0.1 mg/dL (ref 0.0–0.2)
Globulin: 2.8 g/dL (calc) (ref 1.9–3.7)
Indirect Bilirubin: 0.7 mg/dL (calc) (ref 0.2–1.2)
Total Bilirubin: 0.8 mg/dL (ref 0.2–1.2)
Total Protein: 7.4 g/dL (ref 6.1–8.1)

## 2020-11-15 ENCOUNTER — Telehealth: Payer: Self-pay | Admitting: *Deleted

## 2020-11-15 NOTE — Chronic Care Management (AMB) (Signed)
  Chronic Care Management   Outreach Note  11/15/2020 Name: Lisa Steele MRN: [id number] DOB: Jul 21, 1945  Lisa Steele is a 75 y.o. year old female who is a primary care patient of [name], MD. I reached out to [name] by phone today in response to a referral sent by [name]'s PCP [name], MD     An unsuccessful telephone outreach was attempted today. The patient was referred to the case management team for assistance with care management and care coordination.   Follow Up Plan: If patient returns call to provider office, please advise to call Embedded Care Management Care Guide [name] at [phone]  Burman Nieves, CCMA Care Guide, Embedded Care Coordination [facility]  Care Management  Direct Dial: [phone]

## 2020-11-23 NOTE — Chronic Care Management (AMB) (Signed)
  Chronic Care Management   Note  11/23/2020 Name: Anora Schwenke MRN: [id number] DOB: 1945/06/14  Chaney Ingram is a 75 y.o. year old female who is a primary care patient of Steele Sizer, MD. I reached out to Darrin Nipper by phone today in response to a referral sent by Ms. Joaquim Lai Villalva's PCP.  Ms. Frink was given information about Chronic Care Management services today including:  CCM service includes personalized support from designated clinical staff supervised by her physician, including individualized plan of care and coordination with other care providers 24/7 contact phone numbers for assistance for urgent and routine care needs. Service will only be billed when office clinical staff spend 20 minutes or more in a month to coordinate care. Only one practitioner may furnish and bill the service in a calendar month. The patient may stop CCM services at any time (effective at the end of the month) by phone call to the office staff. The patient is responsible for co-pay (up to 20% after annual deductible is met) if co-pay is required by the individual health plan.   Patient agreed to services and verbal consent obtained.   Follow up plan: Telephone appointment with care management team member scheduled for: 12/06/2020  Julian Hy, [place] [facility]  Direct Dial: [phone]

## 2020-12-01 DIAGNOSIS — G4733 Obstructive sleep apnea (adult) (pediatric): Secondary | ICD-10-CM | POA: Diagnosis not present

## 2020-12-06 ENCOUNTER — Telehealth: Payer: Medicare Other

## 2020-12-06 ENCOUNTER — Telehealth: Payer: Self-pay

## 2020-12-06 NOTE — Telephone Encounter (Signed)
  Care Management   Follow Up Note   12/06/2020 Name: Lisa Steele MRN: [id number] DOB: 02/09/1946   Primary Care Provider: [name], MD Reason for referral : Chronic Care Management   An unsuccessful telephone outreach was attempted today. The patient was referred to the case management team for assistance with care management and care coordination.    Follow Up Plan:  A HIPAA compliant voice message was left today requesting a return call.   [facility] Care Management [facility] [phone]

## 2020-12-14 ENCOUNTER — Telehealth: Payer: Self-pay

## 2020-12-14 DIAGNOSIS — Z23 Encounter for immunization: Secondary | ICD-10-CM | POA: Diagnosis not present

## 2020-12-14 NOTE — Telephone Encounter (Signed)
  Care Management   Follow Up Note   12/14/2020 Name: Luv Mish MRN: [id number] DOB: 1945-11-27   Primary Care Provider: [name], MD Reason for referral : Chronic Care Management   An unsuccessful telephone outreach was attempted today. The patient was referred to the case management team for assistance with care management and care coordination.    Follow Up Plan:  A HIPAA compliant voice message was left today requesting a return call.    [facility] Care Management [facility] [phone]

## 2020-12-20 ENCOUNTER — Ambulatory Visit: Payer: Self-pay

## 2020-12-20 DIAGNOSIS — E785 Hyperlipidemia, unspecified: Secondary | ICD-10-CM

## 2020-12-20 NOTE — Chronic Care Management (AMB) (Signed)
  Chronic Care Management   CCM RN Visit Note  12/20/2020 Name: [name] MRN: [id number] DOB: [birth year]  Subjective: Lisa Steele is a 75 y.o. year old female who is a primary care patient of [name], MD. The care management team was consulted for assistance with disease management and care coordination needs.    [name] was referred to the care management team for assistance with chronic care management and care coordination. Her primary care provider will be notified of our unsuccessful attempts to establish contact. The care management team will gladly outreach at any time in the future if she is interested in receiving assistance.  PLAN The care management team will gladly follow up with [name] after the primary care provider has a conversation with her regarding recommendation for care management engagement and subsequent re-referral for care management services.   [facility] Care Management [facility] [phone]

## 2020-12-23 ENCOUNTER — Other Ambulatory Visit: Payer: Self-pay | Admitting: Family Medicine

## 2020-12-23 DIAGNOSIS — I10 Essential (primary) hypertension: Secondary | ICD-10-CM

## 2021-01-08 ENCOUNTER — Other Ambulatory Visit: Payer: Self-pay | Admitting: Family Medicine

## 2021-01-08 DIAGNOSIS — E785 Hyperlipidemia, unspecified: Secondary | ICD-10-CM

## 2021-03-20 DIAGNOSIS — G4733 Obstructive sleep apnea (adult) (pediatric): Secondary | ICD-10-CM | POA: Diagnosis not present

## 2021-03-21 ENCOUNTER — Ambulatory Visit (INDEPENDENT_AMBULATORY_CARE_PROVIDER_SITE_OTHER): Payer: Medicare Other

## 2021-03-21 DIAGNOSIS — Z1211 Encounter for screening for malignant neoplasm of colon: Secondary | ICD-10-CM | POA: Diagnosis not present

## 2021-03-21 DIAGNOSIS — G4733 Obstructive sleep apnea (adult) (pediatric): Secondary | ICD-10-CM | POA: Insufficient documentation

## 2021-03-21 DIAGNOSIS — G4730 Sleep apnea, unspecified: Secondary | ICD-10-CM | POA: Insufficient documentation

## 2021-03-21 DIAGNOSIS — Z0000 Encounter for general adult medical examination without abnormal findings: Secondary | ICD-10-CM | POA: Diagnosis not present

## 2021-03-21 NOTE — Patient Instructions (Signed)
Lisa Steele , Thank you for taking time to come for your Medicare Wellness Visit. I appreciate your ongoing commitment to your health goals. Please review the following plan we discussed and let me know if I can assist you in the future.   Screening recommendations/referrals: Colonoscopy: done 06/25/16. Referral sent to [facility] today for repeat screening colonoscopy. They will contact you for an appointment.  Mammogram: done 09/12/20 Bone Density: done 09/12/20 Recommended yearly ophthalmology/optometry visit for glaucoma screening and checkup Recommended yearly dental visit for hygiene and checkup  Vaccinations: Influenza vaccine: done 12/2020 Pneumococcal vaccine: done 08/14/14 Tdap vaccine: done 08/19/17 Shingles vaccine: done 11/22/18 & 10/28/19   Covid-19:done 03/25/19, 04/15/19, 12/12/19, 06/08/20 & 12/24/20  Advanced directives: Advance directive discussed with you today. I have provided a copy for you to complete at home and have notarized. Once this is complete please bring a copy in to our office so we can scan it into your chart.   Conditions/risks identified: Recommend increasing physical activity as tolerated  Next appointment: Follow up in one year for your annual wellness visit    Preventive Care 65 Years and Older, Female Preventive care refers to lifestyle choices and visits with your health care provider that can promote health and wellness. What does preventive care include? A yearly physical exam. This is also called an annual well check. Dental exams once or twice a year. Routine eye exams. Ask your health care provider how often you should have your eyes checked. Personal lifestyle choices, including: Daily care of your teeth and gums. Regular physical activity. Eating a healthy diet. Avoiding tobacco and drug use. Limiting alcohol use. Practicing safe sex. Taking low-dose aspirin every day. Taking vitamin and mineral supplements as recommended by your  health care provider. What happens during an annual well check? The services and screenings done by your health care provider during your annual well check will depend on your age, overall health, lifestyle risk factors, and family history of disease. Counseling  Your health care provider may ask you questions about your: Alcohol use. Tobacco use. Drug use. Emotional well-being. Home and relationship well-being. Sexual activity. Eating habits. History of falls. Memory and ability to understand (cognition). Work and work Astronomer. Reproductive health. Screening  You may have the following tests or measurements: Height, weight, and BMI. Blood pressure. Lipid and cholesterol levels. These may be checked every 5 years, or more frequently if you are over 60 years old. Skin check. Lung cancer screening. You may have this screening every year starting at age 67 if you have a 30-pack-year history of smoking and currently smoke or have quit within the past 15 years. Fecal occult blood test (FOBT) of the stool. You may have this test every year starting at age 75. Flexible sigmoidoscopy or colonoscopy. You may have a sigmoidoscopy every 5 years or a colonoscopy every 10 years starting at age 75. Hepatitis C blood test. Hepatitis B blood test. Sexually transmitted disease (STD) testing. Diabetes screening. This is done by checking your blood sugar (glucose) after you have not eaten for a while (fasting). You may have this done every 1-3 years. Bone density scan. This is done to screen for osteoporosis. You may have this done starting at age 75. Mammogram. This may be done every 1-2 years. Talk to your health care provider about how often you should have regular mammograms. Talk with your health care provider about your test results, treatment options, and if necessary, the need for more tests. Vaccines  Your health care provider may recommend certain vaccines, such as: Influenza vaccine.  This is recommended every year. Tetanus, diphtheria, and acellular pertussis (Tdap, Td) vaccine. You may need a Td booster every 10 years. Zoster vaccine. You may need this after age 75. Pneumococcal 13-valent conjugate (PCV13) vaccine. One dose is recommended after age 75. Pneumococcal polysaccharide (PPSV23) vaccine. One dose is recommended after age 75. Talk to your health care provider about which screenings and vaccines you need and how often you need them. This information is not intended to replace advice given to you by your health care provider. Make sure you discuss any questions you have with your health care provider. Document Released: 03/16/2015 Document Revised: 11/07/2015 Document Reviewed: 12/19/2014 Elsevier Interactive Patient Education  2017 [place] Prevention in the Home Falls can cause injuries. They can happen to people of all ages. There are many things you can do to make your home safe and to help prevent falls. What can I do on the outside of my home? Regularly fix the edges of walkways and driveways and fix any cracks. Remove anything that might make you trip as you walk through a door, such as a raised step or threshold. Trim any bushes or trees on the path to your home. Use bright outdoor lighting. Clear any walking paths of anything that might make someone trip, such as rocks or tools. Regularly check to see if handrails are loose or broken. Make sure that both sides of any steps have handrails. Any raised decks and porches should have guardrails on the edges. Have any leaves, snow, or ice cleared regularly. Use sand or salt on walking paths during winter. Clean up any spills in your garage right away. This includes oil or grease spills. What can I do in the bathroom? Use night lights. Install grab bars by the toilet and in the tub and shower. Do not use towel bars as grab bars. Use non-skid mats or decals in the tub or shower. If you need to sit  down in the shower, use a plastic, non-slip stool. Keep the floor dry. Clean up any water that spills on the floor as soon as it happens. Remove soap buildup in the tub or shower regularly. Attach bath mats securely with double-sided non-slip rug tape. Do not have throw rugs and other things on the floor that can make you trip. What can I do in the bedroom? Use night lights. Make sure that you have a light by your bed that is easy to reach. Do not use any sheets or blankets that are too big for your bed. They should not hang down onto the floor. Have a firm chair that has side arms. You can use this for support while you get dressed. Do not have throw rugs and other things on the floor that can make you trip. What can I do in the kitchen? Clean up any spills right away. Avoid walking on wet floors. Keep items that you use a lot in easy-to-reach places. If you need to reach something above you, use a strong step stool that has a grab bar. Keep electrical cords out of the way. Do not use floor polish or wax that makes floors slippery. If you must use wax, use non-skid floor wax. Do not have throw rugs and other things on the floor that can make you trip. What can I do with my stairs? Do not leave any items on the stairs. Make sure that there are  handrails on both sides of the stairs and use them. Fix handrails that are broken or loose. Make sure that handrails are as long as the stairways. Check any carpeting to make sure that it is firmly attached to the stairs. Fix any carpet that is loose or worn. Avoid having throw rugs at the top or bottom of the stairs. If you do have throw rugs, attach them to the floor with carpet tape. Make sure that you have a light switch at the top of the stairs and the bottom of the stairs. If you do not have them, ask someone to add them for you. What else can I do to help prevent falls? Wear shoes that: Do not have high heels. Have rubber bottoms. Are  comfortable and fit you well. Are closed at the toe. Do not wear sandals. If you use a stepladder: Make sure that it is fully opened. Do not climb a closed stepladder. Make sure that both sides of the stepladder are locked into place. Ask someone to hold it for you, if possible. Clearly mark and make sure that you can see: Any grab bars or handrails. First and last steps. Where the edge of each step is. Use tools that help you move around (mobility aids) if they are needed. These include: Canes. Walkers. Scooters. Crutches. Turn on the lights when you go into a dark area. Replace any light bulbs as soon as they burn out. Set up your furniture so you have a clear path. Avoid moving your furniture around. If any of your floors are uneven, fix them. If there are any pets around you, be aware of where they are. Review your medicines with your doctor. Some medicines can make you feel dizzy. This can increase your chance of falling. Ask your doctor what other things that you can do to help prevent falls. This information is not intended to replace advice given to you by your health care provider. Make sure you discuss any questions you have with your health care provider. Document Released: 12/14/2008 Document Revised: 07/26/2015 Document Reviewed: 03/24/2014 Elsevier Interactive Patient Education  2017 [employer].

## 2021-03-21 NOTE — Progress Notes (Signed)
Subjective:   Lisa Steele is a 76 y.o. female who presents for Medicare Annual (Subsequent) preventive examination.  Virtual Visit via Telephone Note  I connected with  Temitayo Khosla on 03/21/21 at  9:20 AM EST by telephone and verified that I am speaking with the correct person using two identifiers.  Location: Patient: home Provider: [place] Persons participating in the virtual visit: [place]   I discussed the limitations, risks, security and privacy concerns of performing an evaluation and management service by telephone and the availability of in person appointments. The patient expressed understanding and agreed to proceed.  Interactive audio and video telecommunications were attempted between this nurse and patient, however failed, due to patient having technical difficulties OR patient did not have access to video capability.  We continued and completed visit with audio only.  Some vital signs may be absent or patient reported.   Clemetine Marker, LPN   Review of Systems     Cardiac Risk Factors include: advanced age (>75men, >71 women);dyslipidemia;hypertension;obesity (BMI >30kg/m2)     Objective:    There were no vitals filed for this visit. There is no height or weight on file to calculate BMI.  Advanced Directives 03/21/2021 08/30/2020 08/17/2020 08/09/2020 03/20/2020 06/16/2019 06/16/2019  Does Patient Have a Medical Advance Directive? Yes Yes Yes No Yes Yes Yes  Type of Paramedic of [place];Living will Living will - [place];Living will [place];Living will [place]  Does patient want to make changes to medical advance directive? Yes (MAU/Ambulatory/Procedural Areas - Information given) No - Patient declined - - - No - Patient declined No - Patient declined  Copy of [place] in Chart? No - copy requested - - - No - copy  requested No - copy requested No - copy requested  Would patient like information on creating a medical advance directive? - - - No - Patient declined - No - Patient declined No - Patient declined    Current Medications (verified) Outpatient Encounter Medications as of 03/21/2021  Medication Sig   aspirin EC 81 MG tablet Take 1 tablet (81 mg total) by mouth every evening. Swallow whole.   clindamycin (CLEOCIN) 150 MG capsule SMARTSIG:4 Capsule(s) By Mouth Once   olmesartan-hydrochlorothiazide (BENICAR HCT) 40-25 MG tablet TAKE 1 TABLET BY MOUTH  DAILY   Polyethyl Glycol-Propyl Glycol (SYSTANE) 0.4-0.3 % SOLN Place 1 drop into both eyes at bedtime.   rosuvastatin (CRESTOR) 10 MG tablet TAKE 1 TABLET BY MOUTH  DAILY   [DISCONTINUED] mometasone (ELOCON) 0.1 % lotion Apply 1 drop topically 3 (three) times a week. Into ear at bedtime   No facility-administered encounter medications on file as of 03/21/2021.    Allergies (verified) Tramadol, Doxycycline, and Penicillins   History: Past Medical History:  Diagnosis Date   1st degree AV block    Cataracts, bilateral    DJD (degenerative joint disease)    Equivalent angina (HCC)    Fluttering heart    Heterotopic ossification    RIGHT hip   Hyperlipidemia    Hypertension    Obesity    Osteoarthritis    Sleep apnea    wears CPAP - Dr. Raul Del   Past Surgical History:  Procedure Laterality Date   APPENDECTOMY     BREAST EXCISIONAL BIOPSY Left 1970   neg   BREAST SURGERY Left    cyst removed   BUNIONECTOMY Left 2008   CATARACT  EXTRACTION, BILATERAL  10/2013   COLONOSCOPY     2005, 2009, 2013   COLONOSCOPY WITH PROPOFOL N/A 06/25/2016   Procedure: COLONOSCOPY WITH PROPOFOL;  Surgeon: Manya Silvas, MD;  [facility] END[facility]COPY;  Service: Endoscopy;  Laterality: N/A;   ECTOPIC PREGNANCY SURGERY  1978   EYE SURGERY     JOINT REPLACEMENT     SHOULDER ARTHROSCOPY WITH ROTATOR CUFF REPAIR AND SUBACROMIAL DECOMPRESSION Left  11/25/2017   Procedure: SHOULDER ARTHROSCOPic release of long head biceps tendon and mini open rotator cuff repair;  Surgeon: Leanor Kail, MD;  Location: [facility] ORS;  Service: Orthopedics;  Laterality: Left;   TONSILECTOMY, ADENOIDECTOMY, BILATERAL MYRINGOTOMY AND TUBES     TONSILLECTOMY     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   Procedure: TOTAL HIP ARTHROPLASTY ANTERIOR APPROACH;  Surgeon: Hessie Knows, MD;  Location: [facility] ORS;  Service: Orthopedics;  Laterality: Right;   TOTAL HIP ARTHROPLASTY Right 08/30/2020   Procedure: Right hip heterotopic ossification removal;  Surgeon: Hessie Knows, MD;  Location: [facility] ORS;  Service: Orthopedics;  Laterality: Right;   TUBAL LIGATION     Family History  Problem Relation Age of Onset   Diabetes Mother    Hypertension Mother    Heart disease Mother    Hyperlipidemia Mother    Heart disease Father    Diabetes Sister    Heart disease Sister    Hypertension Sister    Hyperlipidemia Sister    Breast cancer Sister 30   Cancer Sister 62       breast   Breast cancer Maternal Aunt    Diabetes Sister    Stroke Sister    Diabetes Sister    Diabetes Sister    Social History   Socioeconomic History   Marital status: Married    Spouse name: Elonda Husky    Number of children: 1   Years of education: Not on file   Highest education level: Associate degree: academic program  Occupational History   Occupation: Counsellor     Comment: [employer]   Tobacco Use   Smoking status: Former    Years: 10.00    Types: Cigarettes    Start date: 03/03/1958   Smokeless tobacco: Never  Vaping Use   Vaping Use: Never used  Substance and Sexual Activity   Alcohol use: Yes    Alcohol/week: 0.0 standard drinks    Comment: socially   Drug use: No   Sexual activity: Not Currently    Comment: husband has ED  Other Topics Concern   Not on file  Social History Narrative   Married, son lives in [place] and has 3 grandchildren ,  youngest born 10/2016   Social Determinants of Health   Financial Resource Strain: Low Risk    Difficulty of Paying Living Expenses: Not hard at all  Food Insecurity: No Food Insecurity   Worried About Charity fundraiser in the Last Year: Never true   Arboriculturist in the Last Year: Never true  Transportation Needs: No Transportation Needs   Lack of Transportation (Medical): No   Lack of Transportation (Non-Medical): No  Physical Activity: Inactive   Days of Exercise per Week: 0 days   Minutes of Exercise per Session: 0 min  Stress: No Stress Concern Present   Feeling of Stress : Not at all  Social Connections: Socially Integrated   Frequency of Communication with Friends and Family: More than three times a week   Frequency of Social Gatherings with  Friends and Family: More than three times a week   Attends Religious Services: More than 4 times per year   Active Member of Clubs or Organizations: Yes   Attends Music therapist: More than 4 times per year   Marital Status: Married    Tobacco Counseling Counseling given: Not Answered   Clinical Intake:  Pre-visit preparation completed: Yes  Pain : No/denies pain     Nutritional Risks: None Diabetes: No  How often do you need to have someone help you when you read instructions, pamphlets, or other written materials from your doctor or pharmacy?: 1 - Never    Interpreter Needed?: No  Information entered by :: Clemetine Marker LPN   Activities of Daily Living In your present state of health, do you have any difficulty performing the following activities: 03/21/2021 10/29/2020  Hearing? N N  Vision? N N  Difficulty concentrating or making decisions? N N  Walking or climbing stairs? N N  Dressing or bathing? N N  Doing errands, shopping? N N  Preparing Food and eating ? N -  Using the Toilet? N -  In the past six months, have you accidently leaked urine? N -  Do you have problems with loss of bowel  control? N -  Managing your Medications? N -  Managing your Finances? N -  Housekeeping or managing your Housekeeping? N -  Some recent data might be hidden    Patient Care Team: Steele Sizer, MD as PCP - General (Family Medicine) Yolonda Kida, MD as Consulting Physician (Cardiology) Hessie Knows, MD as Consulting Physician (Orthopedic Surgery) Erby Pian, MD as Referring Physician (Specialist)  Indicate any recent Medical Services you may have received from other than [facility] providers in the past year (date may be approximate).     Assessment:   This is a routine wellness examination for [place].  Hearing/Vision screen Hearing Screening - Comments::  Pt denies hearing difficulty Vision Screening - Comments:: Annual vision screenings done at [facility] issues and exercise activities discussed: Current Exercise Habits: The patient does not participate in regular exercise at present, Exercise limited by: orthopedic condition(s)   Goals Addressed             This Visit's Progress    Weight (lb) < 200 lb (90.7 kg)       Pt would like to lose at least 10 lbs this year with healthy eating and physical activity        Depression Screen PHQ 2/9 Scores 03/21/2021 10/29/2020 04/27/2020 03/20/2020 10/26/2019 09/21/2019 04/18/2019  PHQ - 2 Score 0 0 0 0 0 0 0  PHQ- 9 Score - - - - 0 0 0    Fall Risk Fall Risk  03/21/2021 10/29/2020 04/27/2020 03/20/2020 10/26/2019  Falls in the past year? 0 0 0 0 0  Number falls in past yr: 0 0 0 0 0  Comment - - - - -  Injury with Fall? 0 0 0 0 0  Comment - - - - -  Risk for fall due to : No Fall Risks No Fall Risks - No Fall Risks -  Follow up Falls prevention discussed Falls prevention discussed - Falls prevention discussed -    FALL RISK PREVENTION PERTAINING TO THE HOME:  Any stairs in or around the home? Yes  If so, are there any without handrails? No  Home free of loose throw rugs in walkways, pet beds,  electrical cords, etc? Yes  Adequate lighting in your home to reduce risk of falls? Yes   ASSISTIVE DEVICES UTILIZED TO PREVENT FALLS:  Life alert? No  Use of a cane, walker or w/c? Yes  - cane occasionally Grab bars in the bathroom? Yes  Shower chair or bench in shower? Yes  Elevated toilet seat or a handicapped toilet? Yes   TIMED UP AND GO:  Was the test performed? No . Telephonic visit.  Cognitive Function: Normal cognitive status assessed by direct observation by this Nurse Health Advisor. No abnormalities found.       6CIT Screen 03/09/2018  What Year? 0 points  What month? 0 points  What time? 0 points  Count back from 20 0 points  Months in reverse 0 points  Repeat phrase 0 points  Total Score 0    Immunizations Immunization History  Administered Date(s) Administered   Fluad Quad(high Dose 65+) 11/15/2018   Influenza, High Dose Seasonal PF 02/14/2016, 11/12/2016, 01/12/2018, 10/28/2019, 12/03/2020   Influenza,inj,Quad PF,6+ Mos 02/10/2014, 01/29/2015   Influenza-Unspecified 02/10/2014, 01/29/2015, 02/14/2016, 11/12/2016   PFIZER(Purple Top)SARS-COV-2 Vaccination 03/25/2019, 04/15/2019, 12/12/2019, 06/08/2020   Pfizer Covid-19 Vaccine Bivalent Booster 26yrs & up 12/24/2020   Pneumococcal Conjugate-13 08/14/2014   Pneumococcal Polysaccharide-23 01/27/2012   Tdap 12/27/2010, 08/19/2017   Zoster Recombinat (Shingrix) 11/22/2018, 10/28/2019   Zoster, Live 01/01/2011    TDAP status: Up to date  Flu Vaccine status: Up to date  Pneumococcal vaccine status: Up to date  Covid-19 vaccine status: Completed vaccines  Qualifies for Shingles Vaccine? Yes   Zostavax completed Yes   Shingrix Completed?: Yes  Screening Tests Health Maintenance  Topic Date Due   COLONOSCOPY (Pts 45-40yrs Insurance coverage will need to be confirmed)  06/25/2021   MAMMOGRAM  09/13/2022   TETANUS/TDAP  08/20/2027   Pneumonia Vaccine 36+ Years old  Completed   INFLUENZA VACCINE   Completed   DEXA SCAN  Completed   COVID-19 Vaccine  Completed   Hepatitis C Screening  Completed   Zoster Vaccines- Shingrix  Completed   HPV VACCINES  Aged Out    Health Maintenance  There are no preventive care reminders to display for this patient.   Colorectal cancer screening: Type of screening: Colonoscopy. Completed 06/25/16. Repeat every 5 years. Referral sent to [facility] today.   Mammogram status: Completed 09/12/20. Repeat every year  Bone Density status: Completed 09/12/20. Results reflect: Bone density results: NORMAL. Repeat every 2 years.  Lung Cancer Screening: (Low Dose CT Chest recommended if Age 77-80 years, 30 pack-year currently smoking OR have quit w/in 15years.) does not qualify.   Additional Screening:  Hepatitis C Screening: does qualify; Completed 01/27/12  Vision Screening: Recommended annual ophthalmology exams for early detection of glaucoma and other disorders of the eye. Is the patient up to date with their annual eye exam?  Yes  Who is the provider or what is the name of the office in which the patient attends annual eye exams? [place] Screening: Recommended annual dental exams for proper oral hygiene  Community Resource Referral / Chronic Care Management: CRR required this visit?  No   CCM required this visit?  No      Plan:     I have personally reviewed and noted the following in the patients chart:   Medical and social history Use of alcohol, tobacco or illicit drugs  Current medications and supplements including opioid prescriptions.  Functional ability and status Nutritional status Physical activity Advanced directives List of other physicians  Hospitalizations, surgeries, and ER visits in previous 12 months Vitals Screenings to include cognitive, depression, and falls Referrals and appointments  In addition, I have reviewed and discussed with patient certain preventive protocols, quality metrics, and best  practice recommendations. A written personalized care plan for preventive services as well as general preventive health recommendations were provided to patient.   Due to this being a telephonic visit, the after visit summary with patients personalized plan was offered to patient via my-chart.   Clemetine Marker, LPN   [id number]   Nurse Notes: none

## 2021-03-25 ENCOUNTER — Other Ambulatory Visit: Payer: Self-pay

## 2021-03-25 DIAGNOSIS — Z86010 Personal history of colonic polyps: Secondary | ICD-10-CM

## 2021-03-25 MED ORDER — CLENPIQ 10-3.5-12 MG-GM -GM/160ML PO SOLN: 1.0000 | Freq: Once | ORAL | 0 refills | Status: AC

## 2021-03-25 NOTE — Progress Notes (Signed)
Gastroenterology Pre-Procedure Review  Request Date: 04/11/2021 Requesting Physician: Dr. Tobi Bastos  PATIENT REVIEW QUESTIONS: The patient responded to the following health history questions as indicated:    1. Are you having any GI issues? no 2. Do you have a personal history of Polyps? yes (06/2016 polyps removed) 3. Do you have a family history of Colon Cancer or Polyps? no 4. Diabetes Mellitus? no 5. Joint replacements in the past 12 months?yes (right hip 08/2000.) 6. Major health problems in the past 3 months?no 7. Any artificial heart valves, MVP, or defibrillator?no    MEDICATIONS & ALLERGIES:    Patient reports the following regarding taking any anticoagulation/antiplatelet therapy:   Plavix, Coumadin, Eliquis, Xarelto, Lovenox, Pradaxa, Brilinta, or Effient? no Aspirin? yes (81 mg)  Patient confirms/reports the following medications:  Current Outpatient Medications  Medication Sig Dispense Refill   aspirin EC 81 MG tablet Take 1 tablet (81 mg total) by mouth every evening. Swallow whole.     clindamycin (CLEOCIN) 150 MG capsule SMARTSIG:4 Capsule(s) By Mouth Once     olmesartan-hydrochlorothiazide (BENICAR HCT) 40-25 MG tablet TAKE 1 TABLET BY MOUTH  DAILY 90 tablet 1   Polyethyl Glycol-Propyl Glycol (SYSTANE) 0.4-0.3 % SOLN Place 1 drop into both eyes at bedtime.     rosuvastatin (CRESTOR) 10 MG tablet TAKE 1 TABLET BY MOUTH  DAILY 90 tablet 0   No current facility-administered medications for this visit.    Patient confirms/reports the following allergies:  Allergies  Allergen Reactions   Tramadol Nausea And Vomiting   Doxycycline Itching   Penicillins Hives and Rash    leg lesions and weakness (couldn't walk)  Did it involve swelling of the face/tongue/throat, SOB, or low BP? No Did it involve sudden or severe rash/hives, skin peeling, or any reaction on the inside of your mouth or nose? No Did you need to seek medical attention at a hospital or doctor's office?  Yes When did it last happen?      38 years If all above answers are NO, may proceed with cephalosporin use.    No orders of the defined types were placed in this encounter.   AUTHORIZATION INFORMATION Primary Insurance: 1D#: Group #:  Secondary Insurance: 1D#: Group #:  SCHEDULE INFORMATION: Date: 04/11/2021 Time: Location: [facility]

## 2021-03-28 DIAGNOSIS — I498 Other specified cardiac arrhythmias: Secondary | ICD-10-CM | POA: Diagnosis not present

## 2021-03-28 DIAGNOSIS — E669 Obesity, unspecified: Secondary | ICD-10-CM | POA: Diagnosis not present

## 2021-03-28 DIAGNOSIS — I440 Atrioventricular block, first degree: Secondary | ICD-10-CM | POA: Diagnosis not present

## 2021-03-28 DIAGNOSIS — I10 Essential (primary) hypertension: Secondary | ICD-10-CM | POA: Diagnosis not present

## 2021-03-28 DIAGNOSIS — E7800 Pure hypercholesterolemia, unspecified: Secondary | ICD-10-CM | POA: Diagnosis not present

## 2021-03-28 DIAGNOSIS — R9431 Abnormal electrocardiogram [ECG] [EKG]: Secondary | ICD-10-CM | POA: Diagnosis not present

## 2021-03-28 DIAGNOSIS — Z96641 Presence of right artificial hip joint: Secondary | ICD-10-CM | POA: Diagnosis not present

## 2021-03-28 DIAGNOSIS — I208 Other forms of angina pectoris: Secondary | ICD-10-CM | POA: Diagnosis not present

## 2021-03-28 DIAGNOSIS — G4733 Obstructive sleep apnea (adult) (pediatric): Secondary | ICD-10-CM | POA: Diagnosis not present

## 2021-04-03 ENCOUNTER — Other Ambulatory Visit: Payer: Self-pay | Admitting: Family Medicine

## 2021-04-03 DIAGNOSIS — E785 Hyperlipidemia, unspecified: Secondary | ICD-10-CM

## 2021-04-04 ENCOUNTER — Other Ambulatory Visit: Payer: Self-pay

## 2021-04-04 NOTE — Telephone Encounter (Signed)
Requested medication (s) are due for refill today: yes  Requested medication (s) are on the active medication list: yes  Last refill:  01/09/21 #90 and 0 RF  Future visit scheduled: tomorrow, 04/05/21  Notes to clinic:  Please assess at tomorrow visit, mail order requesting one year.      Requested Prescriptions  Pending Prescriptions Disp Refills   rosuvastatin (CRESTOR) 10 MG tablet [Pharmacy Med Name: Rosuvastatin Calcium 10 MG Oral Tablet] 90 tablet 0    Sig: TAKE 1 TABLET BY MOUTH DAILY     Cardiovascular:  Antilipid - Statins 2 Failed - 04/03/2021 11:48 PM      Failed - Lipid Panel in normal range within the last 12 months    Cholesterol, Total  Date Value Ref Range Status  08/15/2015 186 100 - 199 mg/dL Final   Cholesterol  Date Value Ref Range Status  10/29/2020 198 <200 mg/dL Final   LDL Cholesterol (Calc)  Date Value Ref Range Status  10/29/2020 104 (H) mg/dL (calc) Final    Comment:    Reference range: <100 . Desirable range <100 mg/dL for primary prevention;   <70 mg/dL for patients with CHD or diabetic patients  with > or = 2 CHD risk factors. Marland Kitchen LDL-C is now calculated using the Martin-Hopkins  calculation, which is a validated novel method providing  better accuracy than the Friedewald equation in the  estimation of LDL-C.  Horald Pollen et al. Lenox Ahr. 1660;630(16): 2061-2068  (http://education.[employer].com/faq/FAQ164)    HDL  Date Value Ref Range Status  10/29/2020 75 > OR = 50 mg/dL Final  03/11/3233 81 >57 mg/dL Final   Triglycerides  Date Value Ref Range Status  10/29/2020 93 <150 mg/dL Final         Passed - Cr in normal range and within 360 days    Creat  Date Value Ref Range Status  10/26/2019 0.85 0.60 - 0.93 mg/dL Final    Comment:    For patients >40 years of age, the reference limit for Creatinine is approximately 13% higher for people identified as African-American. .    Creatinine, Ser  Date Value Ref Range Status   08/28/2020 0.83 0.44 - 1.00 mg/dL Final          Passed - Patient is not pregnant      Passed - Valid encounter within last 12 months    Recent Outpatient Visits           5 months ago Dyslipidemia   [facility] [name], MD   11 months ago Atypical chest pain   [facility] [name], MD   1 year ago Obesity (BMI 30-39.9)   [facility] [name], MD   1 year ago Dysuria   [facility] Jamelle Haring, MD   1 year ago Benign essential HTN   [facility] [name], MD       Future Appointments             Tomorrow [name], MD [facility]   In 11 months  [facility]

## 2021-04-04 NOTE — Progress Notes (Signed)
Name: Lisa Steele   MRN: [id number]    DOB: October 27, 1945   Date:04/05/2021       Progress Note  Subjective  Chief Complaint  Follow Up  HPI  HTN: taking bp medication daily. She denies SOB , palpitation or  dizziness. No side effects of medication . BP is at goal , continue Benicar hctz   Chest pain: she was seen by Dr. Juliann Pares and had negative stress test, she recently went back for follow up, but doing well and no longer has chest pain    Hyperlipidemia: taking Crestor, no myalgias, compliant with medications. Last LDL had gone up but she would like to hold off until next visit to recheck labs   OSA: she sees Dr. Meredeth Ide, she is back on full mask and tolerating it well. She has been feeling better when she wakes up. Nocturia has improved since she started to wear CPAP machine   Morbid obesity: BMI above 35 with multiple co-morbidities, such as dyslipidemia , HTN and OA   History of right hip replacement : 06/2019, on follow up noticed to have heterotopic ossification, she went back for a revision 08/30/20 and had radiation to prevent recurrence done by [facility] July 1 st 2022 She had PT, she has been using a cane when she is out of the house ,she is going to stretch zone in [facility] and has really helped with her stiffness. 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   Follow up 10/25/2019   Primary localized osteoarthritis 06/18/2019   Status post total hip replacement, right 06/16/2019   Trigger finger of left thumb 03/04/2018   Left hand pain 02/03/2018   S/P rotator cuff repair 01/12/2018   Injury of left rotator cuff 11/03/2017   Obesity (BMI 30.0-34.9) 02/19/2017    Primary osteoarthritis of both knees 06/22/2014   Obesity (BMI 30-39.9) 06/22/2014   Dysmetabolic syndrome 06/22/2014   Post-menopausal 06/22/2014   H/O cold sores 06/22/2014   Dyslipidemia 06/22/2014   Elevated fasting blood sugar 06/22/2014   Benign essential HTN 06/22/2014   Abnormal ECG 06/22/2014    Past Surgical History:  Procedure Laterality Date   APPENDECTOMY     BREAST EXCISIONAL BIOPSY Left 1970   neg   BREAST SURGERY Left    cyst removed   BUNIONECTOMY Left 2008   CATARACT EXTRACTION, BILATERAL  10/2013   COLONOSCOPY     2005, 2009, 2013   COLONOSCOPY WITH PROPOFOL N/A 06/25/2016   Procedure: COLONOSCOPY WITH PROPOFOL;  Surgeon: Scot Jun, MD;  Location: [facility] ENDOSCOPY;  Service: Endoscopy;  Laterality: N/A;   ECTOPIC PREGNANCY SURGERY  1978   EYE SURGERY     JOINT REPLACEMENT     SHOULDER ARTHROSCOPY WITH ROTATOR CUFF REPAIR AND SUBACROMIAL DECOMPRESSION Left 11/25/2017   Procedure: SHOULDER ARTHROSCOPic release of long head biceps tendon and mini open rotator cuff repair;  Surgeon: Erin Sons, MD;  Location: [facility] ORS;  Service: Orthopedics;  Laterality: Left;   TONSILECTOMY, ADENOIDECTOMY, BILATERAL MYRINGOTOMY AND TUBES     TONSILLECTOMY     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   Procedure: TOTAL HIP ARTHROPLASTY ANTERIOR APPROACH;  Surgeon: Kennedy Bucker, MD;  Location: [facility] ORS;  Service: Orthopedics;  Laterality: Right;   TOTAL HIP ARTHROPLASTY Right 08/30/2020   Procedure: Right hip heterotopic ossification removal;  Surgeon: Kennedy Bucker, MD;  Location: [facility] ORS;  Service: Orthopedics;  Laterality: Right;   TUBAL LIGATION      Family History  Problem Relation Age of Onset   Diabetes Mother    Hypertension Mother    Heart disease Mother    Hyperlipidemia Mother    Heart disease Father    Diabetes Sister    Heart disease Sister    Hypertension Sister    Hyperlipidemia Sister    Breast cancer Sister 80   Cancer Sister 44        breast   Breast cancer Maternal Aunt    Diabetes Sister    Stroke Sister    Diabetes Sister    Diabetes Sister     Social History   Tobacco Use   Smoking status: Former    Years: 10.00    Types: Cigarettes    Start date: 03/03/1958   Smokeless tobacco: Never  Substance Use Topics   Alcohol use: Yes    Alcohol/week: 0.0 standard drinks    Comment: socially     Current Outpatient Medications:    aspirin EC 81 MG tablet, Take 1 tablet (81 mg total) by mouth every evening. Swallow whole., Disp: , Rfl:    CLENPIQ 10-3.5-12 MG-GM -GM/160ML SOLN, Take by mouth once., Disp: , Rfl:    clindamycin (CLEOCIN) 150 MG capsule, SMARTSIG:4 Capsule(s) By Mouth Once, Disp: , Rfl:    olmesartan-hydrochlorothiazide (BENICAR HCT) 40-25 MG tablet, TAKE 1 TABLET BY MOUTH  DAILY, Disp: 90 tablet, Rfl: 1   Polyethyl Glycol-Propyl Glycol (SYSTANE) 0.4-0.3 % SOLN, Place 1 drop into both eyes at bedtime., Disp: , Rfl:    rosuvastatin (CRESTOR) 10 MG tablet, TAKE 1 TABLET BY MOUTH  DAILY, Disp: 90 tablet, Rfl: 0  Allergies  Allergen Reactions   Tramadol Nausea And Vomiting   Doxycycline Itching   Penicillins Hives and Rash    leg lesions and weakness (couldn't walk)  Did it involve swelling of the face/tongue/throat, SOB, or low BP? No Did it involve sudden or severe rash/hives, skin peeling, or any reaction on the inside of your mouth or nose? No Did you need to seek medical attention at a hospital or doctor's office? Yes When did it last happen?      38 years If all above answers are NO, may proceed with cephalosporin use.    I personally reviewed active problem list, medication list, allergies, family history, social history, health maintenance with the patient/caregiver today.   ROS  Constitutional: Negative for fever or weight change.  Respiratory: Negative for cough and shortness of breath.   Cardiovascular: Negative for chest pain or palpitations.  Gastrointestinal: Negative for  abdominal pain, no bowel changes.  Musculoskeletal: Negative for gait problem or joint swelling.  Skin: Negative for rash.  Neurological: Negative for dizziness or headache.  No other specific complaints in a complete review of systems (except as listed in HPI above).   Objective  Vitals:   04/05/21 1046  BP: 128/66  Pulse: 80  Resp: 16  Temp: 97.7 F (36.5 C)  TempSrc: Oral  SpO2: 98%  Weight: 203 lb 14.4 oz (92.5 kg)  Height: 5\' 3"  (1.6 m)    Body mass index is 36.12 kg/m.  Physical Exam  Constitutional: Patient appears well-developed and well-nourished. Obese  No distress.  HEENT: head atraumatic, normocephalic,  pupils equal and reactive to light, neck supple Cardiovascular: Normal rate, regular rhythm and normal heart sounds.  No murmur heard. No BLE edema. Breast Exam: small white scab over the right nipple, removed and expression of the nipple/breast did not produce any drainage. No lumps, redness or pain  Pulmonary/Chest: Effort normal and breath sounds normal. No respiratory distress. Abdominal: Soft.  There is no tenderness. Psychiatric: Patient has a normal mood and affect. behavior is normal. Judgment and thought content normal.   PHQ2/9: Depression screen [facility] 2/9 04/05/2021 03/21/2021 10/29/2020 04/27/2020 03/20/2020  Decreased Interest 0 0 0 0 0  Down, Depressed, Hopeless 0 0 0 0 0  PHQ - 2 Score 0 0 0 0 0  Altered sleeping 0 - - - -  Tired, decreased energy 0 - - - -  Change in appetite 0 - - - -  Feeling bad or failure about yourself  0 - - - -  Trouble concentrating 0 - - - -  Moving slowly or fidgety/restless 0 - - - -  Suicidal thoughts 0 - - - -  PHQ-9 Score 0 - - - -  Difficult doing work/chores Not difficult at all - - - -  Some recent data might be hidden    phq 9 is negative   Fall Risk: Fall Risk  04/05/2021 03/21/2021 10/29/2020 04/27/2020 03/20/2020  Falls in the past year? 0 0 0 0 0  Number falls in past yr: 0 0 0 0 0  Comment - - - - -  Injury  with Fall? 0 0 0 0 0  Comment - - - - -  Risk for fall due to : No Fall Risks No Fall Risks No Fall Risks - No Fall Risks  Follow up Falls prevention discussed Falls prevention discussed Falls prevention discussed - Falls prevention discussed      Functional Status Survey: Is the patient deaf or have difficulty hearing?: No Does the patient have difficulty seeing, even when wearing glasses/contacts?: No Does the patient have difficulty concentrating, remembering, or making decisions?: No Does the patient have difficulty walking or climbing stairs?: No Does the patient have difficulty dressing or bathing?: No Does the patient have difficulty doing errands alone such as visiting a doctor's office or shopping?: No    Assessment & Plan  1. Benign essential HTN  - olmesartan-hydrochlorothiazide (BENICAR HCT) 40-25 MG tablet; Take 1 tablet by mouth daily.  Dispense: 90 tablet; Refill: 1  2. Morbid obesity (HCC)  Discussed with the patient the risk posed by an increased BMI. Discussed importance of portion control, calorie counting and at least 150 minutes of physical activity weekly. Avoid sweet beverages and drink more water. Eat at least 6 servings of fruit and vegetables daily    3. Dyslipidemia  - rosuvastatin (CRESTOR) 10 MG tablet; Take 1 tablet (10 mg total) by mouth daily.  Dispense: 90 tablet; Refill: 1  4. OSA on CPAP   5. Atypical chest pain  - aspirin EC 81 MG tablet; Take 1 tablet (81 mg total) by mouth every evening. Swallow whole.  Dispense: 90 tablet; Refill: 3

## 2021-04-05 ENCOUNTER — Ambulatory Visit (INDEPENDENT_AMBULATORY_CARE_PROVIDER_SITE_OTHER): Payer: Medicare Other | Admitting: Family Medicine

## 2021-04-05 ENCOUNTER — Encounter: Payer: Self-pay | Admitting: Family Medicine

## 2021-04-05 VITALS — BP 128/66 | HR 80 | Temp 97.7°F | Resp 16 | Ht 63.0 in | Wt 203.9 lb

## 2021-04-05 DIAGNOSIS — Z9989 Dependence on other enabling machines and devices: Secondary | ICD-10-CM

## 2021-04-05 DIAGNOSIS — R0789 Other chest pain: Secondary | ICD-10-CM

## 2021-04-05 DIAGNOSIS — I10 Essential (primary) hypertension: Secondary | ICD-10-CM | POA: Diagnosis not present

## 2021-04-05 DIAGNOSIS — G4733 Obstructive sleep apnea (adult) (pediatric): Secondary | ICD-10-CM

## 2021-04-05 DIAGNOSIS — E785 Hyperlipidemia, unspecified: Secondary | ICD-10-CM | POA: Diagnosis not present

## 2021-04-05 MED ORDER — ROSUVASTATIN CALCIUM 10 MG PO TABS: 10.0000 mg | ORAL_TABLET | Freq: Every day | ORAL | 1 refills | Status: DC

## 2021-04-05 MED ORDER — OLMESARTAN MEDOXOMIL-HCTZ 40-25 MG PO TABS: 1.0000 | ORAL_TABLET | Freq: Every day | ORAL | 1 refills | Status: DC

## 2021-04-05 MED ORDER — ASPIRIN EC 81 MG PO TBEC: 81.0000 mg | DELAYED_RELEASE_TABLET | Freq: Every evening | ORAL | 3 refills | Status: DC

## 2021-04-06 ENCOUNTER — Encounter: Payer: Self-pay | Admitting: Family Medicine

## 2021-04-11 ENCOUNTER — Ambulatory Visit: Payer: Medicare Other | Admitting: Certified Registered"

## 2021-04-11 ENCOUNTER — Encounter
Admission: RE | Disposition: A | Discharge status: Home / Self Care | Payer: Self-pay | Source: Home / Self Care | Attending: Gastroenterology

## 2021-04-11 ENCOUNTER — Other Ambulatory Visit: Payer: Self-pay

## 2021-04-11 ENCOUNTER — Encounter: Payer: Self-pay | Admitting: Gastroenterology

## 2021-04-11 ENCOUNTER — Ambulatory Visit
Admission: RE | Admit: 2021-04-11 | Discharge: 2021-04-11 | Disposition: A | Discharge status: Home / Self Care | Payer: Medicare Other | Attending: Gastroenterology | Admitting: Gastroenterology

## 2021-04-11 DIAGNOSIS — Z87891 Personal history of nicotine dependence: Secondary | ICD-10-CM | POA: Insufficient documentation

## 2021-04-11 DIAGNOSIS — Z860100 Personal history of colon polyps, unspecified: Secondary | ICD-10-CM

## 2021-04-11 DIAGNOSIS — K635 Polyp of colon: Secondary | ICD-10-CM | POA: Diagnosis not present

## 2021-04-11 DIAGNOSIS — I10 Essential (primary) hypertension: Secondary | ICD-10-CM | POA: Diagnosis not present

## 2021-04-11 DIAGNOSIS — E669 Obesity, unspecified: Secondary | ICD-10-CM | POA: Diagnosis not present

## 2021-04-11 DIAGNOSIS — E785 Hyperlipidemia, unspecified: Secondary | ICD-10-CM | POA: Insufficient documentation

## 2021-04-11 DIAGNOSIS — K219 Gastro-esophageal reflux disease without esophagitis: Secondary | ICD-10-CM | POA: Diagnosis not present

## 2021-04-11 DIAGNOSIS — I440 Atrioventricular block, first degree: Secondary | ICD-10-CM | POA: Diagnosis not present

## 2021-04-11 DIAGNOSIS — Z6835 Body mass index (BMI) 35.0-35.9, adult: Secondary | ICD-10-CM | POA: Insufficient documentation

## 2021-04-11 DIAGNOSIS — Z79899 Other long term (current) drug therapy: Secondary | ICD-10-CM | POA: Insufficient documentation

## 2021-04-11 DIAGNOSIS — Z1211 Encounter for screening for malignant neoplasm of colon: Secondary | ICD-10-CM | POA: Diagnosis not present

## 2021-04-11 DIAGNOSIS — G4730 Sleep apnea, unspecified: Secondary | ICD-10-CM | POA: Diagnosis not present

## 2021-04-11 DIAGNOSIS — K5730 Diverticulosis of large intestine without perforation or abscess without bleeding: Secondary | ICD-10-CM | POA: Diagnosis not present

## 2021-04-11 HISTORY — PX: COLONOSCOPY WITH PROPOFOL: SHX5780

## 2021-04-11 SURGERY — COLONOSCOPY WITH PROPOFOL
Anesthesia: General

## 2021-04-11 MED ORDER — LIDOCAINE HCL (PF) 1 % IJ SOLN
INTRAMUSCULAR | Status: AC
  Administered 2021-04-11: 0.2 mL
  Filled 2021-04-11: qty 2

## 2021-04-11 MED ORDER — PROPOFOL 500 MG/50ML IV EMUL
INTRAVENOUS | Status: DC | PRN
  Administered 2021-04-11: 150 ug/kg/min via INTRAVENOUS
  Administered 2021-04-11: 80 ug/kg/min via INTRAVENOUS

## 2021-04-11 MED ORDER — PROPOFOL 10 MG/ML IV BOLUS
INTRAVENOUS | Status: DC | PRN
  Administered 2021-04-11: 70 mg via INTRAVENOUS

## 2021-04-11 MED ORDER — PROPOFOL 10 MG/ML IV BOLUS
INTRAVENOUS | Status: AC
  Filled 2021-04-11: qty 20

## 2021-04-11 MED ORDER — LIDOCAINE HCL (CARDIAC) PF 100 MG/5ML IV SOSY
PREFILLED_SYRINGE | INTRAVENOUS | Status: DC | PRN
  Administered 2021-04-11: 50 mg via INTRAVENOUS

## 2021-04-11 MED ORDER — SODIUM CHLORIDE 0.9 % IV SOLN: INTRAVENOUS | Status: DC

## 2021-04-11 NOTE — Op Note (Signed)
[facility] Gastroenterology Patient Name: Lisa Steele Procedure Date: 04/11/2021 9:10 AM MRN: [id number] Account #: [id number] Date of Birth: 1945/08/26 Admit Type: Outpatient Age: 76 Room: [facility] ENDO ROOM 3 Gender: Female Note Status: Finalized Instrument Name: Prentice Docker [id number] Procedure:             Colonoscopy Indications:           Surveillance: Personal history of adenomatous polyps                         on last colonoscopy > 5 years ago, Last colonoscopy:                         April 2018 Providers:             Wyline Mood MD, MD Referring MD:          Onnie Boer. Sowles, MD (Referring MD) Medicines:             Monitored Anesthesia Care Complications:         No immediate complications. Procedure:             Pre-Anesthesia Assessment:                        - Prior to the procedure, a History and Physical was                         performed, and patient medications, allergies and                         sensitivities were reviewed. The patient's tolerance                         of previous anesthesia was reviewed.                        - The risks and benefits of the procedure and the                         sedation options and risks were discussed with the                         patient. All questions were answered and informed                         consent was obtained.                        - ASA Grade Assessment: II - A patient with mild                         systemic disease.                        After obtaining informed consent, the colonoscope was                         passed under direct vision. Throughout the procedure,                         the patient's  blood pressure, pulse, and oxygen                         saturations were monitored continuously. The                         Colonoscope was introduced through the anus and                         advanced to the the cecum, identified by the                          appendiceal orifice. The colonoscopy was performed                         with ease. The patient tolerated the procedure well.                         The quality of the bowel preparation was excellent. Findings:      The perianal and digital rectal examinations were normal.      A 3 mm polyp was found in the ascending colon. The polyp was sessile.       The polyp was removed with a cold snare. Resection and retrieval were       complete.      Multiple small-mouthed diverticula were found in the sigmoid colon.      The exam was otherwise without abnormality on direct and retroflexion       views. Impression:            - One 3 mm polyp in the ascending colon, removed with                         a cold snare. Resected and retrieved.                        - Diverticulosis in the sigmoid colon.                        - The examination was otherwise normal on direct and                         retroflexion views. Recommendation:        - Discharge patient to home (with escort).                        - Resume previous diet.                        - Continue present medications.                        - Await pathology results.                        - Repeat colonoscopy is not recommended due to current                         age (28 years or older) for surveillance. Procedure Code(s):     --- Professional ---  02585, Colonoscopy, flexible; with removal of                         tumor(s), polyp(s), or other lesion(s) by snare                         technique Diagnosis Code(s):     --- Professional ---                        Z86.010, Personal history of colonic polyps                        K63.5, Polyp of colon                        K57.30, Diverticulosis of large intestine without                         perforation or abscess without bleeding CPT copyright 2019 American Medical Association. All rights reserved. The codes documented in this report are  preliminary and upon coder review may  be revised to meet current compliance requirements. Wyline Mood, MD Wyline Mood MD, MD 04/11/2021 9:35:32 AM This report has been signed electronically. Number of Addenda: 0 Note Initiated On: 04/11/2021 9:10 AM Scope Withdrawal Time: 0 hours 12 minutes 19 seconds  Total Procedure Duration: 0 hours 14 minutes 42 seconds  Estimated Blood Loss:  Estimated blood loss: none.      [facility]

## 2021-04-11 NOTE — Anesthesia Postprocedure Evaluation (Signed)
Anesthesia Post Note  Patient: Lisa Steele  Procedure(s) Performed: COLONOSCOPY WITH PROPOFOL  Patient location during evaluation: Endoscopy Anesthesia Type: General Level of consciousness: awake and alert Pain management: pain level controlled Vital Signs Assessment: post-procedure vital signs reviewed and stable Respiratory status: spontaneous breathing, nonlabored ventilation, respiratory function stable and patient connected to nasal cannula oxygen Cardiovascular status: blood pressure returned to baseline and stable Postop Assessment: no apparent nausea or vomiting Anesthetic complications: no   No notable events documented.   Last Vitals:  Vitals:   04/11/21 0941 04/11/21 1000  BP: (!) 103/54 (!) 120/58  Pulse: 74 (!) 56  Resp: 19 14  Temp:    SpO2: 98% 100%    Last Pain:  Vitals:   04/11/21 0936  TempSrc: Temporal  PainSc:                  Cleda Mccreedy Armanii Urbanik

## 2021-04-11 NOTE — H&P (Signed)
Lisa Mood, MD [address], [address], [place], [place], [address] [address], [address], [place], [place], [address] Phone: [phone]  Fax: [phone]  Primary Care Physician:  [name], MD   Pre-Procedure History & Physical: HPI:  Lisa Steele is a 76 y.o. female is here for an colonoscopy.   Past Medical History:  Diagnosis Date   1st degree AV block    Cataracts, bilateral    DJD (degenerative joint disease)    Equivalent angina (HCC)    Fluttering heart    Heterotopic ossification    RIGHT hip   Hyperlipidemia    Hypertension    Obesity    Osteoarthritis    Sleep apnea    wears CPAP - Dr. Meredeth Ide    Past Surgical History:  Procedure Laterality Date   APPENDECTOMY     BREAST EXCISIONAL BIOPSY Left 1970   neg   BREAST SURGERY Left    cyst removed   BUNIONECTOMY Left 2008   CATARACT EXTRACTION, BILATERAL  10/2013   COLONOSCOPY     2005, 2009, 2013   COLONOSCOPY WITH PROPOFOL N/A 06/25/2016   Procedure: COLONOSCOPY WITH PROPOFOL;  Surgeon: Scot Jun, MD;  Location: [facility] ENDOSCOPY;  Service: Endoscopy;  Laterality: N/A;   ECTOPIC PREGNANCY SURGERY  1978   EYE SURGERY     JOINT REPLACEMENT     SHOULDER ARTHROSCOPY WITH ROTATOR CUFF REPAIR AND SUBACROMIAL DECOMPRESSION Left 11/25/2017   Procedure: SHOULDER ARTHROSCOPic release of long head biceps tendon and mini open rotator cuff repair;  Surgeon: Erin Sons, MD;  Location: [facility] ORS;  Service: Orthopedics;  Laterality: Left;   TONSILECTOMY, ADENOIDECTOMY, BILATERAL MYRINGOTOMY AND TUBES     TONSILLECTOMY     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   Procedure: TOTAL HIP ARTHROPLASTY ANTERIOR APPROACH;  Surgeon: Kennedy Bucker, MD;  Location: [facility] ORS;  Service: Orthopedics;  Laterality: Right;   TOTAL HIP ARTHROPLASTY Right 08/30/2020   Procedure: Right hip heterotopic ossification removal;  Surgeon: Kennedy Bucker, MD;  Location: [facility] ORS;  Service: Orthopedics;  Laterality: Right;    TUBAL LIGATION      Prior to Admission medications   Medication Sig Start Date End Date Taking? Authorizing Provider  aspirin EC 81 MG tablet Take 1 tablet (81 mg total) by mouth every evening. Swallow whole. 04/05/21  Yes Sowles, Danna Hefty, MD  olmesartan-hydrochlorothiazide (BENICAR HCT) 40-25 MG tablet Take 1 tablet by mouth daily. 04/05/21  Yes Sowles, Danna Hefty, MD  Polyethyl Glycol-Propyl Glycol (SYSTANE) 0.4-0.3 % SOLN Place 1 drop into both eyes at bedtime.   Yes [provider]  rosuvastatin (CRESTOR) 10 MG tablet Take 1 tablet (10 mg total) by mouth daily. 04/05/21  Yes Sowles, Danna Hefty, MD  CLENPIQ 10-3.5-12 MG-GM -GM/160ML SOLN Take by mouth once. Patient not taking: Reported on 04/11/2021 03/26/21   [provider]    Allergies as of 03/25/2021 - Review Complete 03/21/2021  Allergen Reaction Noted   Tramadol Nausea And Vomiting 11/25/2017   Doxycycline Itching 08/15/2016   Penicillins Hives and Rash 06/22/2014    Family History  Problem Relation Age of Onset   Diabetes Mother    Hypertension Mother    Heart disease Mother    Hyperlipidemia Mother    Heart disease Father    Diabetes Sister    Heart disease Sister    Hypertension Sister    Hyperlipidemia Sister    Breast cancer Sister 75   Cancer Sister 62       breast   Breast  cancer Maternal Aunt    Diabetes Sister    Stroke Sister    Diabetes Sister    Diabetes Sister     Social History   Socioeconomic History   Marital status: Married    Spouse name: Lisa Steele    Number of children: 1   Years of education: Not on file   Highest education level: Associate degree: academic program  Occupational History   Occupation: Theatre stage manager     Comment: [employer]   Tobacco Use   Smoking status: Former    Years: 10.00    Types: Cigarettes    Start date: 03/03/1958    Quit date: 1980    Years since quitting: 43.1   Smokeless tobacco: Never  Vaping Use   Vaping Use: Never used   Substance and Sexual Activity   Alcohol use: Yes    Alcohol/week: 0.0 standard drinks    Comment: socially   Drug use: No   Sexual activity: Not Currently    Comment: husband has ED  Other Topics Concern   Not on file  Social History Narrative   Married, son lives in [place] and has 3 grandchildren , youngest born 10/2016   Social Determinants of Health   Financial Resource Strain: Low Risk    Difficulty of Paying Living Expenses: Not hard at all  Food Insecurity: No Food Insecurity   Worried About Programme researcher, broadcasting/film/video in the Last Year: Never true   Barista in the Last Year: Never true  Transportation Needs: No Transportation Needs   Lack of Transportation (Medical): No   Lack of Transportation (Non-Medical): No  Physical Activity: Inactive   Days of Exercise per Week: 0 days   Minutes of Exercise per Session: 0 min  Stress: No Stress Concern Present   Feeling of Stress : Not at all  Social Connections: Socially Integrated   Frequency of Communication with Friends and Family: More than three times a week   Frequency of Social Gatherings with Friends and Family: More than three times a week   Attends Religious Services: More than 4 times per year   Active Member of [employer] or Organizations: Yes   Attends Engineer, structural: More than 4 times per year   Marital Status: Married  Catering manager Violence: Not At Risk   Fear of Current or Ex-Partner: No   Emotionally Abused: No   Physically Abused: No   Sexually Abused: No    Review of Systems: See HPI, otherwise negative ROS  Physical Exam: BP (!) 151/71    Pulse 72    Temp (!) 96.6 F (35.9 C) (Temporal)    Resp 18    Ht 5\' 3"  (1.6 m)    Wt 89.8 kg    SpO2 99%    BMI 35.07 kg/m  General:   Alert,  pleasant and cooperative in NAD Head:  Normocephalic and atraumatic. Neck:  Supple; no masses or thyromegaly. Lungs:  Clear throughout to auscultation, normal respiratory effort.    Heart:  +S1, +S2,  Regular rate and rhythm, No edema. Abdomen:  Soft, nontender and nondistended. Normal bowel sounds, without guarding, and without rebound.   Neurologic:  Alert and  oriented x4;  grossly normal neurologically.  Impression/Plan: Isamara Almer is here for an colonoscopy to be performed for surveillance due to prior history of colon polyps   Risks, benefits, limitations, and alternatives regarding  colonoscopy have been reviewed with the patient.  Questions have been answered.  All parties agreeable.   Lisa Mood, MD  04/11/2021, 9:00 AM

## 2021-04-11 NOTE — Anesthesia Procedure Notes (Signed)
Procedure Name: MAC Date/Time: 04/11/2021 9:17 AM Performed by: Biagio Borg, CRNA Pre-anesthesia Checklist: Patient identified, Emergency Drugs available, Suction available, Patient being monitored and Timeout performed Patient Re-evaluated:Patient Re-evaluated prior to induction Oxygen Delivery Method: Nasal cannula Induction Type: IV induction Placement Confirmation: positive ETCO2 and CO2 detector

## 2021-04-11 NOTE — Anesthesia Preprocedure Evaluation (Signed)
Anesthesia Evaluation  Patient identified by MRN, date of birth, ID band Patient awake    Reviewed: Allergy & Precautions, NPO status , Patient's Chart, lab work & pertinent test results  History of Anesthesia Complications Negative for: history of anesthetic complications  Airway Mallampati: III  TM Distance: >3 FB Neck ROM: full    Dental  (+) Chipped   Pulmonary neg shortness of breath, sleep apnea , former smoker,    Pulmonary exam normal        Cardiovascular Exercise Tolerance: Good hypertension, (-) anginaNormal cardiovascular exam+ dysrhythmias      Neuro/Psych negative neurological ROS  negative psych ROS   GI/Hepatic negative GI ROS, Neg liver ROS, neg GERD  ,  Endo/Other  negative endocrine ROS  Renal/GU negative Renal ROS  negative genitourinary   Musculoskeletal  (+) Arthritis ,   Abdominal   Peds  Hematology negative hematology ROS (+)   Anesthesia Other Findings Past Medical History: No date: 1st degree AV block No date: Cataracts, bilateral No date: DJD (degenerative joint disease) No date: Equivalent angina (HCC) No date: Fluttering heart No date: Heterotopic ossification     Comment:  RIGHT hip No date: Hyperlipidemia No date: Hypertension No date: Obesity No date: Osteoarthritis No date: Sleep apnea     Comment:  wears CPAP - Dr. Meredeth Ide  Past Surgical History: No date: APPENDECTOMY 1970: BREAST EXCISIONAL BIOPSY; Left     Comment:  neg No date: BREAST SURGERY; Left     Comment:  cyst removed 2008: BUNIONECTOMY; Left 10/2013: CATARACT EXTRACTION, BILATERAL No date: COLONOSCOPY     Comment:  2005, 2009, 2013 06/25/2016: COLONOSCOPY WITH PROPOFOL; N/A     Comment:  Procedure: COLONOSCOPY WITH PROPOFOL;  Surgeon: Scot Jun, MD;  Location: [facility] ENDOSCOPY;  Service:               Endoscopy;  Laterality: N/A; 1978: ECTOPIC PREGNANCY SURGERY No date: EYE  SURGERY No date: JOINT REPLACEMENT 11/25/2017: SHOULDER ARTHROSCOPY WITH ROTATOR CUFF REPAIR AND  SUBACROMIAL DECOMPRESSION; Left     Comment:  Procedure: SHOULDER ARTHROSCOPic release of long head               biceps tendon and mini open rotator cuff repair;                Surgeon: Erin Sons, MD;  Location: [facility] ORS;                Service: Orthopedics;  Laterality: Left; No date: TONSILECTOMY, ADENOIDECTOMY, BILATERAL MYRINGOTOMY AND TUBES No date: TONSILLECTOMY 06/16/2019: TOTAL HIP ARTHROPLASTY; Right     Comment:  Procedure: TOTAL HIP ARTHROPLASTY ANTERIOR APPROACH;                Surgeon: Kennedy Bucker, MD;  Location: [facility] ORS;                Service: Orthopedics;  Laterality: Right; 08/30/2020: TOTAL HIP ARTHROPLASTY; Right     Comment:  Procedure: Right hip heterotopic ossification removal;                Surgeon: Kennedy Bucker, MD;  Location: [facility] ORS;                Service: Orthopedics;  Laterality: Right; No date: TUBAL LIGATION  BMI    Body Mass Index: 35.07 kg/m      Reproductive/Obstetrics negative OB ROS  Anesthesia Physical Anesthesia Plan  ASA: 3  Anesthesia Plan: General   Post-op Pain Management:    Induction: Intravenous  PONV Risk Score and Plan: Propofol infusion and TIVA  Airway Management Planned: Natural Airway and Nasal Cannula  Additional Equipment:   Intra-op Plan:   Post-operative Plan:   Informed Consent: I have reviewed the patients History and Physical, chart, labs and discussed the procedure including the risks, benefits and alternatives for the proposed anesthesia with the patient or authorized representative who has indicated his/her understanding and acceptance.     Dental Advisory Given  Plan Discussed with: Anesthesiologist, CRNA and Surgeon  Anesthesia Plan Comments: (Patient consented for risks of anesthesia including but not limited to:  - adverse reactions to  medications - risk of airway placement if required - damage to eyes, teeth, lips or other oral mucosa - nerve damage due to positioning  - sore throat or hoarseness - Damage to heart, brain, nerves, lungs, other parts of body or loss of life  Patient voiced understanding.)        Anesthesia Quick Evaluation

## 2021-04-11 NOTE — Transfer of Care (Signed)
Immediate Anesthesia Transfer of Care Note  Patient: Lisa Steele  Procedure(s) Performed: COLONOSCOPY WITH PROPOFOL  Patient Location: PACU and Endoscopy Unit  Anesthesia Type:General  Level of Consciousness: awake  Airway & Oxygen Therapy: Patient Spontanous Breathing  Post-op Assessment: Report given to RN and Post -op Vital signs reviewed and stable  Post vital signs: Reviewed and stable  Last Vitals:  Vitals Value Taken Time  BP 87/67 04/11/21 0939  Temp 35.9 C 04/11/21 0936  Pulse 79 04/11/21 0940  Resp 25 04/11/21 0940  SpO2 99 % 04/11/21 0940  Vitals shown include unvalidated device data.  Last Pain:  Vitals:   04/11/21 0936  TempSrc: Temporal  PainSc:          Complications: No notable events documented.

## 2021-04-12 ENCOUNTER — Encounter: Payer: Self-pay | Admitting: Gastroenterology

## 2021-04-12 LAB — SURGICAL PATHOLOGY

## 2021-04-17 DIAGNOSIS — M160 Bilateral primary osteoarthritis of hip: Secondary | ICD-10-CM | POA: Diagnosis not present

## 2021-04-17 DIAGNOSIS — M9689 Other intraoperative and postprocedural complications and disorders of the musculoskeletal system: Secondary | ICD-10-CM | POA: Diagnosis not present

## 2021-04-17 DIAGNOSIS — Z96641 Presence of right artificial hip joint: Secondary | ICD-10-CM | POA: Diagnosis not present

## 2021-04-17 DIAGNOSIS — M6150 Other ossification of muscle, unspecified site: Secondary | ICD-10-CM | POA: Diagnosis not present

## 2021-04-30 ENCOUNTER — Ambulatory Visit: Payer: Medicare Other | Admitting: Family Medicine

## 2021-05-02 DIAGNOSIS — Z961 Presence of intraocular lens: Secondary | ICD-10-CM | POA: Diagnosis not present

## 2021-07-11 ENCOUNTER — Ambulatory Visit (LOCAL_COMMUNITY_HEALTH_CENTER): Payer: Medicare Other

## 2021-07-11 DIAGNOSIS — Z719 Counseling, unspecified: Secondary | ICD-10-CM

## 2021-07-11 DIAGNOSIS — Z23 Encounter for immunization: Secondary | ICD-10-CM | POA: Diagnosis not present

## 2021-07-11 NOTE — Progress Notes (Signed)
?  Are you feeling sick today? No ? ? ?Have you ever received a dose of COVID-19 Vaccine? [employer], [place], [place], [place], Other) Yes ? ?If yes, which vaccine and how many doses?   5 doses Pfizer ? ? ?Did you bring the vaccination record card or other documentation?  Yes ? ? ?Do you have a health condition or are undergoing treatment that makes you moderately or severely immunocompromised? This would include, but not be limited to: cancer, HIV, organ transplant, immunosuppressive therapy/high-dose corticosteroids, or moderate/severe primary immunodeficiency.  No ? ?Have you received COVID-19 vaccine before or during hematopoietic cell transplant (HCT) or CAR-T-cell therapies? No ? ?Have you ever had an allergic reaction to: (This would include a severe allergic reaction or a reaction that caused hives, swelling, or respiratory distress, including wheezing.) A component of a COVID-19 vaccine or a previous dose of COVID-19 vaccine? No ? ? ?Have you ever had an allergic reaction to another vaccine (other thanCOVID-19 vaccine) or an injectable medication? (This would include a severe allergic reaction or a reaction that caused hives, swelling, or respiratory distress, including wheezing.)   No ?  ?Do you have a history of any of the following: ? ?Myocarditis or Pericarditis No ?Thrombosis with thrombocytopenia syndrome (TTS) No ?Multisystem Inflammatory Syndrome (MIS-C or MIS-A)? No ?Immune-mediate syndrome defined by thrombosis and thrombocytopenia, such as heparin--induced thrombocytopenia (HIT)  No ?Guillain-Barr? Syndrome (GBS) No ?COVID-19 disease within the past 3 months? No ?Vaccinated with monkeypox vaccine in the last 4 weeks? No ? ?Pfizer BV administered in left deltoid. Tolerated well. COVID card updated and [facility] updated and provided to patient.  ?  ?

## 2021-07-22 DIAGNOSIS — M25562 Pain in left knee: Secondary | ICD-10-CM | POA: Diagnosis not present

## 2021-07-22 DIAGNOSIS — Z96641 Presence of right artificial hip joint: Secondary | ICD-10-CM | POA: Diagnosis not present

## 2021-07-22 DIAGNOSIS — M1712 Unilateral primary osteoarthritis, left knee: Secondary | ICD-10-CM | POA: Diagnosis not present

## 2021-07-22 DIAGNOSIS — M160 Bilateral primary osteoarthritis of hip: Secondary | ICD-10-CM | POA: Diagnosis not present

## 2021-07-22 DIAGNOSIS — M9689 Other intraoperative and postprocedural complications and disorders of the musculoskeletal system: Secondary | ICD-10-CM | POA: Diagnosis not present

## 2021-07-22 DIAGNOSIS — M6150 Other ossification of muscle, unspecified site: Secondary | ICD-10-CM | POA: Diagnosis not present

## 2021-07-30 ENCOUNTER — Other Ambulatory Visit: Payer: Self-pay | Admitting: Family Medicine

## 2021-07-30 DIAGNOSIS — Z1231 Encounter for screening mammogram for malignant neoplasm of breast: Secondary | ICD-10-CM

## 2021-08-04 IMAGING — MR MR FOOT*L* W/O CM
5 series · 40 of 40 positions shown · non-contrast
Comparison: May 03, 2019

CLINICAL DATA: Left lateral foot pain since December 2018 negative
radiograph question of tendon injury

EXAM:
MRI OF THE LEFT FOOT WITHOUT CONTRAST
TECHNIQUE: Multiplanar, multisequence MR imaging of the left was performed. No
intravenous contrast was administered.

[Series 4: PD fat-sat · axial · left · 3.0mm · 0.50mm/px · z∈[-99,+40]mm · 10 of 36 slices shown]
[im 1/36]
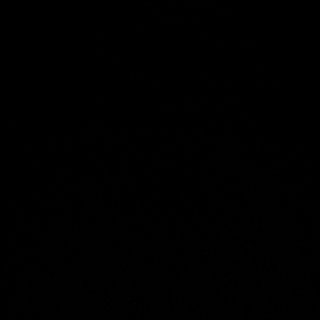
[im 4/36]
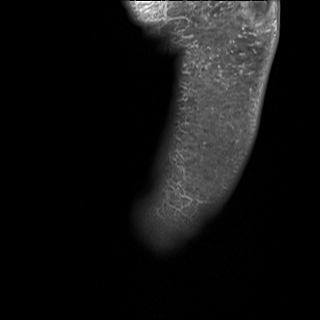
[im 8/36]
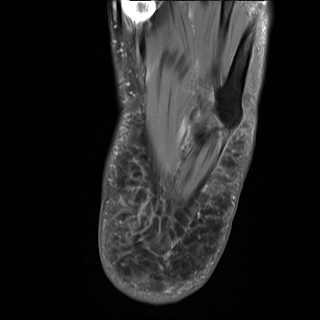
[im 12/36]
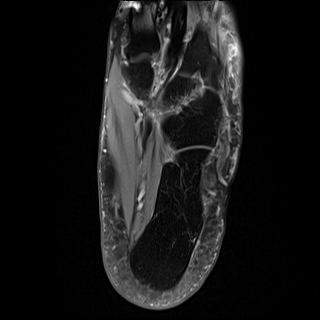
[im 16/36]
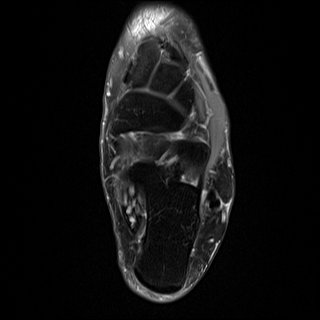
[im 20/36]
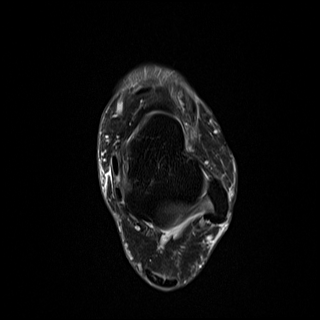
[im 24/36]
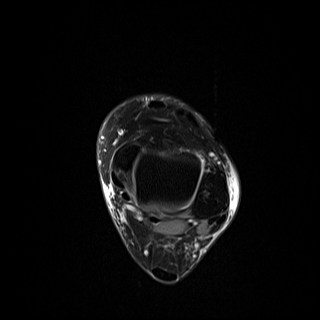
[im 28/36]
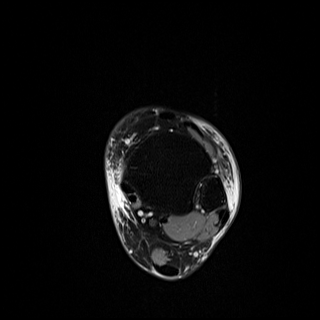
[im 32/36]
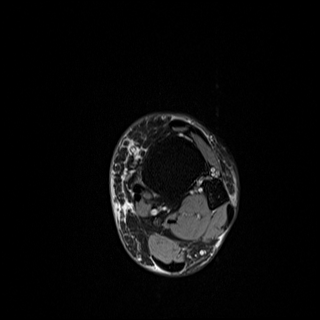
[im 36/36]
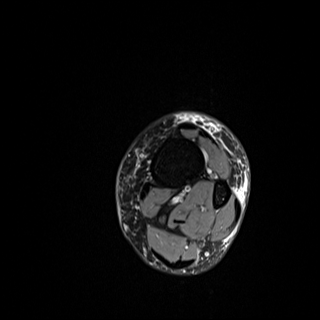

[Series 5: T2 fat-sat · axial · left · 3.0mm · 0.50mm/px · z∈[-99,+40]mm · 10 of 36 slices shown (1 of 2)]
[im 1/36]
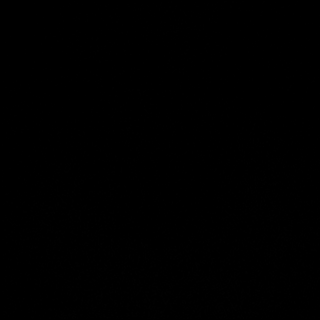
[im 4/36]
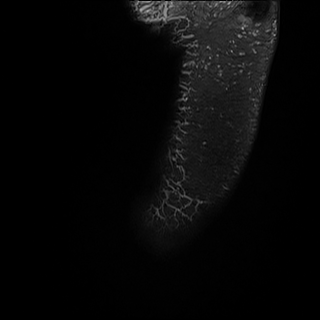
[im 8/36]
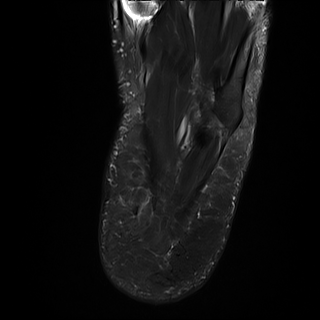
[im 12/36]
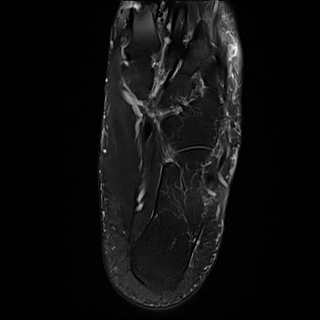
[im 16/36]
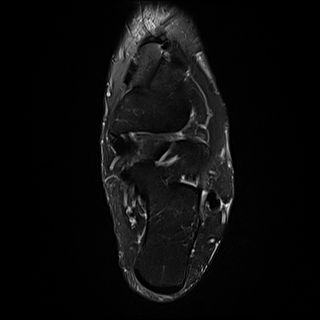
[im 20/36]
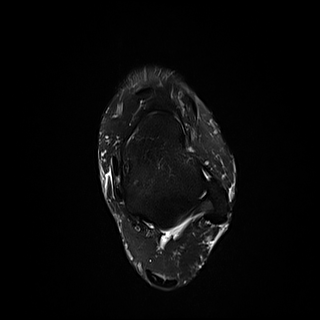
[im 24/36]
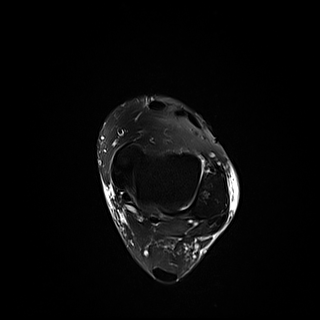
[im 28/36]
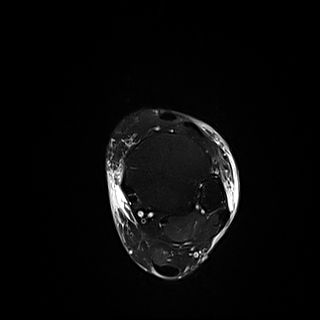
[im 32/36]
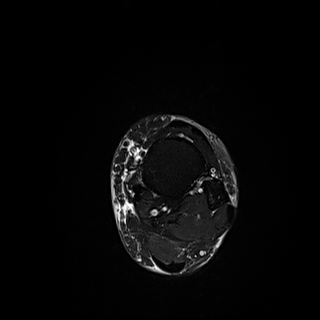
[im 36/36]
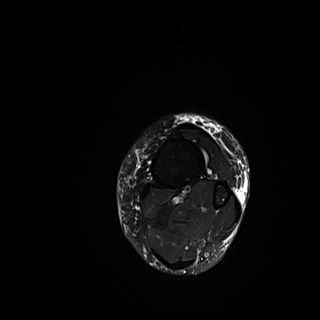

[Series 6: T2 fat-sat · coronal · left · 3.0mm · 0.62mm/px · 10 of 40 slices shown (2 of 2)]
[im 1/40]
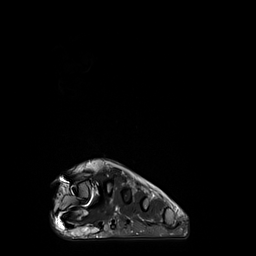
[im 5/40]
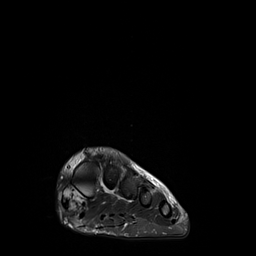
[im 9/40]
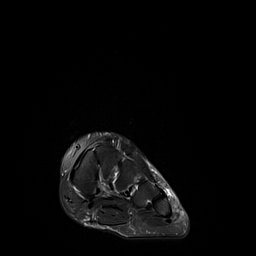
[im 14/40]
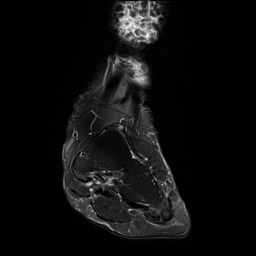
[im 18/40]
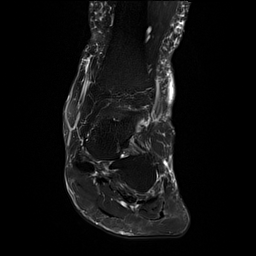
[im 22/40]
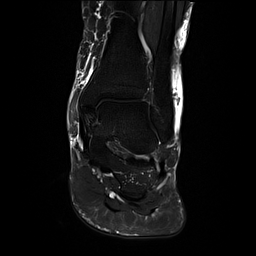
[im 27/40]
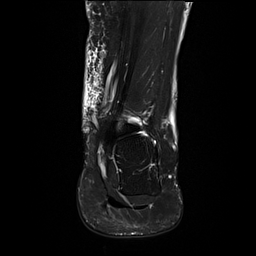
[im 31/40]
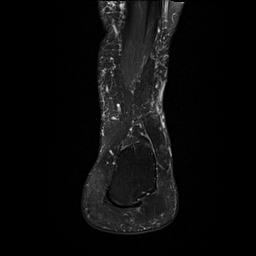
[im 35/40]
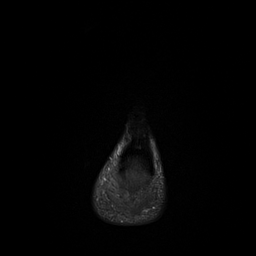
[im 40/40]
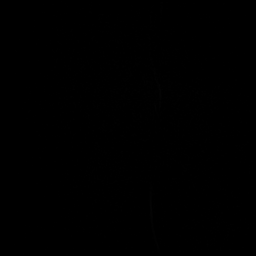

[Series 7: T1 · sagittal · left · 4.0mm · 0.70mm/px · 5 of 21 slices shown]
[im 1/21]
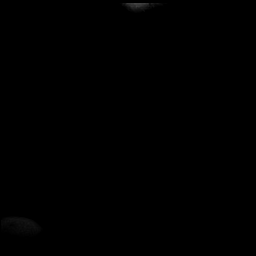
[im 6/21]
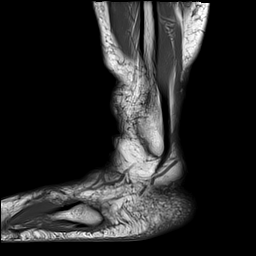
[im 11/21]
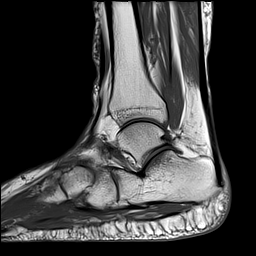
[im 16/21]
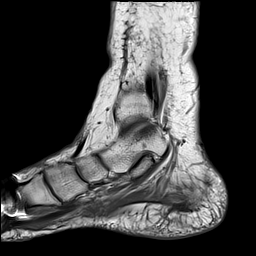
[im 21/21]
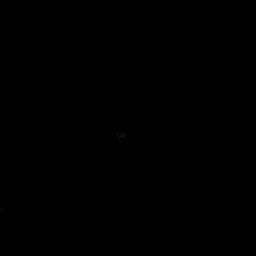

[Series 8: STIR · sagittal · left · 4.0mm · 0.35mm/px · 5 of 21 slices shown]
[im 1/21]
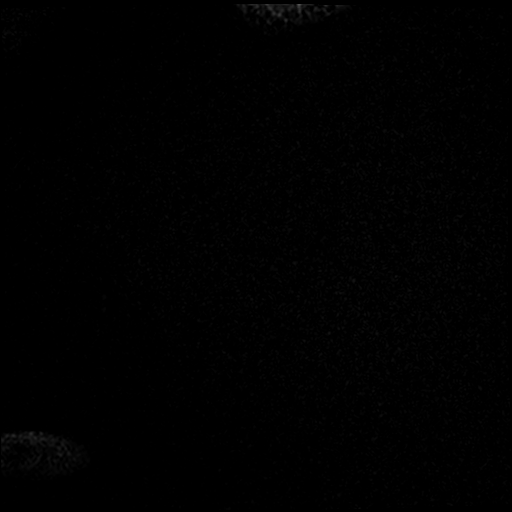
[im 6/21]
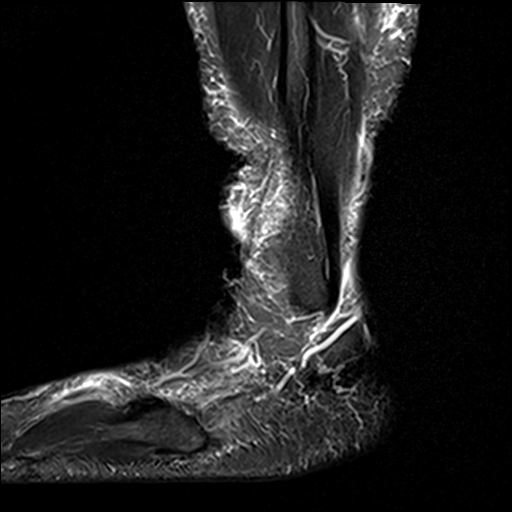
[im 11/21]
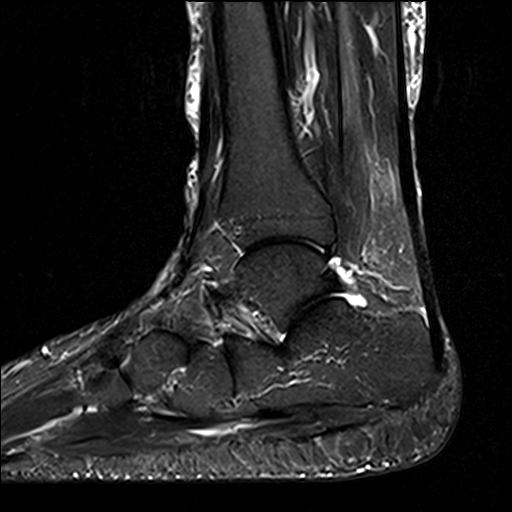
[im 16/21]
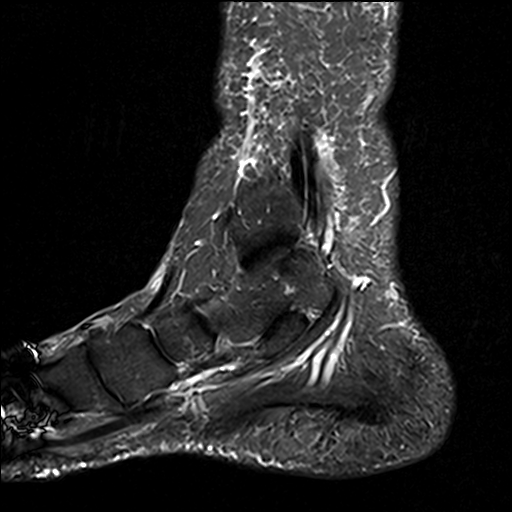
[im 21/21]
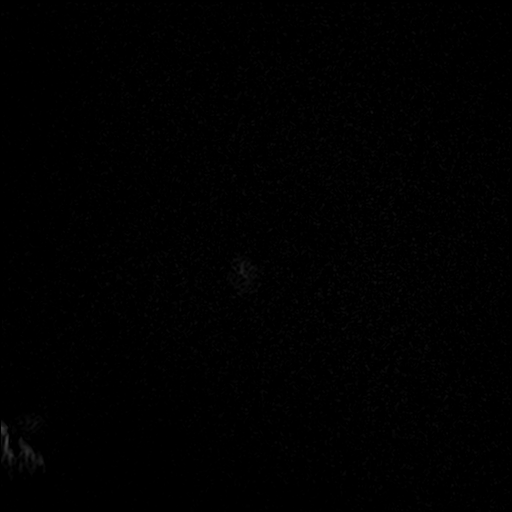

[40 of 40 positions shown; findings below may reference images not displayed]

FINDINGS: TENDONS

Peroneal: Peroneal longus tendon intact. Peroneal brevis intact.

Posteromedial: There is a small amount of fluid seen around the
posterior tibialis tendon. Flexor hallucis longus tendon intact.
Flexor digitorum longus tendon intact.

Anterior: Tibialis anterior tendon intact. Extensor hallucis longus
tendon intact Extensor digitorum longus tendon intact.

Achilles:  Intact.

Plantar Fascia: Intact.

LIGAMENTS

Lateral: Increased intrasubstance signal seen the anterior tibia
fibular ligament, however it is intact. Calcaneofibular ligament
intact. Posterior talofibular ligament intact. Anterior and
posterior tibiofibular ligaments intact.

Medial: Deltoid ligament intact. Spring ligament intact.

CARTILAGE

Ankle Joint: Mild joint space loss seen at the tibia-fibular
articulation with small subchondral cystic changes. Normal ankle
mortise. No chondral defect.

Subtalar Joints/Sinus Tarsi: Normal subtalar joints. No subtalar
joint effusion. Normal sinus tarsi.

Bones: No marrow signal abnormality. No fracture, marrow edema, or
pathologic marrow infiltration.

Soft Tissue: No focal soft tissue swelling or soft tissue mass.
IMPRESSION: Mild posterior tibialis tenosynovitis.

Intrasubstance sprain of the anterior tibiofibular ligament, however
it is intact.

Degenerative changes seen at the tibia-fibular articulation with
subchondral cystic changes.

## 2021-08-06 DIAGNOSIS — R2689 Other abnormalities of gait and mobility: Secondary | ICD-10-CM | POA: Diagnosis not present

## 2021-08-06 DIAGNOSIS — M25562 Pain in left knee: Secondary | ICD-10-CM | POA: Diagnosis not present

## 2021-08-06 DIAGNOSIS — M25551 Pain in right hip: Secondary | ICD-10-CM | POA: Diagnosis not present

## 2021-08-06 DIAGNOSIS — M25661 Stiffness of right knee, not elsewhere classified: Secondary | ICD-10-CM | POA: Diagnosis not present

## 2021-08-06 DIAGNOSIS — M25662 Stiffness of left knee, not elsewhere classified: Secondary | ICD-10-CM | POA: Diagnosis not present

## 2021-08-06 DIAGNOSIS — M25552 Pain in left hip: Secondary | ICD-10-CM | POA: Diagnosis not present

## 2021-08-08 DIAGNOSIS — M25552 Pain in left hip: Secondary | ICD-10-CM | POA: Diagnosis not present

## 2021-08-08 DIAGNOSIS — R2689 Other abnormalities of gait and mobility: Secondary | ICD-10-CM | POA: Diagnosis not present

## 2021-08-08 DIAGNOSIS — M25551 Pain in right hip: Secondary | ICD-10-CM | POA: Diagnosis not present

## 2021-08-08 DIAGNOSIS — M25662 Stiffness of left knee, not elsewhere classified: Secondary | ICD-10-CM | POA: Diagnosis not present

## 2021-08-08 DIAGNOSIS — M25562 Pain in left knee: Secondary | ICD-10-CM | POA: Diagnosis not present

## 2021-08-08 DIAGNOSIS — M25661 Stiffness of right knee, not elsewhere classified: Secondary | ICD-10-CM | POA: Diagnosis not present

## 2021-08-16 DIAGNOSIS — M25562 Pain in left knee: Secondary | ICD-10-CM | POA: Diagnosis not present

## 2021-08-16 DIAGNOSIS — M25662 Stiffness of left knee, not elsewhere classified: Secondary | ICD-10-CM | POA: Diagnosis not present

## 2021-08-16 DIAGNOSIS — R2689 Other abnormalities of gait and mobility: Secondary | ICD-10-CM | POA: Diagnosis not present

## 2021-08-16 DIAGNOSIS — M25551 Pain in right hip: Secondary | ICD-10-CM | POA: Diagnosis not present

## 2021-08-16 DIAGNOSIS — M25661 Stiffness of right knee, not elsewhere classified: Secondary | ICD-10-CM | POA: Diagnosis not present

## 2021-08-16 DIAGNOSIS — M25552 Pain in left hip: Secondary | ICD-10-CM | POA: Diagnosis not present

## 2021-08-19 DIAGNOSIS — M25551 Pain in right hip: Secondary | ICD-10-CM | POA: Diagnosis not present

## 2021-08-19 DIAGNOSIS — M25662 Stiffness of left knee, not elsewhere classified: Secondary | ICD-10-CM | POA: Diagnosis not present

## 2021-08-19 DIAGNOSIS — M25661 Stiffness of right knee, not elsewhere classified: Secondary | ICD-10-CM | POA: Diagnosis not present

## 2021-08-19 DIAGNOSIS — M25552 Pain in left hip: Secondary | ICD-10-CM | POA: Diagnosis not present

## 2021-08-19 DIAGNOSIS — R2689 Other abnormalities of gait and mobility: Secondary | ICD-10-CM | POA: Diagnosis not present

## 2021-08-19 DIAGNOSIS — M25562 Pain in left knee: Secondary | ICD-10-CM | POA: Diagnosis not present

## 2021-08-21 DIAGNOSIS — M25551 Pain in right hip: Secondary | ICD-10-CM | POA: Diagnosis not present

## 2021-08-21 DIAGNOSIS — M25662 Stiffness of left knee, not elsewhere classified: Secondary | ICD-10-CM | POA: Diagnosis not present

## 2021-08-21 DIAGNOSIS — M25562 Pain in left knee: Secondary | ICD-10-CM | POA: Diagnosis not present

## 2021-08-21 DIAGNOSIS — M25552 Pain in left hip: Secondary | ICD-10-CM | POA: Diagnosis not present

## 2021-08-21 DIAGNOSIS — R2689 Other abnormalities of gait and mobility: Secondary | ICD-10-CM | POA: Diagnosis not present

## 2021-08-21 DIAGNOSIS — M25661 Stiffness of right knee, not elsewhere classified: Secondary | ICD-10-CM | POA: Diagnosis not present

## 2021-08-26 DIAGNOSIS — M25562 Pain in left knee: Secondary | ICD-10-CM | POA: Diagnosis not present

## 2021-08-26 DIAGNOSIS — M25551 Pain in right hip: Secondary | ICD-10-CM | POA: Diagnosis not present

## 2021-08-26 DIAGNOSIS — M25552 Pain in left hip: Secondary | ICD-10-CM | POA: Diagnosis not present

## 2021-08-26 DIAGNOSIS — M25661 Stiffness of right knee, not elsewhere classified: Secondary | ICD-10-CM | POA: Diagnosis not present

## 2021-08-26 DIAGNOSIS — M25662 Stiffness of left knee, not elsewhere classified: Secondary | ICD-10-CM | POA: Diagnosis not present

## 2021-08-26 DIAGNOSIS — R2689 Other abnormalities of gait and mobility: Secondary | ICD-10-CM | POA: Diagnosis not present

## 2021-08-28 DIAGNOSIS — R2689 Other abnormalities of gait and mobility: Secondary | ICD-10-CM | POA: Diagnosis not present

## 2021-08-28 DIAGNOSIS — M25562 Pain in left knee: Secondary | ICD-10-CM | POA: Diagnosis not present

## 2021-08-28 DIAGNOSIS — M25661 Stiffness of right knee, not elsewhere classified: Secondary | ICD-10-CM | POA: Diagnosis not present

## 2021-08-28 DIAGNOSIS — M25551 Pain in right hip: Secondary | ICD-10-CM | POA: Diagnosis not present

## 2021-08-28 DIAGNOSIS — M25662 Stiffness of left knee, not elsewhere classified: Secondary | ICD-10-CM | POA: Diagnosis not present

## 2021-08-28 DIAGNOSIS — M25552 Pain in left hip: Secondary | ICD-10-CM | POA: Diagnosis not present

## 2021-09-02 DIAGNOSIS — M25552 Pain in left hip: Secondary | ICD-10-CM | POA: Diagnosis not present

## 2021-09-02 DIAGNOSIS — M25662 Stiffness of left knee, not elsewhere classified: Secondary | ICD-10-CM | POA: Diagnosis not present

## 2021-09-02 DIAGNOSIS — R2689 Other abnormalities of gait and mobility: Secondary | ICD-10-CM | POA: Diagnosis not present

## 2021-09-02 DIAGNOSIS — M25562 Pain in left knee: Secondary | ICD-10-CM | POA: Diagnosis not present

## 2021-09-02 DIAGNOSIS — M25661 Stiffness of right knee, not elsewhere classified: Secondary | ICD-10-CM | POA: Diagnosis not present

## 2021-09-02 DIAGNOSIS — M25551 Pain in right hip: Secondary | ICD-10-CM | POA: Diagnosis not present

## 2021-09-09 DIAGNOSIS — M25562 Pain in left knee: Secondary | ICD-10-CM | POA: Diagnosis not present

## 2021-09-09 DIAGNOSIS — M25661 Stiffness of right knee, not elsewhere classified: Secondary | ICD-10-CM | POA: Diagnosis not present

## 2021-09-09 DIAGNOSIS — M25551 Pain in right hip: Secondary | ICD-10-CM | POA: Diagnosis not present

## 2021-09-09 DIAGNOSIS — M25552 Pain in left hip: Secondary | ICD-10-CM | POA: Diagnosis not present

## 2021-09-09 DIAGNOSIS — R2689 Other abnormalities of gait and mobility: Secondary | ICD-10-CM | POA: Diagnosis not present

## 2021-09-09 DIAGNOSIS — M25662 Stiffness of left knee, not elsewhere classified: Secondary | ICD-10-CM | POA: Diagnosis not present

## 2021-09-11 DIAGNOSIS — R2689 Other abnormalities of gait and mobility: Secondary | ICD-10-CM | POA: Diagnosis not present

## 2021-09-11 DIAGNOSIS — M25562 Pain in left knee: Secondary | ICD-10-CM | POA: Diagnosis not present

## 2021-09-11 DIAGNOSIS — M25551 Pain in right hip: Secondary | ICD-10-CM | POA: Diagnosis not present

## 2021-09-11 DIAGNOSIS — M25661 Stiffness of right knee, not elsewhere classified: Secondary | ICD-10-CM | POA: Diagnosis not present

## 2021-09-11 DIAGNOSIS — M25552 Pain in left hip: Secondary | ICD-10-CM | POA: Diagnosis not present

## 2021-09-11 DIAGNOSIS — M25662 Stiffness of left knee, not elsewhere classified: Secondary | ICD-10-CM | POA: Diagnosis not present

## 2021-09-16 ENCOUNTER — Other Ambulatory Visit: Payer: Self-pay | Admitting: Family Medicine

## 2021-09-16 ENCOUNTER — Ambulatory Visit
Admission: RE | Admit: 2021-09-16 | Discharge: 2021-09-16 | Disposition: A | Discharge status: Home / Self Care | Payer: Medicare Other | Source: Ambulatory Visit | Attending: Family Medicine | Admitting: Family Medicine

## 2021-09-16 DIAGNOSIS — M25662 Stiffness of left knee, not elsewhere classified: Secondary | ICD-10-CM | POA: Diagnosis not present

## 2021-09-16 DIAGNOSIS — Z1231 Encounter for screening mammogram for malignant neoplasm of breast: Secondary | ICD-10-CM | POA: Insufficient documentation

## 2021-09-16 DIAGNOSIS — R2689 Other abnormalities of gait and mobility: Secondary | ICD-10-CM | POA: Diagnosis not present

## 2021-09-16 DIAGNOSIS — M25551 Pain in right hip: Secondary | ICD-10-CM | POA: Diagnosis not present

## 2021-09-16 DIAGNOSIS — M25562 Pain in left knee: Secondary | ICD-10-CM | POA: Diagnosis not present

## 2021-09-16 DIAGNOSIS — M25552 Pain in left hip: Secondary | ICD-10-CM | POA: Diagnosis not present

## 2021-09-16 DIAGNOSIS — E785 Hyperlipidemia, unspecified: Secondary | ICD-10-CM

## 2021-09-16 DIAGNOSIS — M25661 Stiffness of right knee, not elsewhere classified: Secondary | ICD-10-CM | POA: Diagnosis not present

## 2021-09-17 ENCOUNTER — Other Ambulatory Visit: Payer: Self-pay | Admitting: Family Medicine

## 2021-09-17 DIAGNOSIS — I10 Essential (primary) hypertension: Secondary | ICD-10-CM

## 2021-09-18 DIAGNOSIS — M25662 Stiffness of left knee, not elsewhere classified: Secondary | ICD-10-CM | POA: Diagnosis not present

## 2021-09-18 DIAGNOSIS — M25661 Stiffness of right knee, not elsewhere classified: Secondary | ICD-10-CM | POA: Diagnosis not present

## 2021-09-18 DIAGNOSIS — M25551 Pain in right hip: Secondary | ICD-10-CM | POA: Diagnosis not present

## 2021-09-18 DIAGNOSIS — R2689 Other abnormalities of gait and mobility: Secondary | ICD-10-CM | POA: Diagnosis not present

## 2021-09-18 DIAGNOSIS — M25552 Pain in left hip: Secondary | ICD-10-CM | POA: Diagnosis not present

## 2021-09-18 DIAGNOSIS — M25562 Pain in left knee: Secondary | ICD-10-CM | POA: Diagnosis not present

## 2021-09-23 DIAGNOSIS — M25562 Pain in left knee: Secondary | ICD-10-CM | POA: Diagnosis not present

## 2021-09-23 DIAGNOSIS — M25662 Stiffness of left knee, not elsewhere classified: Secondary | ICD-10-CM | POA: Diagnosis not present

## 2021-09-23 DIAGNOSIS — R2689 Other abnormalities of gait and mobility: Secondary | ICD-10-CM | POA: Diagnosis not present

## 2021-09-23 DIAGNOSIS — M25551 Pain in right hip: Secondary | ICD-10-CM | POA: Diagnosis not present

## 2021-09-23 DIAGNOSIS — M25661 Stiffness of right knee, not elsewhere classified: Secondary | ICD-10-CM | POA: Diagnosis not present

## 2021-09-23 DIAGNOSIS — M25552 Pain in left hip: Secondary | ICD-10-CM | POA: Diagnosis not present

## 2021-09-25 DIAGNOSIS — R2689 Other abnormalities of gait and mobility: Secondary | ICD-10-CM | POA: Diagnosis not present

## 2021-09-25 DIAGNOSIS — M25562 Pain in left knee: Secondary | ICD-10-CM | POA: Diagnosis not present

## 2021-09-25 DIAGNOSIS — M25662 Stiffness of left knee, not elsewhere classified: Secondary | ICD-10-CM | POA: Diagnosis not present

## 2021-09-25 DIAGNOSIS — M25551 Pain in right hip: Secondary | ICD-10-CM | POA: Diagnosis not present

## 2021-09-25 DIAGNOSIS — M25661 Stiffness of right knee, not elsewhere classified: Secondary | ICD-10-CM | POA: Diagnosis not present

## 2021-09-25 DIAGNOSIS — M25552 Pain in left hip: Secondary | ICD-10-CM | POA: Diagnosis not present

## 2021-09-30 DIAGNOSIS — M25551 Pain in right hip: Secondary | ICD-10-CM | POA: Diagnosis not present

## 2021-09-30 DIAGNOSIS — M25662 Stiffness of left knee, not elsewhere classified: Secondary | ICD-10-CM | POA: Diagnosis not present

## 2021-09-30 DIAGNOSIS — M25661 Stiffness of right knee, not elsewhere classified: Secondary | ICD-10-CM | POA: Diagnosis not present

## 2021-09-30 DIAGNOSIS — M25552 Pain in left hip: Secondary | ICD-10-CM | POA: Diagnosis not present

## 2021-09-30 DIAGNOSIS — M25562 Pain in left knee: Secondary | ICD-10-CM | POA: Diagnosis not present

## 2021-09-30 DIAGNOSIS — R2689 Other abnormalities of gait and mobility: Secondary | ICD-10-CM | POA: Diagnosis not present

## 2021-10-02 DIAGNOSIS — M25562 Pain in left knee: Secondary | ICD-10-CM | POA: Diagnosis not present

## 2021-10-02 DIAGNOSIS — R2689 Other abnormalities of gait and mobility: Secondary | ICD-10-CM | POA: Diagnosis not present

## 2021-10-02 DIAGNOSIS — I498 Other specified cardiac arrhythmias: Secondary | ICD-10-CM | POA: Diagnosis not present

## 2021-10-02 DIAGNOSIS — I10 Essential (primary) hypertension: Secondary | ICD-10-CM | POA: Diagnosis not present

## 2021-10-02 DIAGNOSIS — M25551 Pain in right hip: Secondary | ICD-10-CM | POA: Diagnosis not present

## 2021-10-02 DIAGNOSIS — M25552 Pain in left hip: Secondary | ICD-10-CM | POA: Diagnosis not present

## 2021-10-02 DIAGNOSIS — M25662 Stiffness of left knee, not elsewhere classified: Secondary | ICD-10-CM | POA: Diagnosis not present

## 2021-10-02 DIAGNOSIS — E669 Obesity, unspecified: Secondary | ICD-10-CM | POA: Diagnosis not present

## 2021-10-02 DIAGNOSIS — I208 Other forms of angina pectoris: Secondary | ICD-10-CM | POA: Diagnosis not present

## 2021-10-02 DIAGNOSIS — M25661 Stiffness of right knee, not elsewhere classified: Secondary | ICD-10-CM | POA: Diagnosis not present

## 2021-10-02 DIAGNOSIS — G4733 Obstructive sleep apnea (adult) (pediatric): Secondary | ICD-10-CM | POA: Diagnosis not present

## 2021-10-03 ENCOUNTER — Ambulatory Visit: Payer: Medicare Other | Admitting: Family Medicine

## 2021-10-09 DIAGNOSIS — R2689 Other abnormalities of gait and mobility: Secondary | ICD-10-CM | POA: Diagnosis not present

## 2021-10-09 DIAGNOSIS — M25662 Stiffness of left knee, not elsewhere classified: Secondary | ICD-10-CM | POA: Diagnosis not present

## 2021-10-09 DIAGNOSIS — M25661 Stiffness of right knee, not elsewhere classified: Secondary | ICD-10-CM | POA: Diagnosis not present

## 2021-10-09 DIAGNOSIS — M25551 Pain in right hip: Secondary | ICD-10-CM | POA: Diagnosis not present

## 2021-10-09 DIAGNOSIS — M25562 Pain in left knee: Secondary | ICD-10-CM | POA: Diagnosis not present

## 2021-10-09 DIAGNOSIS — M25552 Pain in left hip: Secondary | ICD-10-CM | POA: Diagnosis not present

## 2021-10-09 NOTE — Progress Notes (Unsigned)
Name: Lisa Steele   MRN: [id number]    DOB: 1945-08-24   Date:10/09/2021       Progress Note  Subjective  Chief Complaint  Follow Up  HPI  HTN: taking bp medication daily. She denies SOB , palpitation or  dizziness. No side effects of medication . BP is at goal , continue Benicar hctz   Chest pain: she was seen by Dr. Juliann Pares and had negative stress test, she recently went back for follow up, but doing well and no longer has chest pain    Hyperlipidemia: taking Crestor, no myalgias, compliant with medications. Last LDL had gone up but she would like to hold off until next visit to recheck labs   OSA: she sees Dr. Meredeth Ide, she is back on full mask and tolerating it well. She has been feeling better when she wakes up. Nocturia has improved since she started to wear CPAP machine   Morbid obesity: BMI above 35 with multiple co-morbidities, such as dyslipidemia , HTN and OA   History of right hip replacement : 06/2019, on follow up noticed to have heterotopic ossification, she went back for a revision 08/30/20 and had radiation to prevent recurrence done by [facility] July 1 st 2022 She had PT, she has been using a cane when she is out of the house ,she is going to stretch zone in [facility] and has really helped with her stiffness. She feels more comfortable now with her ability to walk  White spot on right nipple: she noticed it three days ago, not draining , just a white spot , no pain or discomfort, very worried about cancer, last mammogram normal 07/22   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   Follow up 10/25/2019   Primary localized osteoarthritis 06/18/2019   Status post total hip replacement, right 06/16/2019   Trigger finger of left thumb 03/04/2018   Left hand pain 02/03/2018   S/P rotator cuff repair 01/12/2018   Injury of left rotator cuff 11/03/2017    Obesity (BMI 30.0-34.9) 02/19/2017   Primary osteoarthritis of both knees 06/22/2014   Obesity (BMI 30-39.9) 06/22/2014   Dysmetabolic syndrome 06/22/2014   Post-menopausal 06/22/2014   H/O cold sores 06/22/2014   Dyslipidemia 06/22/2014   Elevated fasting blood sugar 06/22/2014   Benign essential HTN 06/22/2014   Abnormal ECG 06/22/2014    Past Surgical History:  Procedure Laterality Date   APPENDECTOMY     BREAST EXCISIONAL BIOPSY Left 1970   neg   BREAST SURGERY Left    cyst removed   BUNIONECTOMY Left 2008   CATARACT EXTRACTION, BILATERAL  10/2013   COLONOSCOPY     2005, 2009, 2013   COLONOSCOPY WITH PROPOFOL N/A 06/25/2016   Procedure: COLONOSCOPY WITH PROPOFOL;  Surgeon: Scot Jun, MD;  Location: [facility] ENDOSCOPY;  Service: Endoscopy;  Laterality: N/A;   COLONOSCOPY WITH PROPOFOL N/A 04/11/2021   Procedure: COLONOSCOPY WITH PROPOFOL;  Surgeon: Wyline Mood, MD;  Location: [facility] ENDOSCOPY;  Service: Gastroenterology;  Laterality: N/A;   ECTOPIC PREGNANCY SURGERY  1978   EYE SURGERY     JOINT REPLACEMENT     SHOULDER ARTHROSCOPY WITH ROTATOR CUFF REPAIR AND SUBACROMIAL DECOMPRESSION Left 11/25/2017   Procedure: SHOULDER ARTHROSCOPic release of long head biceps tendon and mini open rotator cuff repair;  Surgeon: Erin Sons, MD;  Location: [facility] ORS;  Service: Orthopedics;  Laterality: Left;  TONSILECTOMY, ADENOIDECTOMY, BILATERAL MYRINGOTOMY AND TUBES     TONSILLECTOMY     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   Procedure: TOTAL HIP ARTHROPLASTY ANTERIOR APPROACH;  Surgeon: Kennedy Bucker, MD;  Location: [facility] ORS;  Service: Orthopedics;  Laterality: Right;   TOTAL HIP ARTHROPLASTY Right 08/30/2020   Procedure: Right hip heterotopic ossification removal;  Surgeon: Kennedy Bucker, MD;  Location: [facility] ORS;  Service: Orthopedics;  Laterality: Right;   TUBAL LIGATION      Family History  Problem Relation Age of Onset   Diabetes Mother    Hypertension Mother    Heart  disease Mother    Hyperlipidemia Mother    Heart disease Father    Diabetes Sister    Heart disease Sister    Hypertension Sister    Hyperlipidemia Sister    Breast cancer Sister 1   Cancer Sister 21       breast   Breast cancer Maternal Aunt    Diabetes Sister    Stroke Sister    Diabetes Sister    Diabetes Sister     Social History   Tobacco Use   Smoking status: Former    Years: 10.00    Types: Cigarettes    Start date: 03/03/1958    Quit date: 1980    Years since quitting: 43.6   Smokeless tobacco: Never  Substance Use Topics   Alcohol use: Yes    Alcohol/week: 0.0 standard drinks of alcohol    Comment: socially     Current Outpatient Medications:    aspirin EC 81 MG tablet, Take 1 tablet (81 mg total) by mouth every evening. Swallow whole., Disp: 90 tablet, Rfl: 3   CLENPIQ 10-3.5-12 MG-GM -GM/160ML SOLN, Take by mouth once. (Patient not taking: Reported on 04/11/2021), Disp: , Rfl:    olmesartan-hydrochlorothiazide (BENICAR HCT) 40-25 MG tablet, TAKE 1 TABLET BY MOUTH DAILY, Disp: 90 tablet, Rfl: 0   Polyethyl Glycol-Propyl Glycol (SYSTANE) 0.4-0.3 % SOLN, Place 1 drop into both eyes at bedtime., Disp: , Rfl:    rosuvastatin (CRESTOR) 10 MG tablet, TAKE 1 TABLET BY MOUTH DAILY, Disp: 90 tablet, Rfl: 3  Allergies  Allergen Reactions   Tramadol Nausea And Vomiting   Doxycycline Itching   Penicillins Hives and Rash    leg lesions and weakness (couldn't walk)  Did it involve swelling of the face/tongue/throat, SOB, or low BP? No Did it involve sudden or severe rash/hives, skin peeling, or any reaction on the inside of your mouth or nose? No Did you need to seek medical attention at a hospital or doctor's office? Yes When did it last happen?      38 years If all above answers are "NO", may proceed with cephalosporin use.    I personally reviewed active problem list, medication list, allergies, family history, social history, health maintenance with the  patient/caregiver today.   ROS  ***  Objective  There were no vitals filed for this visit.  There is no height or weight on file to calculate BMI.  Physical Exam ***  No results found for this or any previous visit (from the past 2160 hour(s)).   PHQ2/9:    04/05/2021   10:46 AM 03/21/2021    9:23 AM 10/29/2020   11:40 AM 04/27/2020   11:01 AM 03/20/2020    9:24 AM  Depression screen PHQ 2/9  Decreased Interest 0 0 0 0 0  Down, Depressed, Hopeless 0 0 0 0 0  PHQ - 2 Score 0 0 0  0 0  Altered sleeping 0      Tired, decreased energy 0      Change in appetite 0      Feeling bad or failure about yourself  0      Trouble concentrating 0      Moving slowly or fidgety/restless 0      Suicidal thoughts 0      PHQ-9 Score 0      Difficult doing work/chores Not difficult at all        phq 9 is {gen pos [id number]}   Fall Risk:    04/05/2021   10:46 AM 03/21/2021    9:28 AM 10/29/2020   11:39 AM 04/27/2020   11:01 AM 03/20/2020    9:26 AM  Fall Risk   Falls in the past year? 0 0 0 0 0  Number falls in past yr: 0 0 0 0 0  Injury with Fall? 0 0 0 0 0  Risk for fall due to : No Fall Risks No Fall Risks No Fall Risks  No Fall Risks  Follow up Falls prevention discussed Falls prevention discussed Falls prevention discussed  Falls prevention discussed      Functional Status Survey:      Assessment & Plan  *** There are no diagnoses linked to this encounter.

## 2021-10-10 ENCOUNTER — Encounter: Payer: Self-pay | Admitting: Family Medicine

## 2021-10-10 ENCOUNTER — Ambulatory Visit (INDEPENDENT_AMBULATORY_CARE_PROVIDER_SITE_OTHER): Payer: Medicare Other | Admitting: Family Medicine

## 2021-10-10 DIAGNOSIS — G4733 Obstructive sleep apnea (adult) (pediatric): Secondary | ICD-10-CM

## 2021-10-10 DIAGNOSIS — E785 Hyperlipidemia, unspecified: Secondary | ICD-10-CM | POA: Diagnosis not present

## 2021-10-10 DIAGNOSIS — M25562 Pain in left knee: Secondary | ICD-10-CM | POA: Diagnosis not present

## 2021-10-10 DIAGNOSIS — Z79899 Other long term (current) drug therapy: Secondary | ICD-10-CM | POA: Diagnosis not present

## 2021-10-10 DIAGNOSIS — M170 Bilateral primary osteoarthritis of knee: Secondary | ICD-10-CM | POA: Diagnosis not present

## 2021-10-10 DIAGNOSIS — Z862 Personal history of diseases of the blood and blood-forming organs and certain disorders involving the immune mechanism: Secondary | ICD-10-CM | POA: Diagnosis not present

## 2021-10-10 DIAGNOSIS — G8929 Other chronic pain: Secondary | ICD-10-CM | POA: Diagnosis not present

## 2021-10-10 DIAGNOSIS — I10 Essential (primary) hypertension: Secondary | ICD-10-CM

## 2021-10-10 DIAGNOSIS — R202 Paresthesia of skin: Secondary | ICD-10-CM | POA: Diagnosis not present

## 2021-10-10 DIAGNOSIS — Z9989 Dependence on other enabling machines and devices: Secondary | ICD-10-CM | POA: Diagnosis not present

## 2021-10-10 MED ORDER — PREGABALIN 50 MG PO CAPS: 50.0000 mg | ORAL_CAPSULE | Freq: Three times a day (TID) | ORAL | 0 refills | Status: DC

## 2021-10-10 MED ORDER — OLMESARTAN MEDOXOMIL-HCTZ 40-25 MG PO TABS: 1.0000 | ORAL_TABLET | Freq: Every day | ORAL | 1 refills | Status: DC

## 2021-10-10 NOTE — Patient Instructions (Signed)
RSV and Flu in the fall

## 2021-10-11 DIAGNOSIS — M25551 Pain in right hip: Secondary | ICD-10-CM | POA: Diagnosis not present

## 2021-10-11 DIAGNOSIS — M25662 Stiffness of left knee, not elsewhere classified: Secondary | ICD-10-CM | POA: Diagnosis not present

## 2021-10-11 DIAGNOSIS — M25562 Pain in left knee: Secondary | ICD-10-CM | POA: Diagnosis not present

## 2021-10-11 DIAGNOSIS — M25552 Pain in left hip: Secondary | ICD-10-CM | POA: Diagnosis not present

## 2021-10-11 DIAGNOSIS — R2689 Other abnormalities of gait and mobility: Secondary | ICD-10-CM | POA: Diagnosis not present

## 2021-10-11 DIAGNOSIS — M25661 Stiffness of right knee, not elsewhere classified: Secondary | ICD-10-CM | POA: Diagnosis not present

## 2021-10-11 LAB — CBC WITH DIFFERENTIAL/PLATELET
Absolute Monocytes: 580 cells/uL (ref 200–950)
Basophils Absolute: 60 cells/uL (ref 0–200)
Basophils Relative: 1.2 %
Eosinophils Absolute: 60 cells/uL (ref 15–500)
Eosinophils Relative: 1.2 %
HCT: 45 % (ref 35.0–45.0)
Hemoglobin: 14.5 g/dL (ref 11.7–15.5)
Lymphs Abs: 2280 cells/uL (ref 850–3900)
MCH: 28 pg (ref 27.0–33.0)
MCHC: 32.2 g/dL (ref 32.0–36.0)
MCV: 87 fL (ref 80.0–100.0)
MPV: 11.5 fL (ref 7.5–12.5)
Monocytes Relative: 11.6 %
Neutro Abs: 2020 cells/uL (ref 1500–7800)
Neutrophils Relative %: 40.4 %
Platelets: 366 10*3/uL (ref 140–400)
RBC: 5.17 10*6/uL — ABNORMAL HIGH (ref 3.80–5.10)
RDW: 14.5 % (ref 11.0–15.0)
Total Lymphocyte: 45.6 %
WBC: 5 10*3/uL (ref 3.8–10.8)

## 2021-10-11 LAB — COMPLETE METABOLIC PANEL WITH GFR
AG Ratio: 1.5 (calc) (ref 1.0–2.5)
ALT: 12 U/L (ref 6–29)
AST: 15 U/L (ref 10–35)
Albumin: 4.4 g/dL (ref 3.6–5.1)
Alkaline phosphatase (APISO): 67 U/L (ref 37–153)
BUN: 14 mg/dL (ref 7–25)
CO2: 31 mmol/L (ref 20–32)
Calcium: 10 mg/dL (ref 8.6–10.4)
Chloride: 101 mmol/L (ref 98–110)
Creat: 0.98 mg/dL (ref 0.60–1.00)
Globulin: 3 g/dL (calc) (ref 1.9–3.7)
Glucose, Bld: 130 mg/dL — ABNORMAL HIGH (ref 65–99)
Potassium: 4.9 mmol/L (ref 3.5–5.3)
Sodium: 141 mmol/L (ref 135–146)
Total Bilirubin: 0.6 mg/dL (ref 0.2–1.2)
Total Protein: 7.4 g/dL (ref 6.1–8.1)
eGFR: 60 mL/min/{1.73_m2} (ref >=60)

## 2021-10-11 LAB — LIPID PANEL
Cholesterol: 184 mg/dL (ref <200)
HDL: 63 mg/dL (ref >=50)
LDL Cholesterol (Calc): 101 mg/dL (calc) — ABNORMAL HIGH
Non-HDL Cholesterol (Calc): 121 mg/dL (calc) (ref <130)
Total CHOL/HDL Ratio: 2.9 (calc) (ref <5.0)
Triglycerides: 106 mg/dL (ref <150)

## 2021-10-13 ENCOUNTER — Encounter: Payer: Self-pay | Admitting: Family Medicine

## 2021-10-14 ENCOUNTER — Other Ambulatory Visit: Payer: Self-pay | Admitting: Family Medicine

## 2021-10-14 DIAGNOSIS — E785 Hyperlipidemia, unspecified: Secondary | ICD-10-CM

## 2021-10-14 DIAGNOSIS — M25661 Stiffness of right knee, not elsewhere classified: Secondary | ICD-10-CM | POA: Diagnosis not present

## 2021-10-14 DIAGNOSIS — R2689 Other abnormalities of gait and mobility: Secondary | ICD-10-CM | POA: Diagnosis not present

## 2021-10-14 DIAGNOSIS — M25551 Pain in right hip: Secondary | ICD-10-CM | POA: Diagnosis not present

## 2021-10-14 DIAGNOSIS — M25562 Pain in left knee: Secondary | ICD-10-CM | POA: Diagnosis not present

## 2021-10-14 DIAGNOSIS — M25662 Stiffness of left knee, not elsewhere classified: Secondary | ICD-10-CM | POA: Diagnosis not present

## 2021-10-14 DIAGNOSIS — M25552 Pain in left hip: Secondary | ICD-10-CM | POA: Diagnosis not present

## 2021-10-14 MED ORDER — ROSUVASTATIN CALCIUM 20 MG PO TABS: 20.0000 mg | ORAL_TABLET | Freq: Every day | ORAL | 3 refills | Status: DC

## 2021-10-15 DIAGNOSIS — E669 Obesity, unspecified: Secondary | ICD-10-CM | POA: Diagnosis not present

## 2021-10-15 DIAGNOSIS — G4733 Obstructive sleep apnea (adult) (pediatric): Secondary | ICD-10-CM | POA: Diagnosis not present

## 2021-10-16 DIAGNOSIS — M25662 Stiffness of left knee, not elsewhere classified: Secondary | ICD-10-CM | POA: Diagnosis not present

## 2021-10-16 DIAGNOSIS — R2689 Other abnormalities of gait and mobility: Secondary | ICD-10-CM | POA: Diagnosis not present

## 2021-10-16 DIAGNOSIS — M25551 Pain in right hip: Secondary | ICD-10-CM | POA: Diagnosis not present

## 2021-10-16 DIAGNOSIS — M25552 Pain in left hip: Secondary | ICD-10-CM | POA: Diagnosis not present

## 2021-10-16 DIAGNOSIS — M25661 Stiffness of right knee, not elsewhere classified: Secondary | ICD-10-CM | POA: Diagnosis not present

## 2021-10-16 DIAGNOSIS — M25562 Pain in left knee: Secondary | ICD-10-CM | POA: Diagnosis not present

## 2021-10-18 DIAGNOSIS — M170 Bilateral primary osteoarthritis of knee: Secondary | ICD-10-CM | POA: Diagnosis not present

## 2021-10-18 DIAGNOSIS — M25552 Pain in left hip: Secondary | ICD-10-CM | POA: Diagnosis not present

## 2021-10-18 DIAGNOSIS — M47816 Spondylosis without myelopathy or radiculopathy, lumbar region: Secondary | ICD-10-CM | POA: Diagnosis not present

## 2021-10-18 DIAGNOSIS — Z96641 Presence of right artificial hip joint: Secondary | ICD-10-CM | POA: Diagnosis not present

## 2021-11-06 ENCOUNTER — Ambulatory Visit: Payer: Self-pay | Admitting: *Deleted

## 2021-11-06 NOTE — Patient Instructions (Signed)
Visit Information  Thank you for taking time to visit with me today. Please don't hesitate to contact me if I can be of assistance to you.   Following are the goals we discussed today:   Goals Addressed             This Visit's Progress    care coordination activities       Care Coordination Interventions: Care Management program discussed and offered SDOH screening completed AWV confirmed-scheduled for 03/25/22 Next visit with PCP scheduled for 04/10/22 Patient interested in follow up with RNCM to help manage her health conditions RNCM phone visit scheduled for 11/13/21 at 11:30am         Our next appointment is by telephone on 11/13/21 at 11:30am  Please call the care guide team at [phone] if you need to cancel or reschedule your appointment.   If you are experiencing a Mental Health or Behavioral Health Crisis or need someone to talk to, please call 911   Patient verbalizes understanding of instructions and care plan provided today and agrees to view in MyChart. Active MyChart status and patient understanding of how to access instructions and care plan via MyChart confirmed with patient.     Telephone follow up appointment with care management team member scheduled for:11/13/21  Verna Czech, [place] Clinical Social Worker  [facility] Care Management [phone]

## 2021-11-06 NOTE — Patient Outreach (Signed)
  Care Coordination   Initial Visit Note   11/06/2021 Name: Tykesha Konicki MRN: [id number] DOB: September 25, 1945  Jeneane Pieczynski is a 76 y.o. year old female who sees [name], MD for primary care. I spoke with  [name] by phone today.  What matters to the patients health and wellness today?  Help with managing my health conditions    Goals Addressed             This Visit's Progress    care coordination activities       Care Coordination Interventions: Care Management program discussed and offered SDOH screening completed AWV confirmed-scheduled for 03/25/22 Next visit with PCP scheduled for 04/10/22 Patient interested in follow up with RNCM to help manage her health conditions RNCM phone visit scheduled for 11/13/21 at 11:30am         SDOH assessments and interventions completed:  Yes  SDOH Interventions Today    Flowsheet Row Most Recent Value  SDOH Interventions   Transportation Interventions Intervention Not Indicated  Utilities Interventions Intervention Not Indicated  Financial Strain Interventions Intervention Not Indicated  Stress Interventions Intervention Not Indicated        Care Coordination Interventions Activated:  Yes  Care Coordination Interventions:  Yes, provided   Follow up plan: Follow up call scheduled for 11/13/21    Encounter Outcome:  Pt. Scheduled

## 2021-11-13 ENCOUNTER — Ambulatory Visit: Payer: Self-pay

## 2021-11-13 NOTE — Patient Outreach (Signed)
  Care Coordination   Initial Visit Note   11/13/2021 Name: Lisa Steele MRN: [id number] DOB: 1945/10/07  Lisa Steele is a 76 y.o. year old female who sees [name], MD for primary care. I spoke with  [name] by phone today.  What matters to the patients health and wellness today?  Pain control for her left knee    Goals Addressed             This Visit's Progress    RNCM: Effective Management of Pain       Care Coordination Interventions: Patient rates her pain level today at a 2/3 when she is sitting or not moving.  The patient says when she is moving she is at a 9 or a 10. She is going to start gel injections next week and is hopeful they are going to help her.  Reviewed provider established plan for pain management. Patient is seeing the specialist. Plan is place. The patient is hopeful that she will get relief with the gel injections Discussed importance of adherence to all scheduled medical appointments. Review and education provided. Counseled on the importance of reporting any/all new or changed pain symptoms or management strategies to pain management provider Advised patient to report to care team affect of pain on daily activities Discussed use of relaxation techniques and/or diversional activities to assist with pain reduction (distraction, imagery, relaxation, massage, acupressure, TENS, heat, and cold application Reviewed with patient prescribed pharmacological and nonpharmacological pain relief strategies Advised patient to discuss unresolved pain, or changes in level or intensity of pain with provider Screening for signs and symptoms of depression related to chronic disease state  Assessed social determinant of health barriers The patient agrees to work with the [facility] and agrees to regular outreaches. Review of the care coordination program and how the program works. The patient knows how to get in touch with the [facility]  Active listening / Reflection  utilized  Emotional Support Provided         SDOH assessments and interventions completed:  Yes  SDOH Interventions Today    Flowsheet Row Most Recent Value  SDOH Interventions   Food Insecurity Interventions Intervention Not Indicated  Housing Interventions Intervention Not Indicated        Care Coordination Interventions Activated:  Yes  Care Coordination Interventions:  Yes, provided   Follow up plan: Follow up call scheduled for 01-08-2022 at 1030 am    Encounter Outcome:  Pt. Visit Completed   Alto Denver RN, MSN, CCM Community Care Coordinator [facility]  [facility] Mobile: [phone]

## 2021-11-13 NOTE — Patient Outreach (Signed)
  Care Coordination   11/13/2021 Name: Lisa Steele MRN: [id number] DOB: 12/13/1945   Care Coordination Outreach Attempts:  An unsuccessful telephone outreach was attempted for a scheduled appointment today.  Follow Up Plan:  Additional outreach attempts will be made to offer the patient care coordination information and services.   Encounter Outcome:  No Answer  Care Coordination Interventions Activated:  No   Care Coordination Interventions:  No, not indicated    Alto Denver RN, MSN, CCM Community Care Coordinator [facility]  [facility] Mobile: [phone]

## 2021-11-13 NOTE — Patient Instructions (Signed)
Visit Information  Thank you for taking time to visit with me today. Please don't hesitate to contact me if I can be of assistance to you.   Following are the goals we discussed today:   Goals Addressed             This Visit's Progress    RNCM: Effective Management of Pain       Care Coordination Interventions: Patient rates her pain level today at a 2/3 when she is sitting or not moving.  The patient says when she is moving she is at a 9 or a 10. She is going to start gel injections next week and is hopeful they are going to help her.  Reviewed provider established plan for pain management. Patient is seeing the specialist. Plan is place. The patient is hopeful that she will get relief with the gel injections Discussed importance of adherence to all scheduled medical appointments. Review and education provided. Counseled on the importance of reporting any/all new or changed pain symptoms or management strategies to pain management provider Advised patient to report to care team affect of pain on daily activities Discussed use of relaxation techniques and/or diversional activities to assist with pain reduction (distraction, imagery, relaxation, massage, acupressure, TENS, heat, and cold application Reviewed with patient prescribed pharmacological and nonpharmacological pain relief strategies Advised patient to discuss unresolved pain, or changes in level or intensity of pain with provider Screening for signs and symptoms of depression related to chronic disease state  Assessed social determinant of health barriers The patient agrees to work with the [facility] and agrees to regular outreaches. Review of the care coordination program and how the program works. The patient knows how to get in touch with the [facility]  Active listening / Reflection utilized  Emotional Support Provided         Our next appointment is by telephone on 01-08-2022 at 1030 am  Please call the care guide team at  [phone] if you need to cancel or reschedule your appointment.   If you are experiencing a Mental Health or Newborn or need someone to talk to, please call the Suicide and Crisis Lifeline: 988 call the [place] National Suicide Prevention Lifeline: 812-255-6287 or TTY: (478) 722-5667 TTY 671-439-6437) to talk to a trained counselor call 1-800-273-TALK (toll free, 24 hour hotline)  Patient verbalizes understanding of instructions and care plan provided today and agrees to view in [place]. Active MyChart status and patient understanding of how to access instructions and care plan via MyChart confirmed with patient.     Telephone follow up appointment with care management team member scheduled for: 01-08-2022 at 99 am  Noreene Larsson RN, MSN, [facility] Mobile: [phone]     Exercise Information for Aging Adults Staying physically active is important as you age. Physical activity and exercise can help in maintaining quality of life, health, physical function, and reducing falls. The four types of exercises that are best for older adults are endurance, strength, balance, and flexibility. Contact your health care provider before you start any exercise routine. Ask your health care provider what activities are safe for you. What are the risks? Risks associated with exercising include: Overdoing it. This may lead to sore muscles or fatigue. Falls. Injuries. Dehydration. How to do these exercises Endurance exercises Endurance (aerobic) exercises raise your breathing rate and heart rate. Increasing your endurance helps you do everyday tasks and stay healthy. By improving the health of  your body system that includes your heart, lungs, and blood vessels (circulatory system), you may also delay or prevent diseases such as heart disease, diabetes, and weak bones (osteoporosis). Types of endurance exercises  include: Sports. Indoor activities, such as using gym equipment, doing water aerobics, or dancing. Outdoor activities, such as biking or jogging. Tasks around the house, such as gardening, yard work, and heavy household chores like cleaning. Walking, such as hiking or walking around your neighborhood. When doing endurance exercises, make sure you: Are aware of your surroundings. Use safety equipment as directed. Dress in layers when exercising outdoors. Drink plenty of water to stay well hydrated. Build up endurance slowly. Start with 10 minutes at a time, and gradually build up to doing 30 minutes at a time. Unless your health care provider gave you different instructions, aim to exercise for a total of 150 minutes a week. Spread out that time so you are working on endurance 3 or more days a week. Strength exercises Lifting, pulling, or pushing weights helps to strengthen muscles. Having stronger muscles makes it easier to do everyday activities, such as getting up from a chair, climbing stairs, carrying groceries, and playing with grandchildren. Strength exercises include arm and leg exercises that may be done: With weights. Without weights (using your own body weight). With a resistance band. When doing strength exercises: Move smoothly and steadily. Do not suddenly thrust or jerk the weights, the resistance band, or your body. Start with no weights or with light weights, and gradually add more weight over time. Eventually, aim to use weights that are hard or very hard for you to lift. This means that you are able to do 8 repetitions with the weight, and the last few repetitions are very challenging. Lift or push weights into position for 3 seconds, hold the position for 1 second, and then take 3 seconds to return to your starting position. Breathe out (exhale) during difficult movements, like lifting or pushing weights. Breathe in (inhale) to relax your muscles before the next  repetition. Consider alternating arms or legs, especially when you first start strength exercises. Expect some slight muscle soreness after each session. Do strength exercises on 2 or more days a week, for 30 minutes at a time. Avoid exercising the same muscle groups two days in a row. For example, if you work on your leg muscles one day, work on your arm muscles the next day. When you can do two sets of 10-15 repetitions with a certain weight, increase the amount of weight. Balance exercises Balance exercises can help to prevent falls. Balance exercises include: Standing on one foot. Heel-to-toe walk. Balance walk. Tai chi. Make sure you have something sturdy to hold onto while doing balance exercises, such as a sturdy chair. As your balance improves, challenge yourself by holding on to the chair with one hand instead of two, and then with no hands. Trying exercises with your eyes closed also challenges your balance, but be sure to have a sturdy surface (like a countertop) close by in case you need it. Do balance exercises as often as you want, or as often as directed by your health care provider. Flexibility exercises  Flexibility exercises improve how far you can bend, straighten, move, or rotate parts of your body (range of motion). These exercises also help you do everyday activities such as getting dressed or reaching for objects. Flexibility exercises include stretching different parts of the body, and they may be done in a standing or seated  position or on the floor. When stretching, make sure you: Keep a slight bend in your arms and legs. Avoid completely straightening ("locking") your joints. Do not stretch so far that you feel pain. You should feel a mild stretching feeling. You may try stretching farther as you become more flexible over time. Relax and breathe between stretches. Hold on to something sturdy for balance as needed. Hold each stretch for 10-30 seconds. Repeat each stretch  3-5 times. General safety tips Exercise in well-lit areas. Do not hold your breath during exercises or stretches. Warm up before exercising, and cool down after exercising. This can help prevent injury. Drink plenty of water during exercise or any activity that makes you sweat. If you are not sure if an exercise is safe for you, or you are not sure how to do an exercise, talk with your health care provider. This is especially important if you have had surgery on muscles, bones, or joints (orthopedic surgery). Where to find more information You can find more information about exercise for older adults from: Your local health department, fitness center, or community center. These facilities may have programs for aging adults. [employer] on Aging: http://kim-miller.com/ National Council on Aging: www.ncoa.org Summary Staying physically active is important as you age. Doing endurance, strength, balance, and flexibility exercises can help in maintaining quality of life, health, physical function, and reducing falls. Make sure to contact your health care provider before you start any exercise routine. Ask your health care provider what activities are safe for you. This information is not intended to replace advice given to you by your health care provider. Make sure you discuss any questions you have with your health care provider. Document Revised: 07/02/2020 Document Reviewed: 07/02/2020 Elsevier Patient Education  [place].  Managing Chronic Pain This video lists treatment options for managing chronic pain. You will learn about choices that will aid you and your health care provider in choosing the best treatment plan for you. To view the content, go to this web address: https://pe.elsevier.com/[email or web address]  This video will expire on: 08/24/2023. If you need access to this video following this date, please reach out to the healthcare provider who assigned it to you. This information is  not intended to replace advice given to you by your health care provider. Make sure you discuss any questions you have with your health care provider. Elsevier Patient Education  [place].

## 2021-11-18 DIAGNOSIS — M170 Bilateral primary osteoarthritis of knee: Secondary | ICD-10-CM | POA: Diagnosis not present

## 2021-11-25 DIAGNOSIS — M170 Bilateral primary osteoarthritis of knee: Secondary | ICD-10-CM | POA: Diagnosis not present

## 2021-11-25 DIAGNOSIS — M25562 Pain in left knee: Secondary | ICD-10-CM | POA: Diagnosis not present

## 2021-11-25 DIAGNOSIS — M25561 Pain in right knee: Secondary | ICD-10-CM | POA: Diagnosis not present

## 2021-12-02 DIAGNOSIS — M25562 Pain in left knee: Secondary | ICD-10-CM | POA: Diagnosis not present

## 2021-12-02 DIAGNOSIS — M170 Bilateral primary osteoarthritis of knee: Secondary | ICD-10-CM | POA: Diagnosis not present

## 2021-12-02 DIAGNOSIS — M25561 Pain in right knee: Secondary | ICD-10-CM | POA: Diagnosis not present

## 2021-12-04 DIAGNOSIS — H35033 Hypertensive retinopathy, bilateral: Secondary | ICD-10-CM | POA: Diagnosis not present

## 2021-12-16 ENCOUNTER — Encounter: Payer: Self-pay | Admitting: Family Medicine

## 2022-01-08 ENCOUNTER — Ambulatory Visit: Payer: Self-pay | Admitting: *Deleted

## 2022-01-08 NOTE — Patient Instructions (Signed)
Visit Information  Thank you for taking time to visit with me today. Please don't hesitate to contact me if I can be of assistance to you before our next scheduled telephone appointment.  Following are the goals we discussed today:  Keep and attend follow up with ortho provider. Continue monitoring BP daily.  Our next appointment is by telephone on 12/11  Please call the care guide team at [phone] if you need to cancel or reschedule your appointment.   Please call the Suicide and Crisis Lifeline: 988 call the [place] National Suicide Prevention Lifeline: 858-390-4814 or TTY: 407-523-1106 TTY 3014402451) to talk to a trained counselor call 1-800-273-TALK (toll free, 24 hour hotline) call 911 if you are experiencing a Mental Health or Behavioral Health Crisis or need someone to talk to.  Patient verbalizes understanding of instructions and care plan provided today and agrees to view in MyChart. Active MyChart status and patient understanding of how to access instructions and care plan via MyChart confirmed with patient.     The patient has been provided with contact information for the care management team and has been advised to call with any health related questions or concerns.   Kemper Durie, RN, MSN, [facility] Care Management Care Management Coordinator [phone]

## 2022-01-08 NOTE — Patient Outreach (Signed)
  Care Coordination   Follow Up Visit Note   01/08/2022 Name: Lisa Steele MRN: [id number] DOB: 08-14-1945  Lisa Steele is a 76 y.o. year old female who sees [name], MD for primary care. I spoke with  Lisa Steele by phone today.  What matters to the patients health and wellness today?  Knee pain today has decreased with gel injections.  Denies any urgent concerns, encouraged to contact this care manager with questions.     Goals Addressed             This Visit's Progress    RNCM: Effective Management of Pain   On track    Care Coordination Interventions: Reviewed provider established plan for pain management. Patient is seeing the specialist, has had gel injections that have relieved knee pain. Discussed importance of adherence to all scheduled medical appointments. Review and education provided. Follow up with ortho specialist on 11/13 Counseled on the importance of reporting any/all new or changed pain symptoms or management strategies to pain management provider Advised patient to report to care team affect of pain on daily activities Discussed use of relaxation techniques and/or diversional activities to assist with pain reduction (distraction, imagery, relaxation, massage, acupressure, TENS, heat, and cold application Reviewed with patient prescribed pharmacological and nonpharmacological pain relief strategies Advised patient to discuss unresolved pain, or changes in level or intensity of pain with provider The patient agrees to work with the [facility] and agrees to regular outreaches. Review of the care coordination program and how the program works. The patient knows how to get in touch with the [facility] Discussed vaccinations.  Obtained flu and RSV shots.  Last Covid booster was May, will talk to provider to see if it is too soon to take the newest booster shot. Encouraged to continue monitoring blood pressure daily, report readings are 120s/70s usually. Encouraged to use  DME when walking for stability and to decrease fall risk.  Active listening / Reflection utilized  Emotional Support Provided         SDOH assessments and interventions completed:  No     Care Coordination Interventions Activated:  Yes  Care Coordination Interventions:  Yes, provided   Follow up plan: Follow up call scheduled for 12/11    Encounter Outcome:  Pt. Visit Completed   Kemper Durie, RN, MSN, [facility] Care Management Care Management Coordinator [phone]

## 2022-01-13 DIAGNOSIS — M5416 Radiculopathy, lumbar region: Secondary | ICD-10-CM | POA: Diagnosis not present

## 2022-01-13 DIAGNOSIS — M170 Bilateral primary osteoarthritis of knee: Secondary | ICD-10-CM | POA: Diagnosis not present

## 2022-01-15 DIAGNOSIS — M5126 Other intervertebral disc displacement, lumbar region: Secondary | ICD-10-CM | POA: Diagnosis not present

## 2022-01-15 DIAGNOSIS — M5416 Radiculopathy, lumbar region: Secondary | ICD-10-CM | POA: Diagnosis not present

## 2022-01-15 DIAGNOSIS — M48061 Spinal stenosis, lumbar region without neurogenic claudication: Secondary | ICD-10-CM | POA: Diagnosis not present

## 2022-01-15 DIAGNOSIS — M5127 Other intervertebral disc displacement, lumbosacral region: Secondary | ICD-10-CM | POA: Diagnosis not present

## 2022-01-20 DIAGNOSIS — M5126 Other intervertebral disc displacement, lumbar region: Secondary | ICD-10-CM | POA: Diagnosis not present

## 2022-01-20 DIAGNOSIS — M5416 Radiculopathy, lumbar region: Secondary | ICD-10-CM | POA: Diagnosis not present

## 2022-02-07 DIAGNOSIS — M5136 Other intervertebral disc degeneration, lumbar region: Secondary | ICD-10-CM | POA: Diagnosis not present

## 2022-02-07 DIAGNOSIS — M25551 Pain in right hip: Secondary | ICD-10-CM | POA: Diagnosis not present

## 2022-02-07 DIAGNOSIS — M5416 Radiculopathy, lumbar region: Secondary | ICD-10-CM | POA: Diagnosis not present

## 2022-02-07 DIAGNOSIS — M48061 Spinal stenosis, lumbar region without neurogenic claudication: Secondary | ICD-10-CM | POA: Diagnosis not present

## 2022-02-07 DIAGNOSIS — M47896 Other spondylosis, lumbar region: Secondary | ICD-10-CM | POA: Diagnosis not present

## 2022-02-07 DIAGNOSIS — M545 Low back pain, unspecified: Secondary | ICD-10-CM | POA: Diagnosis not present

## 2022-02-10 ENCOUNTER — Ambulatory Visit: Payer: Self-pay | Admitting: *Deleted

## 2022-02-10 NOTE — Patient Instructions (Signed)
Visit Information  Thank you for taking time to visit with me today. Please don't hesitate to contact me if I can be of assistance to you before our next scheduled telephone appointment.  Following are the goals we discussed today:  Keep and attend appointment with ortho on 1/18.   Continue monitoring blood pressure daily, recording readings.   Our next appointment is by telephone on 1/19  Please call the care guide team at [phone] if you need to cancel or reschedule your appointment.   Please call the Suicide and Crisis Lifeline: 988 call the [place] National Suicide Prevention Lifeline: 234-109-9618 or TTY: 330-633-2523 TTY 8737947315) to talk to a trained counselor call 1-800-273-TALK (toll free, 24 hour hotline) call 911 if you are experiencing a Mental Health or Behavioral Health Crisis or need someone to talk to.  Patient verbalizes understanding of instructions and care plan provided today and agrees to view in MyChart. Active MyChart status and patient understanding of how to access instructions and care plan via MyChart confirmed with patient.     The patient has been provided with contact information for the care management team and has been advised to call with any health related questions or concerns.   Kemper Durie, RN, MSN, [facility] Care Management Care Management Coordinator [phone]

## 2022-02-10 NOTE — Patient Outreach (Signed)
  Care Coordination   Follow Up Visit Note   02/10/2022 Name: Lisa Steele MRN: [id number] DOB: 16-Mar-1945  Lisa Steele is a 76 y.o. year old female who sees [name], MD for primary care. I spoke with  Lisa Steele by phone today.  What matters to the patients health and wellness today?  Continues with chronic back and knee pain.    Goals Addressed             This Visit's Progress    RNCM: Effective Management of Pain   On track    Care Coordination Interventions: Reviewed provider established plan for pain management. Patient is seeing the specialist, has had gel injections that initially relieved pain, now still having chronic pain.  Had MRI, found 3 herniated discs, waiting for insurance approval to receive back injections Discussed importance of adherence to all scheduled medical appointments. Review and education provided. Follow up with ortho specialist on 1/18 Counseled on the importance of reporting any/all new or changed pain symptoms or management strategies to pain management provider Advised patient to report to care team affect of pain on daily activities Discussed use of relaxation techniques and/or diversional activities to assist with pain reduction (distraction, imagery, relaxation, massage, acupressure, TENS, heat, and cold application Reviewed with patient prescribed pharmacological and nonpharmacological pain relief strategies Advised patient to discuss unresolved pain, or changes in level or intensity of pain with provider The patient agrees to work with the [facility] and agrees to regular outreaches. Review of the care coordination program and how the program works. The patient knows how to get in touch with the [facility] Discussed follow up appointments: ortho on 1/18, AWV on 1/23, and PCP on 2/8 Encouraged to continue monitoring blood pressure daily, report readings are "good" but does report reading at ortho office last week was elevated.  The office uses a  wrist machine, blood pressure was back down to her normal when she got back home Encouraged to use DME when walking for stability and to decrease fall risk.  Active listening / Reflection utilized  Emotional Support Provided         SDOH assessments and interventions completed:  No     Care Coordination Interventions:  Yes, provided   Follow up plan: Follow up call scheduled for 1/19    Encounter Outcome:  Pt. Visit Completed   Kemper Durie, RN, MSN, [facility] Care Management Care Management Coordinator [phone]

## 2022-02-14 DIAGNOSIS — M5416 Radiculopathy, lumbar region: Secondary | ICD-10-CM | POA: Diagnosis not present

## 2022-02-25 ENCOUNTER — Other Ambulatory Visit: Payer: Self-pay | Admitting: Family Medicine

## 2022-02-25 DIAGNOSIS — I10 Essential (primary) hypertension: Secondary | ICD-10-CM

## 2022-03-20 DIAGNOSIS — M5416 Radiculopathy, lumbar region: Secondary | ICD-10-CM | POA: Diagnosis not present

## 2022-03-20 DIAGNOSIS — M545 Low back pain, unspecified: Secondary | ICD-10-CM | POA: Diagnosis not present

## 2022-03-20 DIAGNOSIS — M25561 Pain in right knee: Secondary | ICD-10-CM | POA: Diagnosis not present

## 2022-03-20 DIAGNOSIS — M47896 Other spondylosis, lumbar region: Secondary | ICD-10-CM | POA: Diagnosis not present

## 2022-03-20 DIAGNOSIS — M170 Bilateral primary osteoarthritis of knee: Secondary | ICD-10-CM | POA: Diagnosis not present

## 2022-03-20 DIAGNOSIS — M25562 Pain in left knee: Secondary | ICD-10-CM | POA: Diagnosis not present

## 2022-03-20 DIAGNOSIS — M25551 Pain in right hip: Secondary | ICD-10-CM | POA: Diagnosis not present

## 2022-03-20 DIAGNOSIS — M5136 Other intervertebral disc degeneration, lumbar region: Secondary | ICD-10-CM | POA: Diagnosis not present

## 2022-03-20 DIAGNOSIS — M48061 Spinal stenosis, lumbar region without neurogenic claudication: Secondary | ICD-10-CM | POA: Diagnosis not present

## 2022-03-21 ENCOUNTER — Ambulatory Visit: Payer: Self-pay | Admitting: *Deleted

## 2022-03-21 DIAGNOSIS — M25561 Pain in right knee: Secondary | ICD-10-CM | POA: Diagnosis not present

## 2022-03-21 DIAGNOSIS — M170 Bilateral primary osteoarthritis of knee: Secondary | ICD-10-CM | POA: Diagnosis not present

## 2022-03-21 NOTE — Patient Outreach (Signed)
  Care Coordination   Follow Up Visit Note   03/21/2022 Name: Lisa Steele MRN: [id number] DOB: 1945/08/12  Lisa Steele is a 77 y.o. year old female who sees Steele Sizer, MD for primary care. I spoke with  Lisa Steele by phone today.  What matters to the patients health and wellness today?  Back pain has been resolved, now working on resolving knee pain    Goals Addressed             This Visit's Progress    RNCM: Effective Management of Pain   On track    Care Coordination Interventions: Reviewed provider established plan for pain management. Patient is seeing the specialist, has had gel injections that initially relieved pain, now still having chronic pain.  Had MRI, found 3 herniated discs, waiting for insurance approval to receive back injections - 11/19 - injection received 12/19, report no pain since in back Discussed importance of adherence to all scheduled medical appointments. Review and education provided.  Counseled on the importance of reporting any/all new or changed pain symptoms or management strategies to pain management provider Advised patient to report to care team affect of pain on daily activities Discussed use of relaxation techniques and/or diversional activities to assist with pain reduction (distraction, imagery, relaxation, massage, acupressure, TENS, heat, and cold application Reviewed with patient prescribed pharmacological and nonpharmacological pain relief strategies Advised patient to discuss unresolved pain, or changes in level or intensity of pain with provider The patient agrees to work with the [facility] and agrees to regular outreaches. Review of the care coordination program and how the program works. The patient knows how to get in touch with the [facility] Discussed follow up appointments: ortho today for knee assessment, AWV on 1/23, and PCP on 2/8        SDOH assessments and interventions completed:  No     Care Coordination Interventions:   Yes, provided   Follow up plan: Follow up call scheduled for 3/19    Encounter Outcome:  Pt. Visit Completed   Lisa David, RN, MSN, [place] Care Management Care Management Coordinator [phone]

## 2022-03-21 NOTE — Patient Instructions (Signed)
Visit Information  Thank you for taking time to visit with me today. Please don't hesitate to contact me if I can be of assistance to you before our next scheduled telephone appointment.  Our next appointment is by telephone on 3/19  Please call the care guide team at [phone] if you need to cancel or reschedule your appointment.   Please call the Suicide and Crisis Lifeline: 988 call the [place] National Suicide Prevention Lifeline: 720-173-5584 or TTY: (709) 871-1798 TTY (807)233-6847) to talk to a trained counselor call 1-800-273-TALK (toll free, 24 hour hotline) call 911 if you are experiencing a Mental Health or [place] or need someone to talk to.  Patient verbalizes understanding of instructions and care plan provided today and agrees to view in [place]. Active MyChart status and patient understanding of how to access instructions and care plan via MyChart confirmed with patient.     The patient has been provided with contact information for the care management team and has been advised to call with any health related questions or concerns.   Valente David, RN, MSN, [place] Care Management Care Management Coordinator [phone]

## 2022-03-25 ENCOUNTER — Telehealth (INDEPENDENT_AMBULATORY_CARE_PROVIDER_SITE_OTHER): Payer: Medicare Other | Admitting: Physician Assistant

## 2022-03-25 DIAGNOSIS — Z0000 Encounter for general adult medical examination without abnormal findings: Secondary | ICD-10-CM

## 2022-03-25 NOTE — Patient Instructions (Addendum)
Lisa Steele ,  Thank you for taking time to come for your Medicare Wellness Visit. I appreciate your ongoing commitment to your health goals. Please review the following plan we discussed and let me know if I can assist you in the future.   Screening recommendations/referrals: Colonoscopy: Completed in 2023, repeat in 10 years  Mammogram: Completed 09/17/21, repeat every year  Bone Density: Completed in 2022, normal results, you can repeat this every 2 years  Recommended yearly ophthalmology/optometry visit for glaucoma screening and checkup Recommended yearly dental visit for hygiene and checkup  Vaccinations: Influenza vaccine: Completed for the 2023 flu season Pneumococcal vaccine: Completed vaccine course  Tdap vaccine: Up to date, next due in 2029 Shingles vaccine: Completed vaccine course    Covid-19: Please stay up to date on the vaccine and booster recommendations for your age group.    Advanced directives: It appears that you have completed some of your advanced directives but the office does not have this on file. Please bring a copy to your next apt so we can keep your records updated.  Conditions/risks identified: None  Next appointment: Follow up in one year for your annual wellness visit    Preventive Care 65 Years and Older, Female Preventive care refers to lifestyle choices and visits with your health care provider that can promote health and wellness. What does preventive care include? A yearly physical exam. This is also called an annual well check. Dental exams once or twice a year. Routine eye exams. Ask your health care provider how often you should have your eyes checked. Personal lifestyle choices, including: Daily care of your teeth and gums. Regular physical activity. Eating a healthy diet. Avoiding tobacco and drug use. Limiting alcohol use. Practicing safe sex. Taking low-dose aspirin every day. Taking vitamin and mineral supplements as recommended by  your health care provider. What happens during an annual well check? The services and screenings done by your health care provider during your annual well check will depend on your age, overall health, lifestyle risk factors, and family history of disease. Counseling  Your health care provider may ask you questions about your: Alcohol use. Tobacco use. Drug use. Emotional well-being. Home and relationship well-being. Sexual activity. Eating habits. History of falls. Memory and ability to understand (cognition). Work and work Statistician. Reproductive health. Screening  You may have the following tests or measurements: Height, weight, and BMI. Blood pressure. Lipid and cholesterol levels. These may be checked every 5 years, or more frequently if you are over 46 years old. Skin check. Lung cancer screening. You may have this screening every year starting at age 45 if you have a 30-pack-year history of smoking and currently smoke or have quit within the past 15 years. Fecal occult blood test (FOBT) of the stool. You may have this test every year starting at age 66. Flexible sigmoidoscopy or colonoscopy. You may have a sigmoidoscopy every 5 years or a colonoscopy every 10 years starting at age 11. Hepatitis C blood test. Hepatitis B blood test. Sexually transmitted disease (STD) testing. Diabetes screening. This is done by checking your blood sugar (glucose) after you have not eaten for a while (fasting). You may have this done every 1-3 years. Bone density scan. This is done to screen for osteoporosis. You may have this done starting at age 46. Mammogram. This may be done every 1-2 years. Talk to your health care provider about how often you should have regular mammograms. Talk with your health care provider about  your test results, treatment options, and if necessary, the need for more tests. Vaccines  Your health care provider may recommend certain vaccines, such as: Influenza  vaccine. This is recommended every year. Tetanus, diphtheria, and acellular pertussis (Tdap, Td) vaccine. You may need a Td booster every 10 years. Zoster vaccine. You may need this after age 31. Pneumococcal 13-valent conjugate (PCV13) vaccine. One dose is recommended after age 76. Pneumococcal polysaccharide (PPSV23) vaccine. One dose is recommended after age 52. Talk to your health care provider about which screenings and vaccines you need and how often you need them. This information is not intended to replace advice given to you by your health care provider. Make sure you discuss any questions you have with your health care provider. Document Released: 03/16/2015 Document Revised: 11/07/2015 Document Reviewed: 12/19/2014 Elsevier Interactive Patient Education  2017 [place] Prevention in the Home Falls can cause injuries. They can happen to people of all ages. There are many things you can do to make your home safe and to help prevent falls. What can I do on the outside of my home? Regularly fix the edges of walkways and driveways and fix any cracks. Remove anything that might make you trip as you walk through a door, such as a raised step or threshold. Trim any bushes or trees on the path to your home. Use bright outdoor lighting. Clear any walking paths of anything that might make someone trip, such as rocks or tools. Regularly check to see if handrails are loose or broken. Make sure that both sides of any steps have handrails. Any raised decks and porches should have guardrails on the edges. Have any leaves, snow, or ice cleared regularly. Use sand or salt on walking paths during winter. Clean up any spills in your garage right away. This includes oil or grease spills. What can I do in the bathroom? Use night lights. Install grab bars by the toilet and in the tub and shower. Do not use towel bars as grab bars. Use non-skid mats or decals in the tub or shower. If you  need to sit down in the shower, use a plastic, non-slip stool. Keep the floor dry. Clean up any water that spills on the floor as soon as it happens. Remove soap buildup in the tub or shower regularly. Attach bath mats securely with double-sided non-slip rug tape. Do not have throw rugs and other things on the floor that can make you trip. What can I do in the bedroom? Use night lights. Make sure that you have a light by your bed that is easy to reach. Do not use any sheets or blankets that are too big for your bed. They should not hang down onto the floor. Have a firm chair that has side arms. You can use this for support while you get dressed. Do not have throw rugs and other things on the floor that can make you trip. What can I do in the kitchen? Clean up any spills right away. Avoid walking on wet floors. Keep items that you use a lot in easy-to-reach places. If you need to reach something above you, use a strong step stool that has a grab bar. Keep electrical cords out of the way. Do not use floor polish or wax that makes floors slippery. If you must use wax, use non-skid floor wax. Do not have throw rugs and other things on the floor that can make you trip. What can I do with  my stairs? Do not leave any items on the stairs. Make sure that there are handrails on both sides of the stairs and use them. Fix handrails that are broken or loose. Make sure that handrails are as long as the stairways. Check any carpeting to make sure that it is firmly attached to the stairs. Fix any carpet that is loose or worn. Avoid having throw rugs at the top or bottom of the stairs. If you do have throw rugs, attach them to the floor with carpet tape. Make sure that you have a light switch at the top of the stairs and the bottom of the stairs. If you do not have them, ask someone to add them for you. What else can I do to help prevent falls? Wear shoes that: Do not have high heels. Have rubber  bottoms. Are comfortable and fit you well. Are closed at the toe. Do not wear sandals. If you use a stepladder: Make sure that it is fully opened. Do not climb a closed stepladder. Make sure that both sides of the stepladder are locked into place. Ask someone to hold it for you, if possible. Clearly mark and make sure that you can see: Any grab bars or handrails. First and last steps. Where the edge of each step is. Use tools that help you move around (mobility aids) if they are needed. These include: Canes. Walkers. Scooters. Crutches. Turn on the lights when you go into a dark area. Replace any light bulbs as soon as they burn out. Set up your furniture so you have a clear path. Avoid moving your furniture around. If any of your floors are uneven, fix them. If there are any pets around you, be aware of where they are. Review your medicines with your doctor. Some medicines can make you feel dizzy. This can increase your chance of falling. Ask your doctor what other things that you can do to help prevent falls. This information is not intended to replace advice given to you by your health care provider. Make sure you discuss any questions you have with your health care provider. Document Released: 12/14/2008 Document Revised: 07/26/2015 Document Reviewed: 03/24/2014 Elsevier Interactive Patient Education  2017 [employer].

## 2022-03-25 NOTE — Progress Notes (Signed)
Annual Wellness Visit  Virtual Visit via Video Note  I connected with Lisa Steele on 03/25/22 at  9:20 AM EST by a video enabled telemedicine application and verified that I am speaking with the correct person using two identifiers.  Location: Patient: at home Provider: [place], [place]    I discussed the limitations of evaluation and management by telemedicine and the availability of in person appointments. The patient expressed understanding and agreed to proceed.  Today's Provider: Talitha Givens, MHS, PA-C Introduced myself to the patient as a PA-C and provided education on APPs in clinical practice.    Subjective:   Lisa Steele is a 77 y.o. female who presents for Medicare Annual (Subsequent) preventive examination.  Review of Systems:   Cardiac Risk Factors include: advanced age (>75men, >81 women);hypertension;dyslipidemia     Objective:     Vitals: There were no vitals taken for this visit.  There is no height or weight on file to calculate BMI.     04/11/2021    8:39 AM 03/21/2021    9:25 AM 08/30/2020    6:23 AM 08/17/2020    5:01 PM 08/09/2020    1:56 PM 03/20/2020    9:25 AM 06/16/2019   12:09 PM  Advanced Directives  Does Patient Have a Medical Advance Directive? Yes Yes Yes Yes No Yes Yes  Type of Industrial/product designer of [place];Living will Living will  [place];Living will [place];Living will  Does patient want to make changes to medical advance directive?  Yes (MAU/Ambulatory/Procedural Areas - Information given) No - Patient declined    No - Patient declined  Copy of [place] in Chart? No - copy requested No - copy requested    No - copy requested No - copy requested  Would patient like information on creating a medical advance directive?     No - Patient declined  No - Patient declined     Tobacco Social History   Tobacco Use  Smoking Status Former   Years: 10.00   Types: Cigarettes   Start date: 03/03/1958   Quit date: 1980   Years since quitting: 44.0  Smokeless Tobacco Never     Counseling given: Not Answered   Clinical Intake:  Pre-visit preparation completed: Yes  Pain : 0-10 Pain Score: 4  Pain Type: Acute pain Pain Location: Ear Pain Orientation: Right Pain Descriptors / Indicators: Aching Pain Onset: Yesterday     Nutritional Status: BMI > 30  Obese Nutritional Risks: None Diabetes: No  How often do you need to have someone help you when you read instructions, pamphlets, or other written materials from your doctor or pharmacy?: 1 - Never What is the last grade level you completed in school?: 12th grade  Interpreter Needed?: No     Past Medical History:  Diagnosis Date   1st degree AV block    Cataracts, bilateral    DJD (degenerative joint disease)    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 Raul Del   Past Surgical History:  Procedure Laterality Date   APPENDECTOMY     BREAST EXCISIONAL BIOPSY Left 1970   neg   BREAST SURGERY  Left    cyst removed   BUNIONECTOMY Left 2008   CATARACT EXTRACTION, BILATERAL  10/2013   COLONOSCOPY     2005, 2009, 2013   COLONOSCOPY WITH PROPOFOL N/A 06/25/2016   Procedure: COLONOSCOPY WITH PROPOFOL;  Surgeon: Manya Silvas, MD;  Location: [facility] ENDOSCOPY;  Service: Endoscopy;  Laterality: N/A;   COLONOSCOPY WITH PROPOFOL N/A 04/11/2021   Procedure: COLONOSCOPY WITH PROPOFOL;  Surgeon: Jonathon Bellows, MD;  Location: [facility] ENDOSCOPY;  Service: Gastroenterology;  Laterality: N/A;   ECTOPIC PREGNANCY SURGERY  1978   EYE SURGERY     JOINT REPLACEMENT     SHOULDER ARTHROSCOPY WITH ROTATOR CUFF REPAIR AND SUBACROMIAL DECOMPRESSION Left 11/25/2017   Procedure:  SHOULDER ARTHROSCOPic release of long head biceps tendon and mini open rotator cuff repair;  Surgeon: Leanor Kail, MD;  Location: [facility] ORS;  Service: Orthopedics;  Laterality: Left;   TONSILECTOMY, ADENOIDECTOMY, BILATERAL MYRINGOTOMY AND TUBES     TONSILLECTOMY     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   Procedure: TOTAL HIP ARTHROPLASTY ANTERIOR APPROACH;  Surgeon: Hessie Knows, MD;  Location: [facility] ORS;  Service: Orthopedics;  Laterality: Right;   TOTAL HIP ARTHROPLASTY Right 08/30/2020   Procedure: Right hip heterotopic ossification removal;  Surgeon: Hessie Knows, MD;  Location: [facility] ORS;  Service: Orthopedics;  Laterality: Right;   TUBAL LIGATION     Family History  Problem Relation Age of Onset   Diabetes Mother    Hypertension Mother    Heart disease Mother    Hyperlipidemia Mother    Heart disease Father    Diabetes Sister    Heart disease Sister    Hypertension Sister    Hyperlipidemia Sister    Breast cancer Sister 50   Cancer Sister 62       breast   Breast cancer Maternal Aunt    Diabetes Sister    Stroke Sister    Diabetes Sister    Diabetes Sister    Social History   Socioeconomic History   Marital status: Married    Spouse name: Elonda Husky    Number of children: 1   Years of education: Not on file   Highest education level: Associate degree: academic program  Occupational History   Occupation: Counsellor     Comment: [employer]   Tobacco Use   Smoking status: Former    Years: 10.00    Types: Cigarettes    Start date: 03/03/1958    Quit date: 1980    Years since quitting: 44.0   Smokeless tobacco: Never  Vaping Use   Vaping Use: Never used  Substance and Sexual Activity   Alcohol use: Yes    Alcohol/week: 0.0 standard drinks of alcohol    Comment: socially   Drug use: No   Sexual activity: Not Currently    Comment: husband has ED  Other Topics Concern   Not on file  Social History Narrative   Married, son lives in  [place] and has 3 grandchildren , youngest born 10/2016   Social Determinants of Health   Financial Resource Strain: Low Risk  (11/06/2021)   Overall Financial Resource Strain (CARDIA)    Difficulty of Paying Living Expenses: Not hard at all  Food Insecurity: No Food Insecurity (11/13/2021)   Hunger Vital Sign    Worried About Running Out of Food in the Last Year: Never true    Ran Out of Food in the Last Year: Never true  Transportation Needs: No Transportation Needs (11/06/2021)   PRAPARE -  Administrator, Civil Service (Medical): No    Lack of Transportation (Non-Medical): No  Physical Activity: Inactive (03/21/2021)   Exercise Vital Sign    Days of Exercise per Week: 0 days    Minutes of Exercise per Session: 0 min  Stress: No Stress Concern Present (11/06/2021)   [employer] of Occupational Health - Occupational Stress Questionnaire    Feeling of Stress : Not at all  Social Connections: Socially Integrated (03/21/2021)   Social Connection and Isolation Panel [NHANES]    Frequency of Communication with Friends and Family: More than three times a week    Frequency of Social Gatherings with Friends and Family: More than three times a week    Attends Religious Services: More than 4 times per year    Active Member of [employer] or Organizations: Yes    Attends Banker Meetings: More than 4 times per year    Marital Status: Married    Outpatient Encounter Medications as of 03/25/2022  Medication Sig   aspirin EC 81 MG tablet Take 1 tablet (81 mg total) by mouth every evening. Swallow whole.   clindamycin (CLEOCIN) 150 MG capsule Take 150 mg by mouth as needed (Take 4 caps 1 hour prior to appt.).   mometasone (ELOCON) 0.1 % lotion Apply 1 each topically daily.   olmesartan-hydrochlorothiazide (BENICAR HCT) 40-25 MG tablet TAKE 1 TABLET BY MOUTH DAILY   Polyethyl Glycol-Propyl Glycol (SYSTANE) 0.4-0.3 % SOLN Place 1 drop into both eyes at bedtime.   pregabalin  (LYRICA) 50 MG capsule Take 1-3 capsules (50-150 mg total) by mouth 3 (three) times daily.   rosuvastatin (CRESTOR) 20 MG tablet Take 1 tablet (20 mg total) by mouth daily.   No facility-administered encounter medications on file as of 03/25/2022.    Activities of Daily Living    03/25/2022    9:24 AM 03/25/2022    8:48 AM  In your present state of health, do you have any difficulty performing the following activities:  Hearing?  0  Vision?  0  Difficulty concentrating or making decisions?  0  Walking or climbing stairs?  0  Dressing or bathing?  0  Doing errands, shopping?  0  Preparing Food and eating ? N   Using the Toilet? N   In the past six months, have you accidently leaked urine? N   Do you have problems with loss of bowel control? N   Managing your Medications? N   Managing your Finances? N   Housekeeping or managing your Housekeeping? N     Patient Care Team: [name], MD as PCP - General (Family Medicine) Alwyn Pea, MD as Consulting Physician (Cardiology) Kennedy Bucker, MD as Consulting Physician (Orthopedic Surgery) Mertie Moores, MD as Referring Physician (Specialist)    Assessment:   This is a routine wellness examination for Shaquel.  Exercise Activities and Dietary recommendations Current Exercise Habits: Home exercise routine, Type of exercise: walking (Tries to get 3000 steps per day), Intensity: Mild, Exercise limited by: orthopedic condition(s) (has problems with knees)   Goals Addressed   None     Fall Risk:    03/25/2022    8:48 AM 10/10/2021    8:01 AM 04/05/2021   10:46 AM 03/21/2021    9:28 AM 10/29/2020   11:39 AM  Fall Risk   Falls in the past year? 0 0 0 0 0  Number falls in past yr: 0 0 0 0 0  Injury with Fall?  0 0 0 0 0  Risk for fall due to : No Fall Risks No Fall Risks No Fall Risks No Fall Risks No Fall Risks  Follow up Falls prevention discussed;Education provided;Falls evaluation completed Falls prevention  discussed Falls prevention discussed Falls prevention discussed Falls prevention discussed    FALL RISK PREVENTION PERTAINING TO THE HOME:  Any stairs in or around the home? Yes  If so, are there any without handrails? Yes   Home free of loose throw rugs in walkways, pet beds, electrical cords, etc? Yes  Adequate lighting in your home to reduce risk of falls? Yes   ASSISTIVE DEVICES UTILIZED TO PREVENT FALLS:  Life alert? No  Use of a cane, walker or w/c? Yes  Grab bars in the bathroom? No  Shower chair or bench in shower? Yes  Elevated toilet seat or a handicapped toilet? Yes   DME ORDERS:  DME order needed?  No   TIMED UP AND GO:  Was the test performed?  No video visit .  Length of time to ambulate 10 feet: 0 sec.     Depression Screen    03/25/2022    8:48 AM 11/06/2021   11:50 AM 10/10/2021    8:01 AM 04/05/2021   10:46 AM  PHQ 2/9 Scores  PHQ - 2 Score 0 0 0 0  PHQ- 9 Score 0  0 0     Cognitive Function        03/25/2022    9:26 AM 03/09/2018    3:19 PM  6CIT Screen  What Year? 0 points 0 points  What month? 0 points 0 points  What time? 0 points 0 points  Count back from 20 0 points 0 points  Months in reverse 0 points 0 points  Repeat phrase 2 points 0 points  Total Score 2 points 0 points    Immunization History  Administered Date(s) Administered   Fluad Quad(high Dose 65+) 11/15/2018   Influenza, High Dose Seasonal PF 02/14/2016, 11/12/2016, 01/12/2018, 10/28/2019, 12/03/2020, 12/16/2021   Influenza,inj,Quad PF,6+ Mos 02/10/2014, 01/29/2015   Influenza-Unspecified 02/10/2014, 01/29/2015, 02/14/2016, 11/12/2016   PFIZER(Purple Top)SARS-COV-2 Vaccination 03/25/2019, 04/15/2019, 12/12/2019, 06/08/2020   Pfizer Covid-19 Vaccine Bivalent Booster 83yrs & up 12/24/2020, 07/11/2021   Pneumococcal Conjugate-13 08/14/2014   Pneumococcal Polysaccharide-23 01/27/2012   Rsv, Bivalent, Protein Subunit Rsvpref,pf Evans Lance) 11/01/2021   Tdap 12/27/2010,  08/19/2017   Zoster Recombinat (Shingrix) 11/22/2018, 10/28/2019   Zoster, Live 01/01/2011    Qualifies for Shingles Vaccine? Completed Zoster   Tdap: Up to date  Flu Vaccine:Up to date  Pneumococcal Vaccine: Completed   Covid-19 Vaccine: Completed vaccines  Screening Tests Health Maintenance  Topic Date Due   COVID-19 Vaccine (7 - 2023-24 season) 11/01/2021   Medicare Annual Wellness (AWV)  03/21/2022   DTaP/Tdap/Td (3 - Td or Tdap) 08/20/2027   COLONOSCOPY (Pts 45-53yrs Insurance coverage will need to be confirmed)  04/12/2031   Pneumonia Vaccine 27+ Years old  Completed   INFLUENZA VACCINE  Completed   DEXA SCAN  Completed   Hepatitis C Screening  Completed   Zoster Vaccines- Shingrix  Completed   HPV VACCINES  Aged Out    Cancer Screenings:  Colorectal Screening: Completed 04/11/2021. Repeat every 10 years  Mammogram: Completed 09/17/2021. Repeat every year  Bone Density: Completed 09/14/2020. Results reflect NORMAL. Repeat every 2 years.   Lung Cancer Screening: (Low Dose CT Chest recommended if Age 65-80 years, 30 pack-year currently smoking OR have quit w/in 15years.) does not qualify.  Lung Cancer Screening Referral: An Epic message has been sent to Burgess Estelle, RN (Oncology Nurse Navigator) regarding the possible need for this exam. Raquel Sarna will review the patient's chart to determine if the patient truly qualifies for the exam. If the patient qualifies, Raquel Sarna will order the Low Dose CT of the chest to facilitate the scheduling of this exam.  Additional Screening:  Hepatitis C Screening: Completed 01/27/2012  Vision Screening: Recommended annual ophthalmology exams for early detection of glaucoma and other disorders of the eye. Is the patient up to date with their annual eye exam?  Yes  Who is the provider or what is the name of the office in which the pt attends annual eye exams? [facility]   Dental Screening: Recommended annual dental exams for proper  oral hygiene  Community Resource Referral:  CRR required this visit?  No       Plan:  I have personally reviewed and addressed the Medicare Annual Wellness questionnaire and have noted the following in the patient's chart:  A. Medical and social history B. Use of alcohol, tobacco or illicit drugs  C. Current medications and supplements D. Functional ability and status E.  Nutritional status F.  Physical activity G. Advance directives H. List of other physicians I.  Hospitalizations, surgeries, and ER visits in previous 12 months J.  [place] such as hearing and vision if needed, cognitive and depression L. Referrals and appointments   In addition, I have reviewed and discussed with patient certain preventive protocols, quality metrics, and best practice recommendations. A written personalized care plan for preventive services as well as general preventive health recommendations were provided to patient.  Signed,    Talitha Givens, MHS, PA-C [facility]

## 2022-03-27 DIAGNOSIS — M25561 Pain in right knee: Secondary | ICD-10-CM | POA: Diagnosis not present

## 2022-03-27 DIAGNOSIS — M48061 Spinal stenosis, lumbar region without neurogenic claudication: Secondary | ICD-10-CM | POA: Diagnosis not present

## 2022-03-27 DIAGNOSIS — M5136 Other intervertebral disc degeneration, lumbar region: Secondary | ICD-10-CM | POA: Diagnosis not present

## 2022-03-27 DIAGNOSIS — M545 Low back pain, unspecified: Secondary | ICD-10-CM | POA: Diagnosis not present

## 2022-03-27 DIAGNOSIS — M170 Bilateral primary osteoarthritis of knee: Secondary | ICD-10-CM | POA: Diagnosis not present

## 2022-03-27 DIAGNOSIS — M5416 Radiculopathy, lumbar region: Secondary | ICD-10-CM | POA: Diagnosis not present

## 2022-03-27 DIAGNOSIS — M47896 Other spondylosis, lumbar region: Secondary | ICD-10-CM | POA: Diagnosis not present

## 2022-03-27 DIAGNOSIS — M25562 Pain in left knee: Secondary | ICD-10-CM | POA: Diagnosis not present

## 2022-03-27 DIAGNOSIS — M25551 Pain in right hip: Secondary | ICD-10-CM | POA: Diagnosis not present

## 2022-04-04 ENCOUNTER — Encounter: Payer: Self-pay | Admitting: Family Medicine

## 2022-04-09 DIAGNOSIS — M5416 Radiculopathy, lumbar region: Secondary | ICD-10-CM | POA: Diagnosis not present

## 2022-04-09 NOTE — Progress Notes (Addendum)
Name: Lisa Steele   MRN: [id number]    DOB: [birth year]   Date:04/10/2022       Progress Note  Subjective  Chief Complaint  Follow Up  HPI  HTN: taking bp medication daily. She denies SOB , palpitation or  dizziness. No side effects of medication . BP is at goal.   Left ankle swelling: going on for the past week, she just notice, no pain or redness, no change in activity.   Rash: she noticed a rash 3 months ago on posterior left axilla, she states sometimes is red, she switched to hospital deodorant and is not causing irritation.   Chest pain: she was seen by Dr. Clayborn Bigness and had negative stress test, she recently went back for follow up, no longer has symptoms and doing well   Ear dryness: she was seen by ENT and given mometasone she is using it prn    Hyperlipidemia: taking Crestor, no myalgias, compliant with medications. Last LDL was 101, she is now taking higher dose of statin and we will recheck labs today   Back pain with radiculitis down right leg, seen at [facility] and had MRI lumbar spine that showed herniated disc, she had one steroid injection and symptoms resolved , she stopped Lyrica   OSA: she sees Dr. Raul Del, she is back on full mask and tolerating it well. She has been feeling better when she wakes up. Compliant   Morbid obesity: BMI above 35 with multiple co-morbidities, such as dyslipidemia , HTN and OA. Weight is up a little since last visit   OA:she switched to [facility], having pain on both knees , but right is currently worse than left side. She is trying to hold off on having surgery . She had a nerve block done yesterday   Patient Active Problem List   Diagnosis Date Noted   Morbid obesity ([place]) 04/05/2021   OSA on CPAP 03/21/2021   Postoperative heterotopic ossification 08/30/2020   Status post total hip replacement, right 06/16/2019   Trigger finger of left thumb 03/04/2018   S/P rotator cuff repair 01/12/2018   Injury of left rotator cuff  11/03/2017   Primary osteoarthritis of both knees 0000000   Dysmetabolic syndrome 0000000   Post-menopausal 06/22/2014   H/O cold sores 06/22/2014   Dyslipidemia 06/22/2014   Elevated fasting blood sugar 06/22/2014   Benign essential HTN 06/22/2014   Abnormal ECG 06/22/2014    Past Surgical History:  Procedure Laterality Date   APPENDECTOMY     BREAST EXCISIONAL BIOPSY Left 1970   neg   BREAST SURGERY Left    cyst removed   BUNIONECTOMY Left 2008   CATARACT EXTRACTION, BILATERAL  10/2013   COLONOSCOPY     2005, 2009, 2013   COLONOSCOPY WITH PROPOFOL N/A 06/25/2016   Procedure: COLONOSCOPY WITH PROPOFOL;  Surgeon: Manya Silvas, MD;  Location: [facility];  Service: Endoscopy;  Laterality: N/A;   COLONOSCOPY WITH PROPOFOL N/A 04/11/2021   Procedure: COLONOSCOPY WITH PROPOFOL;  Surgeon: Jonathon Bellows, MD;  Location: [facility] ENDOSCOPY;  Service: Gastroenterology;  Laterality: N/A;   ECTOPIC PREGNANCY SURGERY  1978   EYE SURGERY     JOINT REPLACEMENT     SHOULDER ARTHROSCOPY WITH ROTATOR CUFF REPAIR AND SUBACROMIAL DECOMPRESSION Left 11/25/2017   Procedure: SHOULDER ARTHROSCOPic release of long head biceps tendon and mini open rotator cuff repair;  Surgeon: Leanor Kail, MD;  Location: [facility] ORS;  Service: Orthopedics;  Laterality: Left;   TONSILECTOMY, ADENOIDECTOMY, BILATERAL MYRINGOTOMY AND TUBES  TONSILLECTOMY     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   Procedure: TOTAL HIP ARTHROPLASTY ANTERIOR APPROACH;  Surgeon: Hessie Knows, MD;  Location: [facility] ORS;  Service: Orthopedics;  Laterality: Right;   TOTAL HIP ARTHROPLASTY Right 08/30/2020   Procedure: Right hip heterotopic ossification removal;  Surgeon: Hessie Knows, MD;  Location: [facility] ORS;  Service: Orthopedics;  Laterality: Right;   TUBAL LIGATION      Family History  Problem Relation Age of Onset   Diabetes Mother    Hypertension Mother    Heart disease Mother    Hyperlipidemia Mother    Heart disease  Father    Diabetes Sister    Heart disease Sister    Hypertension Sister    Hyperlipidemia Sister    Breast cancer Sister 74   Cancer Sister 5       breast   Breast cancer Maternal Aunt    Diabetes Sister    Stroke Sister    Diabetes Sister    Diabetes Sister     Social History   Tobacco Use   Smoking status: Former    Years: 10.00    Types: Cigarettes    Start date: 03/03/1958    Quit date: 1980    Years since quitting: 44.1   Smokeless tobacco: Never  Substance Use Topics   Alcohol use: Yes    Alcohol/week: 0.0 standard drinks of alcohol    Comment: socially     Current Outpatient Medications:    aspirin EC 81 MG tablet, Take 1 tablet (81 mg total) by mouth every evening. Swallow whole., Disp: 90 tablet, Rfl: 3   clindamycin (CLEOCIN) 150 MG capsule, Take 150 mg by mouth as needed (Take 4 caps 1 hour prior to appt.)., Disp: , Rfl:    olmesartan-hydrochlorothiazide (BENICAR HCT) 40-25 MG tablet, TAKE 1 TABLET BY MOUTH DAILY, Disp: 90 tablet, Rfl: 0   Polyethyl Glycol-Propyl Glycol (SYSTANE) 0.4-0.3 % SOLN, Place 1 drop into both eyes at bedtime., Disp: , Rfl:    rosuvastatin (CRESTOR) 20 MG tablet, Take 1 tablet (20 mg total) by mouth daily., Disp: 90 tablet, Rfl: 3  Allergies  Allergen Reactions   Tramadol Nausea And Vomiting   Doxycycline Itching   Penicillins Hives and Rash    leg lesions and weakness (couldn't walk)  Did it involve swelling of the face/tongue/throat, SOB, or low BP? No Did it involve sudden or severe rash/hives, skin peeling, or any reaction on the inside of your mouth or nose? No Did you need to seek medical attention at a hospital or doctor's office? Yes When did it last happen?      38 years If all above answers are "NO", may proceed with cephalosporin use.    I personally reviewed active problem list, medication list, allergies, family history, social history, health maintenance with the patient/caregiver today.   ROS  Constitutional:  Negative for fever , positive for mild weight change.  Respiratory: Negative for cough and shortness of breath.   Cardiovascular: Negative for chest pain or palpitations.  Gastrointestinal: Negative for abdominal pain, no bowel changes.  Musculoskeletal: positive  for gait problem and intermittent  joint swelling.  Skin: positive  for rash.  Neurological: Negative for dizziness or headache.  No other specific complaints in a complete review of systems (except as listed in HPI above).   Objective  Vitals:   04/10/22 1005  BP: 122/70  Pulse: 79  Resp: 16  Temp: 97.9 F (36.6 C)  TempSrc: Oral  SpO2:  98%  Weight: 210 lb 14.4 oz (95.7 kg)  Height: 5' 3"$  (1.6 m)    Body mass index is 37.36 kg/m.  Physical Exam  Constitutional: Patient appears well-developed and well-nourished. Obese  No distress.  HEENT: head atraumatic, normocephalic, pupils equal and reactive to light, ears normal TM, neck supple. Cardiovascular: Normal rate, regular rhythm and normal heart sounds.  No murmur heard. No BLE edema. Pulmonary/Chest: Effort normal and breath sounds normal. No respiratory distress. Abdominal: Soft.  There is no tenderness. Psychiatric: Patient has a normal mood and affect. behavior is normal. Judgment and thought content normal.   PHQ2/9:    04/10/2022   10:07 AM 03/25/2022    8:48 AM 11/06/2021   11:50 AM 10/10/2021    8:01 AM 04/05/2021   10:46 AM  Depression screen PHQ 2/9  Decreased Interest 0 0 0 0 0  Down, Depressed, Hopeless 0 0 0 0 0  PHQ - 2 Score 0 0 0 0 0  Altered sleeping 0 0  0 0  Tired, decreased energy 0 0  0 0  Change in appetite 0 0  0 0  Feeling bad or failure about yourself  0 0  0 0  Trouble concentrating 0 0  0 0  Moving slowly or fidgety/restless 0 0  0 0  Suicidal thoughts 0 0  0 0  PHQ-9 Score 0 0  0 0  Difficult doing work/chores  Not difficult at all   Not difficult at all    phq 9 is negative   Fall Risk:    04/10/2022   10:06 AM 03/25/2022     8:48 AM 10/10/2021    8:01 AM 04/05/2021   10:46 AM 03/21/2021    9:28 AM  Fall Risk   Falls in the past year? 1 0 0 0 0  Number falls in past yr:  0 0 0 0  Injury with Fall?  0 0 0 0  Risk for fall due to : History of fall(s) No Fall Risks No Fall Risks No Fall Risks No Fall Risks  Follow up Falls prevention discussed;Education provided;Falls evaluation completed Falls prevention discussed;Education provided;Falls evaluation completed Falls prevention discussed Falls prevention discussed Falls prevention discussed      Assessment & Plan  1. Benign essential HTN  - olmesartan-hydrochlorothiazide (BENICAR HCT) 40-25 MG tablet; Take 1 tablet by mouth daily.  Dispense: 90 tablet; Refill: 1 - CBC with Differential/Platelet - COMPLETE METABOLIC PANEL WITH GFR  2. Rash in adult  - clotrimazole-betamethasone (LOTRISONE) cream; Apply 1 Application topically daily.  Dispense: 45 g; Refill: 0  3. Morbid obesity ([place])  Discussed with the patient the risk posed by an increased BMI. Discussed importance of portion control, calorie counting and at least 150 minutes of physical activity weekly. Avoid sweet beverages and drink more water. Eat at least 6 servings of fruit and vegetables daily    4. OSA on CPAP  Compliant   5. Primary osteoarthritis of both knees  Seeing Ortho   6. Status post total hip replacement, right   7. Dyslipidemia  - Lipid panel  8. Other eczema  - mometasone (ELOCON) 0.1 % lotion; Apply 1 each topically daily. For ear pruritus  Dispense: 60 mL; Refill: 0  9. Vitamin D deficiency  - VITAMIN D 25 Hydroxy (Vit-D Deficiency, Fractures)  10. RLS (restless legs syndrome)  - rOPINIRole (REQUIP) 0.5 MG tablet; Take 1 tablet (0.5 mg total) by mouth at bedtime.  Dispense: 90 tablet; Refill: 0 -  CBC with Differential/Platelet

## 2022-04-10 ENCOUNTER — Ambulatory Visit (INDEPENDENT_AMBULATORY_CARE_PROVIDER_SITE_OTHER): Payer: Medicare Other | Admitting: Family Medicine

## 2022-04-10 ENCOUNTER — Encounter: Payer: Self-pay | Admitting: Family Medicine

## 2022-04-10 DIAGNOSIS — Z96641 Presence of right artificial hip joint: Secondary | ICD-10-CM | POA: Diagnosis not present

## 2022-04-10 DIAGNOSIS — G2581 Restless legs syndrome: Secondary | ICD-10-CM | POA: Diagnosis not present

## 2022-04-10 DIAGNOSIS — E785 Hyperlipidemia, unspecified: Secondary | ICD-10-CM | POA: Diagnosis not present

## 2022-04-10 DIAGNOSIS — G4733 Obstructive sleep apnea (adult) (pediatric): Secondary | ICD-10-CM

## 2022-04-10 DIAGNOSIS — R21 Rash and other nonspecific skin eruption: Secondary | ICD-10-CM

## 2022-04-10 DIAGNOSIS — M170 Bilateral primary osteoarthritis of knee: Secondary | ICD-10-CM

## 2022-04-10 DIAGNOSIS — I10 Essential (primary) hypertension: Secondary | ICD-10-CM | POA: Diagnosis not present

## 2022-04-10 DIAGNOSIS — L308 Other specified dermatitis: Secondary | ICD-10-CM

## 2022-04-10 DIAGNOSIS — E559 Vitamin D deficiency, unspecified: Secondary | ICD-10-CM

## 2022-04-10 DIAGNOSIS — R252 Cramp and spasm: Secondary | ICD-10-CM

## 2022-04-10 MED ORDER — CLOTRIMAZOLE-BETAMETHASONE 1-0.05 % EX CREA: 1.0000 | TOPICAL_CREAM | Freq: Every day | CUTANEOUS | 0 refills | Status: DC

## 2022-04-10 MED ORDER — MOMETASONE FUROATE 0.1 % EX SOLN: 1.0000 | Freq: Every day | CUTANEOUS | 0 refills | Status: AC

## 2022-04-10 MED ORDER — ROPINIROLE HCL 0.5 MG PO TABS: 0.5000 mg | ORAL_TABLET | Freq: Every day | ORAL | 0 refills | Status: DC

## 2022-04-10 MED ORDER — OLMESARTAN MEDOXOMIL-HCTZ 40-25 MG PO TABS: 1.0000 | ORAL_TABLET | Freq: Every day | ORAL | 1 refills | Status: DC

## 2022-04-11 LAB — COMPLETE METABOLIC PANEL WITH GFR
AG Ratio: 1.4 (calc) (ref 1.0–2.5)
ALT: 22 U/L (ref 6–29)
AST: 28 U/L (ref 10–35)
Albumin: 4.6 g/dL (ref 3.6–5.1)
Alkaline phosphatase (APISO): 65 U/L (ref 37–153)
BUN: 14 mg/dL (ref 7–25)
CO2: 32 mmol/L (ref 20–32)
Calcium: 10.4 mg/dL (ref 8.6–10.4)
Chloride: 101 mmol/L (ref 98–110)
Creat: 0.95 mg/dL (ref 0.60–1.00)
Globulin: 3.3 g/dL (calc) (ref 1.9–3.7)
Glucose, Bld: 139 mg/dL — ABNORMAL HIGH (ref 65–99)
Potassium: 4.1 mmol/L (ref 3.5–5.3)
Sodium: 144 mmol/L (ref 135–146)
Total Bilirubin: 0.8 mg/dL (ref 0.2–1.2)
Total Protein: 7.9 g/dL (ref 6.1–8.1)
eGFR: 62 mL/min/{1.73_m2} (ref >=60)

## 2022-04-11 LAB — CBC WITH DIFFERENTIAL/PLATELET
Absolute Monocytes: 578 cells/uL (ref 200–950)
Basophils Absolute: 28 cells/uL (ref 0–200)
Basophils Relative: 0.5 %
Eosinophils Absolute: 22 cells/uL (ref 15–500)
Eosinophils Relative: 0.4 %
HCT: 44.6 % (ref 35.0–45.0)
Hemoglobin: 14.9 g/dL (ref 11.7–15.5)
Lymphs Abs: 2167 cells/uL (ref 850–3900)
MCH: 28.6 pg (ref 27.0–33.0)
MCHC: 33.4 g/dL (ref 32.0–36.0)
MCV: 85.6 fL (ref 80.0–100.0)
MPV: 11.1 fL (ref 7.5–12.5)
Monocytes Relative: 10.5 %
Neutro Abs: 2706 cells/uL (ref 1500–7800)
Neutrophils Relative %: 49.2 %
Platelets: 355 10*3/uL (ref 140–400)
RBC: 5.21 10*6/uL — ABNORMAL HIGH (ref 3.80–5.10)
RDW: 14.8 % (ref 11.0–15.0)
Total Lymphocyte: 39.4 %
WBC: 5.5 10*3/uL (ref 3.8–10.8)

## 2022-04-11 LAB — VITAMIN D 25 HYDROXY (VIT D DEFICIENCY, FRACTURES): Vit D, 25-Hydroxy: 68 ng/mL (ref 30–100)

## 2022-04-11 LAB — LIPID PANEL
Cholesterol: 185 mg/dL (ref <200)
HDL: 82 mg/dL (ref >=50)
LDL Cholesterol (Calc): 87 mg/dL (calc)
Non-HDL Cholesterol (Calc): 103 mg/dL (calc) (ref <130)
Total CHOL/HDL Ratio: 2.3 (calc) (ref <5.0)
Triglycerides: 74 mg/dL (ref <150)

## 2022-04-18 DIAGNOSIS — M25561 Pain in right knee: Secondary | ICD-10-CM | POA: Diagnosis not present

## 2022-05-07 DIAGNOSIS — H35039 Hypertensive retinopathy, unspecified eye: Secondary | ICD-10-CM | POA: Diagnosis not present

## 2022-05-07 DIAGNOSIS — M3501 Sicca syndrome with keratoconjunctivitis: Secondary | ICD-10-CM | POA: Diagnosis not present

## 2022-05-15 DIAGNOSIS — G4733 Obstructive sleep apnea (adult) (pediatric): Secondary | ICD-10-CM | POA: Diagnosis not present

## 2022-05-20 ENCOUNTER — Ambulatory Visit: Payer: Self-pay | Admitting: *Deleted

## 2022-05-20 NOTE — Patient Outreach (Signed)
  Care Coordination   Follow Up Visit Note   05/20/2022 Name: Lisa Steele MRN: [id number] DOB: August 28, 1945  Lisa Steele is a 77 y.o. year old female who sees Steele Sizer, MD for primary care. I spoke with  Darrin Nipper by phone today.  What matters to the patients health and wellness today?  Relief of knee pain in order to refrain from having knee replacement.     Goals Addressed             This Visit's Progress    RNCM: Effective Management of Pain   On track    Care Coordination Interventions: Reviewed provider established plan for pain management. Patient is seeing the specialist, had injection in knee, pain is slightly better Discussed importance of adherence to all scheduled medical appointments. Review and education provided. Follow up with ortho on 3/21 Counseled on the importance of reporting any/all new or changed pain symptoms or management strategies to pain management provider Advised patient to report to care team affect of pain on daily activities Discussed use of relaxation techniques and/or diversional activities to assist with pain reduction (distraction, imagery, relaxation, massage, acupressure, TENS, heat, and cold application Reviewed with patient prescribed pharmacological and nonpharmacological pain relief strategies Advised patient to discuss unresolved pain, or changes in level or intensity of pain with provider The patient agrees to work with the [facility] and agrees to regular outreaches. Review of the care coordination program and how the program works. The patient knows how to get in touch with the [facility]        SDOH assessments and interventions completed:  No     Care Coordination Interventions:  Yes, provided   Interventions Today    Flowsheet Row Most Recent Value  Chronic Disease   Chronic disease during today's visit Other  [Pain management]  General Interventions   General Interventions Discussed/Reviewed Doctor Visits, General  Interventions Reviewed  Doctor Visits Discussed/Reviewed Doctor Visits Reviewed, Specialist  PCP/Specialist Visits Compliance with follow-up visit        Follow up plan: Follow up call scheduled for 4/8    Encounter Outcome:  Pt. Visit Completed   Valente David, RN, MSN, [facility] Care Management Coordinator [phone]

## 2022-05-22 DIAGNOSIS — M170 Bilateral primary osteoarthritis of knee: Secondary | ICD-10-CM | POA: Diagnosis not present

## 2022-05-22 DIAGNOSIS — M48061 Spinal stenosis, lumbar region without neurogenic claudication: Secondary | ICD-10-CM | POA: Diagnosis not present

## 2022-05-22 DIAGNOSIS — M25561 Pain in right knee: Secondary | ICD-10-CM | POA: Diagnosis not present

## 2022-05-22 DIAGNOSIS — M47896 Other spondylosis, lumbar region: Secondary | ICD-10-CM | POA: Diagnosis not present

## 2022-05-22 DIAGNOSIS — M25551 Pain in right hip: Secondary | ICD-10-CM | POA: Diagnosis not present

## 2022-05-22 DIAGNOSIS — M5136 Other intervertebral disc degeneration, lumbar region: Secondary | ICD-10-CM | POA: Diagnosis not present

## 2022-05-22 DIAGNOSIS — M545 Low back pain, unspecified: Secondary | ICD-10-CM | POA: Diagnosis not present

## 2022-05-22 DIAGNOSIS — M533 Sacrococcygeal disorders, not elsewhere classified: Secondary | ICD-10-CM | POA: Diagnosis not present

## 2022-05-22 DIAGNOSIS — M5416 Radiculopathy, lumbar region: Secondary | ICD-10-CM | POA: Diagnosis not present

## 2022-05-22 DIAGNOSIS — M25562 Pain in left knee: Secondary | ICD-10-CM | POA: Diagnosis not present

## 2022-05-28 DIAGNOSIS — M533 Sacrococcygeal disorders, not elsewhere classified: Secondary | ICD-10-CM | POA: Diagnosis not present

## 2022-06-09 ENCOUNTER — Ambulatory Visit: Payer: Self-pay | Admitting: *Deleted

## 2022-06-09 NOTE — Patient Outreach (Signed)
  Care Coordination   Follow Up Visit Note   06/09/2022 Name: Lisa Steele MRN: [id number] DOB: December 17, 1945  Lisa Steele Care is a 77 y.o. year old female who sees [name], MD for primary care. I spoke with  [name] by phone today.  What matters to the patients health and wellness today?  Has been doing better with pain control since injection in hip, will continue to monitor and manage to decrease chances of needing surgery.     Goals Addressed             This Visit's Progress    RNCM: Effective Management of Pain   On track    Care Coordination Interventions: Reviewed provider established plan for pain management. Patient is seeing the specialist, had injection in knee, pain is slightly better Discussed importance of adherence to all scheduled medical appointments. Review and education provided. Follow up with ortho on 5/23, cardiology on 8/7, PCP on 8/9, and pulmonary on 10/14. Counseled on the importance of reporting any/all new or changed pain symptoms or management strategies to pain management provider Advised patient to report to care team affect of pain on daily activities Discussed use of relaxation techniques and/or diversional activities to assist with pain reduction (distraction, imagery, relaxation, massage, acupressure, TENS, heat, and cold application Reviewed with patient prescribed pharmacological and nonpharmacological pain relief strategies Advised patient to discuss unresolved pain, or changes in level or intensity of pain with provider The patient agrees to work with the [facility] and agrees to regular outreaches. Review of the care coordination program and how the program works. The patient knows how to get in touch with the [facility] Discussed including vitamins in daily regime.  She will discuss with pharmacy to confirm there will be no interactions        SDOH assessments and interventions completed:  No     Care Coordination Interventions:  Yes,  provided   Follow up plan: Follow up call scheduled for 5/29    Encounter Outcome:  Pt. Visit Completed   Kemper Durie, RN, MSN, [facility] Care Management Care Management Coordinator [phone]

## 2022-06-16 ENCOUNTER — Other Ambulatory Visit: Payer: Self-pay | Admitting: Family Medicine

## 2022-06-16 DIAGNOSIS — G2581 Restless legs syndrome: Secondary | ICD-10-CM

## 2022-06-26 ENCOUNTER — Encounter: Payer: Self-pay | Admitting: Family Medicine

## 2022-07-09 ENCOUNTER — Encounter: Payer: Medicare Other | Admitting: Family Medicine

## 2022-07-24 DIAGNOSIS — M25562 Pain in left knee: Secondary | ICD-10-CM | POA: Diagnosis not present

## 2022-07-24 DIAGNOSIS — M545 Low back pain, unspecified: Secondary | ICD-10-CM | POA: Diagnosis not present

## 2022-07-24 DIAGNOSIS — M47896 Other spondylosis, lumbar region: Secondary | ICD-10-CM | POA: Diagnosis not present

## 2022-07-24 DIAGNOSIS — M5136 Other intervertebral disc degeneration, lumbar region: Secondary | ICD-10-CM | POA: Diagnosis not present

## 2022-07-24 DIAGNOSIS — M170 Bilateral primary osteoarthritis of knee: Secondary | ICD-10-CM | POA: Diagnosis not present

## 2022-07-24 DIAGNOSIS — M533 Sacrococcygeal disorders, not elsewhere classified: Secondary | ICD-10-CM | POA: Diagnosis not present

## 2022-07-24 DIAGNOSIS — M48061 Spinal stenosis, lumbar region without neurogenic claudication: Secondary | ICD-10-CM | POA: Diagnosis not present

## 2022-07-24 DIAGNOSIS — M25551 Pain in right hip: Secondary | ICD-10-CM | POA: Diagnosis not present

## 2022-07-24 DIAGNOSIS — M25561 Pain in right knee: Secondary | ICD-10-CM | POA: Diagnosis not present

## 2022-07-24 DIAGNOSIS — M5416 Radiculopathy, lumbar region: Secondary | ICD-10-CM | POA: Diagnosis not present

## 2022-07-30 ENCOUNTER — Ambulatory Visit: Payer: Self-pay | Admitting: *Deleted

## 2022-07-30 NOTE — Patient Outreach (Signed)
  Care Coordination   Follow Up Visit Note   07/30/2022 Name: Lisa Steele MRN: [id number] DOB: 11/03/1945  Lisa Steele is a 77 y.o. year old female who sees Lisa Cory, MD for primary care. I spoke with  Lisa Steele by phone today.  What matters to the patients health and wellness today? Relief of knee pain and keeping hip pain controlled     Goals Addressed             This Visit's Progress    RNCM: Effective Management of Pain   On track    Care Coordination Interventions: Reviewed provider established plan for pain management. Discussed importance of adherence to all scheduled medical appointments. Counseled on the importance of reporting any/all new or changed pain symptoms or management strategies to pain management provider Advised patient to report to care team affect of pain on daily activities Discussed use of relaxation techniques and/or diversional activities to assist with pain reduction (distraction, imagery, relaxation, massage, acupressure, TENS, heat, and cold application Reviewed with patient prescribed pharmacological and nonpharmacological pain relief strategies Advised patient to discuss unresolved pain, or changes in level or intensity of pain with provider The patient agrees to work with the [facility] and agrees to regular outreaches. Review of the care coordination program and how the program works. The patient knows how to get in touch with the [facility]        SDOH assessments and interventions completed:  No     Care Coordination Interventions:  Yes, provided   Interventions Today    Flowsheet Row Most Recent Value  Chronic Disease   Chronic disease during today's visit Other  [pain management]  General Interventions   General Interventions Discussed/Reviewed General Interventions Reviewed, Doctor Visits  Doctor Visits Discussed/Reviewed Doctor Visits Reviewed, Specialist  [Appointment with knee specialist on 6/12, will receive injection in left  hip at that time as well.  She is hoping not to have a knee replacement, but gel injections have not worked for knee pain.  Injections has worked for hip pain.]  PCP/Specialist Visits Compliance with follow-up visit  Exercise Interventions   Exercise Discussed/Reviewed Physical Activity  Physical Activity Discussed/Reviewed Physical Activity Reviewed       Follow up plan: Follow up call scheduled for 6/17    Encounter Outcome:  Pt. Visit Completed   Kemper Durie, RN, MSN, [facility] Care Management Care Management Coordinator [phone]

## 2022-08-07 ENCOUNTER — Other Ambulatory Visit: Payer: Self-pay | Admitting: Obstetrics and Gynecology

## 2022-08-07 DIAGNOSIS — Z1231 Encounter for screening mammogram for malignant neoplasm of breast: Secondary | ICD-10-CM

## 2022-08-07 DIAGNOSIS — Z01419 Encounter for gynecological examination (general) (routine) without abnormal findings: Secondary | ICD-10-CM | POA: Diagnosis not present

## 2022-08-13 DIAGNOSIS — M533 Sacrococcygeal disorders, not elsewhere classified: Secondary | ICD-10-CM | POA: Diagnosis not present

## 2022-08-13 DIAGNOSIS — M170 Bilateral primary osteoarthritis of knee: Secondary | ICD-10-CM | POA: Diagnosis not present

## 2022-08-15 ENCOUNTER — Other Ambulatory Visit: Payer: Self-pay | Admitting: Family Medicine

## 2022-08-15 DIAGNOSIS — E785 Hyperlipidemia, unspecified: Secondary | ICD-10-CM

## 2022-08-18 ENCOUNTER — Ambulatory Visit: Payer: Self-pay | Admitting: *Deleted

## 2022-08-18 NOTE — Patient Outreach (Signed)
  Care Coordination   Follow Up Visit Note   08/19/2022 Name: Lisa Steele MRN: [id number] DOB: 1945-04-29  Lisa Steele is a 77 y.o. year old female who sees [name], MD for primary care. I spoke with  [name] by phone today.  What matters to the patients health and wellness today?  Obtain knee brace, not have knee surgery    Goals Addressed             This Visit's Progress    RNCM: Effective Management of Pain   On track    Care Coordination Interventions: Reviewed provider established plan for pain management. Discussed importance of adherence to all scheduled medical appointments. Counseled on the importance of reporting any/all new or changed pain symptoms or management strategies to pain management provider Advised patient to report to care team affect of pain on daily activities Discussed use of relaxation techniques and/or diversional activities to assist with pain reduction (distraction, imagery, relaxation, massage, acupressure, TENS, heat, and cold application Reviewed with patient prescribed pharmacological and nonpharmacological pain relief strategies Advised patient to discuss unresolved pain, or changes in level or intensity of pain with provider The patient agrees to work with the [facility] and agrees to regular outreaches. Review of the care coordination program and how the program works. The patient knows how to get in touch with the [facility]        SDOH assessments and interventions completed:  No     Care Coordination Interventions:  Yes, provided   Follow up plan: Follow up call scheduled for 7/1    Encounter Outcome:  Pt. Visit Completed   Kemper Durie, RN, MSN, [facility] Care Management Care Management Coordinator [phone]

## 2022-09-01 ENCOUNTER — Ambulatory Visit: Payer: Self-pay | Admitting: *Deleted

## 2022-09-01 NOTE — Patient Outreach (Signed)
  Care Coordination   Follow Up Visit Note   09/01/2022 Name: Lisa Steele MRN: [id number] DOB: 24-Nov-1945  Lisa Steele is a 77 y.o. year old female who sees Lisa Cory, MD for primary care. I spoke with  Lisa Steele by phone today.  What matters to the patients health and wellness today?  Obtain knee brace    Goals Addressed             This Visit's Progress    RNCM: Effective Management of Pain   On track    Care Coordination Interventions: Reviewed provider established plan for pain management. Discussed importance of adherence to all scheduled medical appointments. Counseled on the importance of reporting any/all new or changed pain symptoms or management strategies to pain management provider Advised patient to report to care team affect of pain on daily activities Discussed use of relaxation techniques and/or diversional activities to assist with pain reduction (distraction, imagery, relaxation, massage, acupressure, TENS, heat, and cold application Reviewed with patient prescribed pharmacological and nonpharmacological pain relief strategies Advised patient to discuss unresolved pain, or changes in level or intensity of pain with provider The patient agrees to work with the [facility] and agrees to regular outreaches. Review of the care coordination program and how the program works. The patient knows how to get in touch with the [facility]        SDOH assessments and interventions completed:  No     Care Coordination Interventions:  Yes, provided   Interventions Today    Flowsheet Row Most Recent Value  Chronic Disease   Chronic disease during today's visit Other  [pain]  General Interventions   General Interventions Discussed/Reviewed General Interventions Reviewed, Doctor Visits, Durable Medical Equipment (DME)  Doctor Visits Discussed/Reviewed Doctor Visits Reviewed, PCP, Specialist  Lisa Steele 7/19, ortho 7/25, cardiology 8/7, PCP 8/9]  Durable Medical Equipment  (DME) Other  [knee brace, has not heard from DME company or ortho, she will call to follow up]  PCP/Specialist Visits Compliance with follow-up visit       Follow up plan: Follow up call scheduled for 8/12    Encounter Outcome:  Pt. Visit Completed   Lisa Durie, RN, MSN, [facility] Care Management Care Management Coordinator [phone]

## 2022-09-19 ENCOUNTER — Ambulatory Visit
Admission: RE | Admit: 2022-09-19 | Discharge: 2022-09-19 | Disposition: A | Discharge status: Home / Self Care | Payer: Medicare Other | Source: Ambulatory Visit | Attending: Obstetrics and Gynecology | Admitting: Obstetrics and Gynecology

## 2022-09-19 DIAGNOSIS — Z1231 Encounter for screening mammogram for malignant neoplasm of breast: Secondary | ICD-10-CM | POA: Insufficient documentation

## 2022-09-25 DIAGNOSIS — M25562 Pain in left knee: Secondary | ICD-10-CM | POA: Diagnosis not present

## 2022-09-25 DIAGNOSIS — M5416 Radiculopathy, lumbar region: Secondary | ICD-10-CM | POA: Diagnosis not present

## 2022-09-25 DIAGNOSIS — M25561 Pain in right knee: Secondary | ICD-10-CM | POA: Diagnosis not present

## 2022-09-25 DIAGNOSIS — M5136 Other intervertebral disc degeneration, lumbar region: Secondary | ICD-10-CM | POA: Diagnosis not present

## 2022-09-25 DIAGNOSIS — M48061 Spinal stenosis, lumbar region without neurogenic claudication: Secondary | ICD-10-CM | POA: Diagnosis not present

## 2022-09-25 DIAGNOSIS — M25551 Pain in right hip: Secondary | ICD-10-CM | POA: Diagnosis not present

## 2022-09-25 DIAGNOSIS — M533 Sacrococcygeal disorders, not elsewhere classified: Secondary | ICD-10-CM | POA: Diagnosis not present

## 2022-09-25 DIAGNOSIS — M7061 Trochanteric bursitis, right hip: Secondary | ICD-10-CM | POA: Diagnosis not present

## 2022-09-25 DIAGNOSIS — M170 Bilateral primary osteoarthritis of knee: Secondary | ICD-10-CM | POA: Diagnosis not present

## 2022-09-25 DIAGNOSIS — M47896 Other spondylosis, lumbar region: Secondary | ICD-10-CM | POA: Diagnosis not present

## 2022-09-25 DIAGNOSIS — M545 Low back pain, unspecified: Secondary | ICD-10-CM | POA: Diagnosis not present

## 2022-10-02 ENCOUNTER — Telehealth: Payer: Self-pay | Admitting: *Deleted

## 2022-10-02 ENCOUNTER — Telehealth: Payer: Medicare Other | Admitting: Physician Assistant

## 2022-10-02 ENCOUNTER — Ambulatory Visit: Payer: Self-pay | Admitting: *Deleted

## 2022-10-02 ENCOUNTER — Telehealth: Payer: Self-pay | Admitting: Family Medicine

## 2022-10-02 VITALS — BP 142/80

## 2022-10-02 DIAGNOSIS — U071 COVID-19: Secondary | ICD-10-CM

## 2022-10-02 MED ORDER — BENZONATATE 100 MG PO CAPS: 100.0000 mg | ORAL_CAPSULE | Freq: Three times a day (TID) | ORAL | 0 refills | Status: DC | PRN

## 2022-10-02 MED ORDER — NIRMATRELVIR/RITONAVIR (PAXLOVID)TABLET: 3.0000 | ORAL_TABLET | Freq: Two times a day (BID) | ORAL | 0 refills | Status: AC

## 2022-10-02 NOTE — Patient Instructions (Signed)
Lisa Steele, thank you for joining Piedad Climes, PA-C for today's virtual visit.  While this provider is not your primary care provider (PCP), if your PCP is located in our provider database this encounter information will be shared with them immediately following your visit.   A [facility] MyChart account gives you access to today's visit and all your visits, tests, and labs performed at [facility] " click here if you don't have a [facility] MyChart account or go to [email or web address]  Consent: (Patient) Lisa Steele provided verbal consent for this virtual visit at the beginning of the encounter.  Current Medications:  Current Outpatient Medications:    aspirin EC 81 MG tablet, Take 1 tablet (81 mg total) by mouth every evening. Swallow whole., Disp: 90 tablet, Rfl: 3   clindamycin (CLEOCIN) 150 MG capsule, Take 150 mg by mouth as needed (Take 4 caps 1 hour prior to appt.)., Disp: , Rfl:    clotrimazole-betamethasone (LOTRISONE) cream, Apply 1 Application topically daily., Disp: 45 g, Rfl: 0   mometasone (ELOCON) 0.1 % lotion, Apply 1 each topically daily. For ear pruritus, Disp: 60 mL, Rfl: 0   olmesartan-hydrochlorothiazide (BENICAR HCT) 40-25 MG tablet, Take 1 tablet by mouth daily., Disp: 90 tablet, Rfl: 1   Polyethyl Glycol-Propyl Glycol (SYSTANE) 0.4-0.3 % SOLN, Place 1 drop into both eyes at bedtime., Disp: , Rfl:    rosuvastatin (CRESTOR) 20 MG tablet, TAKE 1 TABLET BY MOUTH DAILY, Disp: 90 tablet, Rfl: 0   Medications ordered in this encounter:  No orders of the defined types were placed in this encounter.    *If you need refills on other medications prior to your next appointment, please contact your pharmacy*  Follow-Up: Call back or seek an in-person evaluation if the symptoms worsen or if the condition fails to improve as anticipated.  [facility] [phone]  Care Instructions: Please keep well-hydrated and get plenty of  rest. Start a saline nasal rinse to flush out your nasal passages. You can use plain Mucinex to help thin congestion. If you have a humidifier, running in the bedroom at night. I want you to start OTC vitamin D3 1000 units daily, vitamin C 1000 mg daily, and a zinc supplement. Please take prescribed medications as directed.  Isolation Instructions: You are to isolate at home until you have been fever free for at least 24 hours without a fever-reducing medication, and symptoms have been steadily improving for 24 hours. At that time,  you can end isolation but need to mask for an additional 5 days.   If you must be around other household members who do not have symptoms, you need to make sure that both you and the family members are masking consistently with a high-quality mask.  If you note any worsening of symptoms despite treatment, please seek an in-person evaluation ASAP. If you note any significant shortness of breath or any chest pain, please seek ER evaluation. Please do not delay care!   COVID-19: What to Do if You Are Sick If you test positive and are an older adult or someone who is at high risk of getting very sick from COVID-19, treatment may be available. Contact a healthcare provider right away after a positive test to determine if you are eligible, even if your symptoms are mild right now. You can also visit a Test to Treat location and, if eligible, receive a prescription from a provider. Don't delay: Treatment must be started within the first few  days to be effective. If you have a fever, cough, or other symptoms, you might have COVID-19. Most people have mild illness and are able to recover at home. If you are sick: Keep track of your symptoms. If you have an emergency warning sign (including trouble breathing), call 911. Steps to help prevent the spread of COVID-19 if you are sick If you are sick with COVID-19 or think you might have COVID-19, follow the steps below to care for  yourself and to help protect other people in your home and community. Stay home except to get medical care Stay home. Most people with COVID-19 have mild illness and can recover at home without medical care. Do not leave your home, except to get medical care. Do not visit public areas and do not go to places where you are unable to wear a mask. Take care of yourself. Get rest and stay hydrated. Take over-the-counter medicines, such as acetaminophen, to help you feel better. Stay in touch with your doctor. Call before you get medical care. Be sure to get care if you have trouble breathing, or have any other emergency warning signs, or if you think it is an emergency. Avoid public transportation, ride-sharing, or taxis if possible. Get tested If you have symptoms of COVID-19, get tested. While waiting for test results, stay away from others, including staying apart from those living in your household. Get tested as soon as possible after your symptoms start. Treatments may be available for people with COVID-19 who are at risk for becoming very sick. Don't delay: Treatment must be started early to be effective--some treatments must begin within 5 days of your first symptoms. Contact your healthcare provider right away if your test result is positive to determine if you are eligible. Self-tests are one of several options for testing for the virus that causes COVID-19 and may be more convenient than laboratory-based tests and point-of-care tests. Ask your healthcare provider or your local health department if you need help interpreting your test results. You can visit your state, tribal, local, and territorial health department's website to look for the latest local information on testing sites. Separate yourself from other people As much as possible, stay in a specific room and away from other people and pets in your home. If possible, you should use a separate bathroom. If you need to be around other people  or animals in or outside of the home, wear a well-fitting mask. Tell your close contacts that they may have been exposed to COVID-19. An infected person can spread COVID-19 starting 48 hours (or 2 days) before the person has any symptoms or tests positive. By letting your close contacts know they may have been exposed to COVID-19, you are helping to protect everyone. See COVID-19 and Animals if you have questions about pets. If you are diagnosed with COVID-19, someone from the health department may call you. Answer the call to slow the spread. Monitor your symptoms Symptoms of COVID-19 include fever, cough, or other symptoms. Follow care instructions from your healthcare provider and local health department. Your local health authorities may give instructions on checking your symptoms and reporting information. When to seek emergency medical attention Look for emergency warning signs* for COVID-19. If someone is showing any of these signs, seek emergency medical care immediately: Trouble breathing Persistent pain or pressure in the chest New confusion Inability to wake or stay awake Pale, gray, or blue-colored skin, lips, or nail beds, depending on skin tone *This list is  not all possible symptoms. Please call your medical provider for any other symptoms that are severe or concerning to you. Call 911 or call ahead to your local emergency facility: Notify the operator that you are seeking care for someone who has or may have COVID-19. Call ahead before visiting your doctor Call ahead. Many medical visits for routine care are being postponed or done by phone or telemedicine. If you have a medical appointment that cannot be postponed, call your doctor's office, and tell them you have or may have COVID-19. This will help the office protect themselves and other patients. If you are sick, wear a well-fitting mask You should wear a mask if you must be around other people or animals, including pets (even  at home). Wear a mask with the best fit, protection, and comfort for you. You don't need to wear the mask if you are alone. If you can't put on a mask (because of trouble breathing, for example), cover your coughs and sneezes in some other way. Try to stay at least 6 feet away from other people. This will help protect the people around you. Masks should not be placed on young children under age 48 years, anyone who has trouble breathing, or anyone who is not able to remove the mask without help. Cover your coughs and sneezes Cover your mouth and nose with a tissue when you cough or sneeze. Throw away used tissues in a lined trash can. Immediately wash your hands with soap and water for at least 20 seconds. If soap and water are not available, clean your hands with an alcohol-based hand sanitizer that contains at least 60% alcohol. Clean your hands often Wash your hands often with soap and water for at least 20 seconds. This is especially important after blowing your nose, coughing, or sneezing; going to the bathroom; and before eating or preparing food. Use hand sanitizer if soap and water are not available. Use an alcohol-based hand sanitizer with at least 60% alcohol, covering all surfaces of your hands and rubbing them together until they feel dry. Soap and water are the best option, especially if hands are visibly dirty. Avoid touching your eyes, nose, and mouth with unwashed hands. Handwashing Tips Avoid sharing personal household items Do not share dishes, drinking glasses, cups, eating utensils, towels, or bedding with other people in your home. Wash these items thoroughly after using them with soap and water or put in the dishwasher. Clean surfaces in your home regularly Clean and disinfect high-touch surfaces (for example, doorknobs, tables, handles, light switches, and countertops) in your "sick room" and bathroom. In shared spaces, you should clean and disinfect surfaces and items after  each use by the person who is ill. If you are sick and cannot clean, a caregiver or other person should only clean and disinfect the area around you (such as your bedroom and bathroom) on an as needed basis. Your caregiver/other person should wait as long as possible (at least several hours) and wear a mask before entering, cleaning, and disinfecting shared spaces that you use. Clean and disinfect areas that may have blood, stool, or body fluids on them. Use household cleaners and disinfectants. Clean visible dirty surfaces with household cleaners containing soap or detergent. Then, use a household disinfectant. Use a product from [employer] List N: Disinfectants for Coronavirus (COVID-19). Be sure to follow the instructions on the label to ensure safe and effective use of the product. Many products recommend keeping the surface wet with a disinfectant  for a certain period of time (look at "contact time" on the product label). You may also need to wear personal protective equipment, such as gloves, depending on the directions on the product label. Immediately after disinfecting, wash your hands with soap and water for 20 seconds. For completed guidance on cleaning and disinfecting your home, visit Complete Disinfection Guidance. Take steps to improve ventilation at home Improve ventilation (air flow) at home to help prevent from spreading COVID-19 to other people in your household. Clear out COVID-19 virus particles in the air by opening windows, using air filters, and turning on fans in your home. Use this interactive tool to learn how to improve air flow in your home. When you can be around others after being sick with COVID-19 Deciding when you can be around others is different for different situations. Find out when you can safely end home isolation. For any additional questions about your care, contact your healthcare provider or state or local health department. 05/22/2020 Content source: [facility] for Immunization and Respiratory Diseases ([facility]IRD), Division of Viral Diseases This information is not intended to replace advice given to you by your health care provider. Make sure you discuss any questions you have with your health care provider. Document Revised: 07/05/2020 Document Reviewed: 07/05/2020 Elsevier Patient Education  2022 [employer].   If you have been instructed to have an in-person evaluation today at a local Urgent Care facility, please use the link below. It will take you to a list of all of our available [facility], including address, phone number and hours of operation. Please do not delay care.  [facility]  If you or a family member do not have a primary care provider, use the link below to schedule a visit and establish care. When you choose a [facility] primary care physician or advanced practice provider, you gain a long-term partner in health. Find a Primary Care Provider  Learn more about [facility]'s in-office and virtual care options: [facility] - Get Care Now

## 2022-10-02 NOTE — Telephone Encounter (Signed)
Summary: Covid+   Lisa Steele with [facility] called to report that patient tested positive for covid and would like to get medication. Please advise.      Called patient [phone] to review covid positive status and sx. No answer, [facility] [phone].

## 2022-10-02 NOTE — Progress Notes (Signed)
Virtual Visit Consent   Lisa Steele, you are scheduled for a virtual visit with a [facility] provider today. Just as with appointments in the office, your consent must be obtained to participate. Your consent will be active for this visit and any virtual visit you may have with one of our providers in the next 365 days. If you have a MyChart account, a copy of this consent can be sent to you electronically.  As this is a virtual visit, video technology does not allow for your provider to perform a traditional examination. This may limit your provider's ability to fully assess your condition. If your provider identifies any concerns that need to be evaluated in person or the need to arrange testing (such as labs, EKG, etc.), we will make arrangements to do so. Although advances in technology are sophisticated, we cannot ensure that it will always work on either your end or our end. If the connection with a video visit is poor, the visit may have to be switched to a telephone visit. With either a video or telephone visit, we are not always able to ensure that we have a secure connection.  By engaging in this virtual visit, you consent to the provision of healthcare and authorize for your insurance to be billed (if applicable) for the services provided during this visit. Depending on your insurance coverage, you may receive a charge related to this service.  I need to obtain your verbal consent now. Are you willing to proceed with your visit today? [name] has provided verbal consent on 10/02/2022 for a virtual visit (video or telephone). Lisa Steele, [place]  Date: 10/02/2022 4:30 PM  Virtual Visit via Video Note   I, Lisa Steele, connected with  Lisa Steele  ([id number], 11-Aug-1945) on 10/02/22 at  4:30 PM EDT by a video-enabled telemedicine application and verified that I am speaking with the correct person using two identifiers.  Location: Patient: Virtual Visit Location  Patient: Home Provider: Virtual Visit Location Provider: Home Office   I discussed the limitations of evaluation and management by telemedicine and the availability of in person appointments. The patient expressed understanding and agreed to proceed.    History of Present Illness: Lisa Steele is a 77 y.o. who identifies as a female who was assigned female at birth, and is being seen today for COVID-19. Endorses scratchy throat Sunday night into Monday morning. Otherwise fine so drank some warm tea and started some Coricidin HBP. The next day started with nasal congestion and sinus pressure and headache. Took some Tylenol which helped with the HA. Since then noting chills. Took a COVID test last night which was positive. Denies chest pain or SOB.   HPI: HPI  Problems:  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   Status post total hip replacement, right 06/16/2019   Trigger finger of left thumb 03/04/2018   S/P rotator cuff repair 01/12/2018   Injury of left rotator cuff 11/03/2017   Primary osteoarthritis of both knees 06/22/2014   Dysmetabolic syndrome 06/22/2014   Post-menopausal 06/22/2014   H/O cold sores 06/22/2014   Dyslipidemia 06/22/2014   Elevated fasting blood sugar 06/22/2014   Benign essential HTN 06/22/2014   Abnormal ECG 06/22/2014    Allergies:  Allergies  Allergen Reactions   Tramadol Nausea And Vomiting   Doxycycline Itching   Penicillins Hives and Rash  leg lesions and weakness (couldn't walk)  Did it involve swelling of the face/tongue/throat, SOB, or low BP? No Did it involve sudden or severe rash/hives, skin peeling, or any reaction on the inside of your mouth or nose? No Did you need to seek medical attention at a hospital or doctor's office? Yes When did it last happen?      38 years If all above answers are "NO", may proceed with  cephalosporin use.   Medications:  Current Outpatient Medications:    aspirin EC 81 MG tablet, Take 1 tablet (81 mg total) by mouth every evening. Swallow whole., Disp: 90 tablet, Rfl: 3   clindamycin (CLEOCIN) 150 MG capsule, Take 150 mg by mouth as needed (Take 4 caps 1 hour prior to appt.)., Disp: , Rfl:    clotrimazole-betamethasone (LOTRISONE) cream, Apply 1 Application topically daily., Disp: 45 g, Rfl: 0   mometasone (ELOCON) 0.1 % lotion, Apply 1 each topically daily. For ear pruritus, Disp: 60 mL, Rfl: 0   olmesartan-hydrochlorothiazide (BENICAR HCT) 40-25 MG tablet, Take 1 tablet by mouth daily., Disp: 90 tablet, Rfl: 1   Polyethyl Glycol-Propyl Glycol (SYSTANE) 0.4-0.3 % SOLN, Place 1 drop into both eyes at bedtime., Disp: , Rfl:    rosuvastatin (CRESTOR) 20 MG tablet, TAKE 1 TABLET BY MOUTH DAILY, Disp: 90 tablet, Rfl: 0  Observations/Objective: Patient is well-developed, well-nourished in no acute distress.  Resting comfortably at home.  Head is normocephalic, atraumatic.  No labored breathing. Speech is clear and coherent with logical content.  Patient is alert and oriented at baseline.   Assessment and Plan: 1. COVID-19  Patient with multiple risk factors for complicated course of illness. Discussed risks/benefits of antiviral medications including most common potential ADRs. Patient voiced understanding and would like to proceed with antiviral medication. They are candidate for Paxlovid. Rx sent to pharmacy. Supportive measures, OTC medications and vitamin regimen reviewed. Tessalon per orders. Quarantine reviewed in detail. Strict ER precautions discussed with patient.    Follow Up Instructions: I discussed the assessment and treatment plan with the patient. The patient was provided an opportunity to ask questions and all were answered. The patient agreed with the plan and demonstrated an understanding of the instructions.  A copy of instructions were sent to the patient  via MyChart unless otherwise noted below.   The patient was advised to call back or seek an in-person evaluation if the symptoms worsen or if the condition fails to improve as anticipated.  Time:  I spent 10 minutes with the patient via telehealth technology discussing the above problems/concerns.    Lisa Climes, PA-C

## 2022-10-02 NOTE — Patient Instructions (Signed)
Visit Information  Thank you for taking time to visit with me today. Please don't hesitate to contact me if I can be of assistance to you before our next scheduled telephone appointment.  Following are the goals we discussed today:  Continue fluids Seek emergency medical attention if having shortness of breath  Our next appointment is by telephone on 8/12  Please call the care guide team at [phone] if you need to cancel or reschedule your appointment.   Please call the Suicide and Crisis Lifeline: 988 call the [place] National Suicide Prevention Lifeline: (574)027-8864 or TTY: (531)539-2530 TTY 8507278938) to talk to a trained counselor call 1-800-273-TALK (toll free, 24 hour hotline) call 911 if you are experiencing a Mental Health or Behavioral Health Crisis or need someone to talk to.  Patient verbalizes understanding of instructions and care plan provided today and agrees to view in MyChart. Active MyChart status and patient understanding of how to access instructions and care plan via MyChart confirmed with patient.     The patient has been provided with contact information for the care management team and has been advised to call with any health related questions or concerns.   Kemper Durie, RN, MSN, [facility] Care Management Care Management Coordinator [phone]

## 2022-10-02 NOTE — Telephone Encounter (Signed)
Chief Complaint: Covid positive Symptoms: Productive cough, fatigue, sore throat, chills, congestion Frequency: onset Sunday and progressive gotten worse Pertinent Negatives: Patient denies loss of taste and smell, fever, nausea, vomiting Disposition: [] ED /[] Urgent Care (no appt availability in office) / [] Appointment(In office/virtual)/ [x]  [facility]/ [] Home Care/ [] Refused Recommended Disposition /[] [facility]/ []  Follow-up with PCP Additional Notes: Patient states she tested positive for Covid-19 last night after having some symptoms that began Sunday evening. Patient stated that she attended a family reunion over the weekend and was notified that several attendees had also tested positive. Patient reports having a severe cough that is productive and keeping her up at night. Patient is apart of the high risk population. No appointments available in office until next week. Patient has been schedule for virtual Urgent Care visit today at 1630.   Reason for Disposition  [1] HIGH RISK patient AND [2] influenza is widespread in the community AND [3] ONE OR MORE respiratory symptoms: cough, sore throat, runny or stuffy nose  Answer Assessment - Initial Assessment Questions 1. COVID-19 DIAGNOSIS: "How do you know that you have COVID?" (e.g., positive lab test or self-test, diagnosed by doctor or NP/PA, symptoms after exposure).     Patient tested with a home kit and was positive last night  2. COVID-19 EXPOSURE: "Was there any known exposure to COVID before the symptoms began?" CDC Definition of close contact: within 6 feet (2 meters) for a total of 15 minutes or more over a 24-hour period.      No, not that I know of, I attended a family reunion over the weekend. 3. ONSET: "When did the COVID-19 symptoms start?"      Sunday night I had a sore throat.  4. WORST SYMPTOM: "What is your worst symptom?" (e.g., cough, fever, shortness of breath, muscle aches)     coughing 5.  COUGH: "Do you have a cough?" If Yes, ask: "How bad is the cough?"       Cough up a lot of phlegm  6. FEVER: "Do you have a fever?" If Yes, ask: "What is your temperature, how was it measured, and when did it start?"     No it was 97.1 2 hours ago across my forehead  7. RESPIRATORY STATUS: "Describe your breathing?" (e.g., normal; shortness of breath, wheezing, unable to speak)      Normal until I starting coughing  8. BETTER-SAME-WORSE: "Are you getting better, staying the same or getting worse compared to yesterday?"  If getting worse, ask, "In what way?"     A little better expect of the coughing  9. OTHER SYMPTOMS: "Do you have any other symptoms?"  (e.g., chills, fatigue, headache, loss of smell or taste, muscle pain, sore throat)     Headache, congestion, sore throat, chills  10. HIGH RISK DISEASE: "Do you have any chronic medical problems?" (e.g., asthma, heart or lung disease, weak immune system, obesity, etc.)       Obesity 11. VACCINE: "Have you had the COVID-19 vaccine?" If Yes, ask: "Which one, how many shots, when did you get it?"       Yes, I've more than 4 last one was in May 2024  Protocols used: Coronavirus (COVID-19) Diagnosed or Suspected-A-AH

## 2022-10-02 NOTE — Telephone Encounter (Signed)
Monica with [facility] is calling in requesting to speak with a nurse regarding this patient. Maxine Glenn can be reached at [phone] . Please follow up with [facility]

## 2022-10-02 NOTE — Telephone Encounter (Signed)
Left voicemail for return call for assistance.

## 2022-10-02 NOTE — Patient Outreach (Signed)
  Care Coordination   Follow Up Visit Note   10/02/2022 Name: Lisa Steele MRN: [id number] DOB: Sep 29, 1945  Lisa Steele is a 77 y.o. year old female who sees [name], MD for primary care. I spoke with  Lisa Steele by phone today.  What matters to the patients health and wellness today?  Get treatment for Covid    Goals Addressed             This Visit's Progress    Recover from Covid       Interventions Today    Flowsheet Row Most Recent Value  Chronic Disease   Chronic disease during today's visit Other  [covid]  General Interventions   General Interventions Discussed/Reviewed General Interventions Reviewed, Doctor Visits, Communication with  Doctor Visits Discussed/Reviewed Doctor Visits Reviewed, PCP  [Encouraged to scheduled PCP visit for follow up]  PCP/Specialist Visits Compliance with follow-up visit  Communication with PCP/Specialists  [Contacted PCP office to inquire about prescription for Covid]  Education Interventions   Education Provided Provided Education  Provided Verbal Education On When to see the doctor, Medication  [Advised to continue fluids, hot tea with lemon & honey.  Advised to monitor oxygen saturations, present to ED with difficulty breathing]              SDOH assessments and interventions completed:  No     Care Coordination Interventions:  Yes, provided   Follow up plan: Follow up call scheduled for 8/12    Encounter Outcome:  Pt. Visit Completed   Kemper Durie,  RN, MSN, [facility] Care Management Care Management Coordinator [phone]

## 2022-10-08 DIAGNOSIS — I10 Essential (primary) hypertension: Secondary | ICD-10-CM | POA: Diagnosis not present

## 2022-10-08 DIAGNOSIS — R002 Palpitations: Secondary | ICD-10-CM | POA: Diagnosis not present

## 2022-10-08 DIAGNOSIS — U099 Post covid-19 condition, unspecified: Secondary | ICD-10-CM | POA: Diagnosis not present

## 2022-10-08 DIAGNOSIS — E669 Obesity, unspecified: Secondary | ICD-10-CM | POA: Diagnosis not present

## 2022-10-08 DIAGNOSIS — G4733 Obstructive sleep apnea (adult) (pediatric): Secondary | ICD-10-CM | POA: Diagnosis not present

## 2022-10-08 DIAGNOSIS — E785 Hyperlipidemia, unspecified: Secondary | ICD-10-CM | POA: Diagnosis not present

## 2022-10-08 DIAGNOSIS — Z96641 Presence of right artificial hip joint: Secondary | ICD-10-CM | POA: Diagnosis not present

## 2022-10-09 NOTE — Progress Notes (Signed)
Name: Lisa Steele   MRN: [id number]    DOB: 10-Jul-1945   Date:10/10/2022       Progress Note  Subjective  Chief Complaint  Follow Up  HPI  HTN: taking bp medication daily. She denies SOB , palpitation or  dizziness, she had some chest pain in 2023 - under the care Dr. Juliann Pares, last stress test was negative . No side effects of medication . BP at our office was 142 last visit, today 132/70, at home it is in the high 130's/70's. Discussed adding Norvasc to get bp closer to 120/s. She agreed, discussed symptoms of orthostatic changes   Ear dryness: she was seen by ENT and given mometasone she is using it prn , no recent problems    Hyperlipidemia: taking Crestor, no myalgias, compliant with medications. Last LDL was below 100 .   Back pain with radiculitis down right leg, seen at [facility] and had MRI lumbar spine that showed herniated disc, she had one steroid injection and symptoms resolved , she stopped Lyrica . Doing well  OSA: she sees Dr. Meredeth Ide, she is back on full mask and tolerating it well. She has been feeling better when she wakes up, denies waking up with a headache   Morbid obesity: BMI above 35 with multiple co-morbidities, such as dyslipidemia , HTN and OA. She stopped exercising due to hip and knee pain, but is going to try going back to water aerobics   OA:she switched to [facility], having pain on both knees , but right is currently worse than left side She states pain is still present advised to take Tylenol daily   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   Status post total hip replacement, right 06/16/2019   Trigger finger of left thumb 03/04/2018   S/P rotator cuff repair 01/12/2018   Injury of left rotator cuff 11/03/2017   Primary osteoarthritis of both knees 06/22/2014   Dysmetabolic syndrome 06/22/2014   Post-menopausal 06/22/2014    H/O cold sores 06/22/2014   Dyslipidemia 06/22/2014   Elevated fasting blood sugar 06/22/2014   Benign essential HTN 06/22/2014   Abnormal ECG 06/22/2014    Past Surgical History:  Procedure Laterality Date   APPENDECTOMY     BREAST EXCISIONAL BIOPSY Left 1970   neg   BREAST SURGERY Left    cyst removed   BUNIONECTOMY Left 2008   CATARACT EXTRACTION, BILATERAL  10/2013   COLONOSCOPY     2005, 2009, 2013   COLONOSCOPY WITH PROPOFOL N/A 06/25/2016   Procedure: COLONOSCOPY WITH PROPOFOL;  Surgeon: Scot Jun, MD;  Location: [facility] ENDOSCOPY;  Service: Endoscopy;  Laterality: N/A;   COLONOSCOPY WITH PROPOFOL N/A 04/11/2021   Procedure: COLONOSCOPY WITH PROPOFOL;  Surgeon: Wyline Mood, MD;  Location: [facility] ENDOSCOPY;  Service: Gastroenterology;  Laterality: N/A;   ECTOPIC PREGNANCY SURGERY  1978   EYE SURGERY     JOINT REPLACEMENT     SHOULDER ARTHROSCOPY WITH ROTATOR CUFF REPAIR AND SUBACROMIAL DECOMPRESSION Left 11/25/2017   Procedure: SHOULDER ARTHROSCOPic release of long head biceps tendon and mini open rotator cuff repair;  Surgeon: Erin Sons, MD;  Location: [facility] ORS;  Service: Orthopedics;  Laterality: Left;   TONSILECTOMY, ADENOIDECTOMY, BILATERAL MYRINGOTOMY AND TUBES     TONSILLECTOMY     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   Procedure: TOTAL HIP ARTHROPLASTY ANTERIOR APPROACH;  Surgeon: Kennedy Bucker, MD;  Location: [facility] ORS;  Service: Orthopedics;  Laterality: Right;   TOTAL HIP ARTHROPLASTY Right 08/30/2020   Procedure: Right hip heterotopic ossification removal;  Surgeon: Kennedy Bucker, MD;  Location: [facility] ORS;  Service: Orthopedics;  Laterality: Right;   TUBAL LIGATION      Family History  Problem Relation Age of Onset   Diabetes Mother    Hypertension Mother    Heart disease Mother    Hyperlipidemia Mother    Heart disease Father    Diabetes Sister    Heart disease Sister    Hypertension Sister    Hyperlipidemia Sister    Breast cancer Sister 28    Cancer Sister 50       breast   Breast cancer Maternal Aunt    Diabetes Sister    Stroke Sister    Diabetes Sister    Diabetes Sister     Social History   Tobacco Use   Smoking status: Former    Current packs/day: 0.00    Types: Cigarettes    Start date: 03/03/1958    Quit date: 1980    Years since quitting: 44.6   Smokeless tobacco: Never  Substance Use Topics   Alcohol use: Yes    Alcohol/week: 0.0 standard drinks of alcohol    Comment: socially     Current Outpatient Medications:    amLODipine (NORVASC) 2.5 MG tablet, Take 1 tablet (2.5 mg total) by mouth daily., Disp: 90 tablet, Rfl: 0   aspirin EC 81 MG tablet, Take 1 tablet (81 mg total) by mouth every evening. Swallow whole., Disp: 90 tablet, Rfl: 3   clindamycin (CLEOCIN) 150 MG capsule, Take 150 mg by mouth as needed (Take 4 caps 1 hour prior to appt.)., Disp: , Rfl:    clotrimazole-betamethasone (LOTRISONE) cream, Apply 1 Application topically daily., Disp: 45 g, Rfl: 0   mometasone (ELOCON) 0.1 % lotion, Apply 1 each topically daily. For ear pruritus, Disp: 60 mL, Rfl: 0   Polyethyl Glycol-Propyl Glycol (SYSTANE) 0.4-0.3 % SOLN, Place 1 drop into both eyes at bedtime., Disp: , Rfl:    olmesartan-hydrochlorothiazide (BENICAR HCT) 40-25 MG tablet, Take 1 tablet by mouth daily., Disp: 90 tablet, Rfl: 1   rosuvastatin (CRESTOR) 20 MG tablet, Take 1 tablet (20 mg total) by mouth daily., Disp: 90 tablet, Rfl: 1  Allergies  Allergen Reactions   Tramadol Nausea And Vomiting   Doxycycline Itching   Penicillins Hives and Rash    leg lesions and weakness (couldn't walk)  Did it involve swelling of the face/tongue/throat, SOB, or low BP? No Did it involve sudden or severe rash/hives, skin peeling, or any reaction on the inside of your mouth or nose? No Did you need to seek medical attention at a hospital or doctor's office? Yes When did it last happen?      38 years If all above answers are "NO", may proceed with  cephalosporin use.    I personally reviewed active problem list, medication list, allergies, family history, social history, health maintenance with the patient/caregiver today.   ROS  Constitutional: Negative for fever or weight change.  Respiratory: Negative for cough and shortness of breath.   Cardiovascular: Negative for chest pain or palpitations.  Gastrointestinal: Negative for abdominal pain, no bowel changes.  Musculoskeletal: Negative for gait problem or joint swelling.  Skin: Negative for rash.  Neurological: Negative for dizziness or headache.  No other specific complaints in a complete review of systems (except as listed in HPI above).   Objective  Vitals:   10/10/22 0953 10/10/22 1002  BP: 139/63 132/70  Pulse: 86   Resp: 16   SpO2: 99%   Weight: 213 lb (96.6 kg)   Height: 5\' 3"  (1.6 m)     Body mass index is 37.73 kg/m.  Physical Exam  Constitutional: Patient appears well-developed and well-nourished. Obese  No distress.  HEENT: head atraumatic, normocephalic, pupils equal and reactive to light, neck supple Cardiovascular: Normal rate, regular rhythm and normal heart sounds.  No murmur heard. No BLE edema. Pulmonary/Chest: Effort normal and breath sounds normal. No respiratory distress. Abdominal: Soft.  There is no tenderness. Muscular skeletal: crepitus with extension of knees Psychiatric: Patient has a normal mood and affect. behavior is normal. Judgment and thought content normal.   PHQ2/9:    10/10/2022    9:53 AM 04/10/2022   10:07 AM 03/25/2022    8:48 AM 11/06/2021   11:50 AM 10/10/2021    8:01 AM  Depression screen PHQ 2/9  Decreased Interest 0 0 0 0 0  Down, Depressed, Hopeless 0 0 0 0 0  PHQ - 2 Score 0 0 0 0 0  Altered sleeping 0 0 0  0  Tired, decreased energy 0 0 0  0  Change in appetite 0 0 0  0  Feeling bad or failure about yourself  0 0 0  0  Trouble concentrating 0 0 0  0  Moving slowly or fidgety/restless 0 0 0  0  Suicidal thoughts  0 0 0  0  PHQ-9 Score 0 0 0  0  Difficult doing work/chores   Not difficult at all      phq 9 is negative   Fall Risk:    10/10/2022    9:53 AM 04/10/2022   10:06 AM 03/25/2022    8:48 AM 10/10/2021    8:01 AM 04/05/2021   10:46 AM  Fall Risk   Falls in the past year? 0 1 0 0 0  Number falls in past yr: 0  0 0 0  Injury with Fall? 0  0 0 0  Risk for fall due to : No Fall Risks History of fall(s) No Fall Risks No Fall Risks No Fall Risks  Follow up Falls prevention discussed Falls prevention discussed;Education provided;Falls evaluation completed Falls prevention discussed;Education provided;Falls evaluation completed Falls prevention discussed Falls prevention discussed      Functional Status Survey: Is the patient deaf or have difficulty hearing?: No Does the patient have difficulty seeing, even when wearing glasses/contacts?: No Does the patient have difficulty concentrating, remembering, or making decisions?: No Does the patient have difficulty walking or climbing stairs?: No Does the patient have difficulty dressing or bathing?: No Does the patient have difficulty doing errands alone such as visiting a doctor's office or shopping?: No    Assessment & Plan  1. Morbid obesity (HCC)  Discussed with the patient the risk posed by an increased BMI. Discussed importance of portion control, calorie counting and at least 150 minutes of physical activity weekly. Avoid sweet beverages and drink more water. Eat at least 6 servings of fruit and vegetables daily    2. Benign essential HTN  - olmesartan-hydrochlorothiazide (BENICAR HCT) 40-25 MG tablet; Take 1 tablet by mouth daily.  Dispense: 90 tablet; Refill: 1  3. Dyslipidemia  - rosuvastatin (CRESTOR) 20 MG tablet; Take 1 tablet (20 mg total) by mouth daily.  Dispense: 90 tablet; Refill: 1  4. OSA on CPAP  Compliant   5. Primary osteoarthritis of  both knees  Advised to take Tylenol tid daily max of 2 g per day

## 2022-10-10 ENCOUNTER — Ambulatory Visit (INDEPENDENT_AMBULATORY_CARE_PROVIDER_SITE_OTHER): Payer: Medicare Other | Admitting: Family Medicine

## 2022-10-10 ENCOUNTER — Encounter: Payer: Self-pay | Admitting: Family Medicine

## 2022-10-10 DIAGNOSIS — G4733 Obstructive sleep apnea (adult) (pediatric): Secondary | ICD-10-CM

## 2022-10-10 DIAGNOSIS — E785 Hyperlipidemia, unspecified: Secondary | ICD-10-CM | POA: Diagnosis not present

## 2022-10-10 DIAGNOSIS — M170 Bilateral primary osteoarthritis of knee: Secondary | ICD-10-CM | POA: Diagnosis not present

## 2022-10-10 DIAGNOSIS — I10 Essential (primary) hypertension: Secondary | ICD-10-CM | POA: Diagnosis not present

## 2022-10-10 MED ORDER — AMLODIPINE BESYLATE 2.5 MG PO TABS: 2.5000 mg | ORAL_TABLET | Freq: Every day | ORAL | 0 refills | Status: DC

## 2022-10-10 MED ORDER — OLMESARTAN MEDOXOMIL-HCTZ 40-25 MG PO TABS: 1.0000 | ORAL_TABLET | Freq: Every day | ORAL | 1 refills | Status: DC

## 2022-10-10 MED ORDER — ROSUVASTATIN CALCIUM 20 MG PO TABS
20.0000 mg | ORAL_TABLET | Freq: Every day | ORAL | 1 refills | Status: DC
Start: 2022-10-10 — End: 2023-02-11

## 2022-10-13 ENCOUNTER — Ambulatory Visit: Payer: Self-pay | Admitting: *Deleted

## 2022-10-13 NOTE — Patient Outreach (Signed)
  Care Coordination   Follow Up Visit Note   10/13/2022 Name: Lisa Steele MRN: [id number] DOB: [birth year]  Lisa Steele is a 77 y.o. year old female who sees [name], MD for primary care. I spoke with  Lisa Steele by phone today.  What matters to the patients health and wellness today?  Continue pain management.  Happy to have recovered from Covid     Goals Addressed             This Visit's Progress    COMPLETED: Recover from Covid   On track     Interventions Today    Flowsheet Row Most Recent Value  Chronic Disease   Chronic disease during today's visit Other  [Covid]  General Interventions   General Interventions Discussed/Reviewed Doctor Visits, General Interventions Reviewed  Doctor Visits Discussed/Reviewed Doctor Visits Reviewed  Gaylord Shih 8/29, pulmonary 10/14]  PCP/Specialist Visits Compliance with follow-up visit  Education Interventions   Education Provided Provided Education  Provided Verbal Education On When to see the doctor, Medication  [Had course of Paxlovid, report complete recovery from Covid]              SDOH assessments and interventions completed:  No     Care Coordination Interventions:  Yes, provided   Follow up plan: Follow up call scheduled for 10/16    Encounter Outcome:  Pt. Visit Completed   Kemper Durie, RN, MSN, [facility] Care Management Care Management Coordinator [phone]

## 2022-11-06 ENCOUNTER — Telehealth: Payer: Self-pay | Admitting: *Deleted

## 2022-11-06 NOTE — Patient Outreach (Signed)
  Care Coordination   Follow Up Visit Note   11/06/2022 Name: Chantina Laidig MRN: [id number] DOB: 07/16/1945  Jon Rieth is a 77 y.o. year old female who sees [name], MD for primary care. I spoke with  [name] by phone today.  What matters to the patients health and wellness today?  Finding out recommendations for vaccinations after having Covid last month    Goals Addressed             This Visit's Progress    RNCM: Effective Management of Pain   On track    Care Coordination Interventions: Reviewed provider established plan for pain management. Discussed importance of adherence to all scheduled medical appointments. Counseled on the importance of reporting any/all new or changed pain symptoms or management strategies to pain management provider Advised patient to report to care team affect of pain on daily activities Discussed use of relaxation techniques and/or diversional activities to assist with pain reduction (distraction, imagery, relaxation, massage, acupressure, TENS, heat, and cold application Reviewed with patient prescribed pharmacological and nonpharmacological pain relief strategies Advised patient to discuss unresolved pain, or changes in level or intensity of pain with provider The patient agrees to work with the [facility] and agrees to regular outreaches. Review of the care coordination program and how the program works. The patient knows how to get in touch with the [facility]        SDOH assessments and interventions completed:  No     Care Coordination Interventions:  Yes, provided   Interventions Today    Flowsheet Row Most Recent Value  Chronic Disease   Chronic disease during today's visit Other  General Interventions   General Interventions Discussed/Reviewed Vaccines, Doctor Visits  Vaccines COVID-19, Flu  [Advised per standard recommendations, Covid vaccinations and flu vaccinations should be administered about 3 months after Covid  infection, also advised to speak with provider for specific recommendations]  Doctor Visits Discussed/Reviewed Doctor Visits Reviewed, Specialist  [pulmonary 10/14]  PCP/Specialist Visits Compliance with follow-up visit       Follow up plan: Follow up call scheduled for 10/16    Encounter Outcome:  Patient Visit Completed   Kemper Durie, RN, MSN, [facility] Care Management Care Management Coordinator [phone]

## 2022-12-04 DIAGNOSIS — M1711 Unilateral primary osteoarthritis, right knee: Secondary | ICD-10-CM | POA: Diagnosis not present

## 2022-12-09 ENCOUNTER — Telehealth: Payer: Self-pay

## 2022-12-09 NOTE — Telephone Encounter (Signed)
Appt scheduled

## 2022-12-11 DIAGNOSIS — M1711 Unilateral primary osteoarthritis, right knee: Secondary | ICD-10-CM | POA: Diagnosis not present

## 2022-12-15 DIAGNOSIS — E663 Overweight: Secondary | ICD-10-CM | POA: Diagnosis not present

## 2022-12-15 DIAGNOSIS — G4733 Obstructive sleep apnea (adult) (pediatric): Secondary | ICD-10-CM | POA: Diagnosis not present

## 2022-12-15 DIAGNOSIS — Z01818 Encounter for other preprocedural examination: Secondary | ICD-10-CM | POA: Diagnosis not present

## 2022-12-16 ENCOUNTER — Other Ambulatory Visit: Payer: Self-pay | Admitting: Family Medicine

## 2022-12-17 ENCOUNTER — Ambulatory Visit: Payer: Self-pay | Admitting: *Deleted

## 2022-12-17 NOTE — Patient Outreach (Signed)
  Care Coordination   Follow Up Visit Note   12/17/2022 Name: Tressa Maldonado MRN: [id number] DOB: 07/02/1945  Anaiyah Anglemyer is a 77 y.o. year old female who sees [name], MD for primary care. I spoke with  [name] by phone today.  What matters to the patients health and wellness today?  Getting clearance for upcoming knee replacement    Goals Addressed             This Visit's Progress    RNCM: Effective Management of Pain   On track    Care Coordination Interventions: Reviewed provider established plan for pain management. Discussed importance of adherence to all scheduled medical appointments. Counseled on the importance of reporting any/all new or changed pain symptoms or management strategies to pain management provider Advised patient to report to care team affect of pain on daily activities Discussed use of relaxation techniques and/or diversional activities to assist with pain reduction (distraction, imagery, relaxation, massage, acupressure, TENS, heat, and cold application Reviewed with patient prescribed pharmacological and nonpharmacological pain relief strategies Advised patient to discuss unresolved pain, or changes in level or intensity of pain with provider The patient agrees to work with the [facility] and agrees to regular outreaches. Review of the care coordination program and how the program works. The patient knows how to get in touch with the [facility]        SDOH assessments and interventions completed:  No     Care Coordination Interventions:  Yes, provided   Interventions Today    Flowsheet Row Most Recent Value  Chronic Disease   Chronic disease during today's visit Other  [chronic knee pain/osteoarthritis]  General Interventions   General Interventions Discussed/Reviewed General Interventions Reviewed, Doctor Visits, Vaccines  Vaccines Flu, COVID-19  [Will request Covid and flu shots prior to surgery]  Doctor Visits Discussed/Reviewed Doctor  Visits Reviewed, Specialist  [knee replacement scheduled for 11/5, will see PCP tomorrow for medical clearance]  PCP/Specialist Visits Compliance with follow-up visit  Education Interventions   Education Provided Provided Education  Provided Verbal Education On When to see the doctor, Other  [Discussed needing additional support in the home post surgery, will have husband and sister]       Follow up plan: Follow up call scheduled for 11/11    Encounter Outcome:  Patient Visit Completed   Kemper Durie RN, MSN, CCM [place]  [facility], [facility] RN Care Coordinator Direct Dial: [phone] / Main [phone] Fax [phone] Email: [email or web address] Website: [email or web address]

## 2022-12-17 NOTE — Progress Notes (Unsigned)
Name: Lisa Steele   MRN: [id number]    DOB: [birth year]   Date:12/18/2022       Progress Note  Subjective  Chief Complaint  Surgical Clearance  HPI  She has chronic knee pain due to OA both knees but right knee is worse than left , seeing Ortho and came in for pre op clearance by the request of Dr. Martha Clan, for total right knee replacement surgery that will be done  in [place] , [place].   Pain level on right knee is 6-7/10 but constant. It is affecting her qualify of life   She had surgeries in the past and no complications of anesthesia in the past. She had labs done by Ortho and reviewed today. No anemia, normal A1C , platelets.   She denies decrease in exercise tolerance. She is able to do water aerobics.   She does not have a personal history of DVT, strokes and CAD .   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   Status post total hip replacement, right 06/16/2019   Trigger finger of left thumb 03/04/2018   S/P rotator cuff repair 01/12/2018   Injury of left rotator cuff 11/03/2017   Primary osteoarthritis of both knees 06/22/2014   Dysmetabolic syndrome 06/22/2014   Post-menopausal 06/22/2014   H/O cold sores 06/22/2014   Dyslipidemia 06/22/2014   Elevated fasting blood sugar 06/22/2014   Benign essential HTN 06/22/2014   Abnormal ECG 06/22/2014    Past Surgical History:  Procedure Laterality Date   APPENDECTOMY     BREAST EXCISIONAL BIOPSY Left 1970   neg   BREAST SURGERY Left    cyst removed    BUNIONECTOMY Left 2008   CATARACT EXTRACTION, BILATERAL  10/2013   COLONOSCOPY     2005, 2009, 2013   COLONOSCOPY WITH PROPOFOL N/A 06/25/2016   Procedure: COLONOSCOPY WITH PROPOFOL;  Surgeon: Scot Jun, MD;  Location: [facility] ENDOSCOPY;  Service: Endoscopy;  Laterality: N/A;   COLONOSCOPY WITH PROPOFOL N/A 04/11/2021   Procedure: COLONOSCOPY WITH PROPOFOL;  Surgeon: Wyline Mood, MD;  Location: [facility] ENDOSCOPY;  Service: Gastroenterology;  Laterality: N/A;   ECTOPIC PREGNANCY SURGERY  1978   EYE SURGERY     JOINT REPLACEMENT     SHOULDER ARTHROSCOPY WITH ROTATOR CUFF REPAIR AND SUBACROMIAL DECOMPRESSION Left 11/25/2017   Procedure: SHOULDER ARTHROSCOPic release of long head biceps tendon and mini open rotator cuff repair;  Surgeon: Erin Sons, MD;  Location: [facility] ORS;  Service: Orthopedics;  Laterality: Left;   TONSILECTOMY, ADENOIDECTOMY, BILATERAL MYRINGOTOMY AND TUBES     TONSILLECTOMY     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   Procedure: TOTAL HIP ARTHROPLASTY ANTERIOR APPROACH;  Surgeon: Kennedy Bucker, MD;  Location: [facility] ORS;  Service: Orthopedics;  Laterality: Right;   TOTAL HIP ARTHROPLASTY Right 08/30/2020   Procedure: Right hip heterotopic ossification removal;  Surgeon: Kennedy Bucker, MD;  Location: [facility] ORS;  Service: Orthopedics;  Laterality: Right;   TUBAL LIGATION  Family History  Problem Relation Age of Onset   Diabetes Mother    Hypertension Mother    Heart disease Mother    Hyperlipidemia Mother    Heart disease Father    Diabetes Sister    Heart disease Sister    Hypertension Sister    Hyperlipidemia Sister    Breast cancer Sister 36   Cancer Sister 63       breast   Breast cancer Maternal Aunt    Diabetes Sister    Stroke Sister    Diabetes Sister    Diabetes Sister     Social History   Tobacco Use   Smoking status: Former    Current packs/day: 0.00    Types: Cigarettes    Start date: 03/03/1958    Quit date: 1980    Years since  quitting: 44.8   Smokeless tobacco: Never  Substance Use Topics   Alcohol use: Yes    Alcohol/week: 0.0 standard drinks of alcohol    Comment: socially     Current Outpatient Medications:    aspirin EC 81 MG tablet, Take 1 tablet (81 mg total) by mouth every evening. Swallow whole., Disp: 90 tablet, Rfl: 3   clindamycin (CLEOCIN) 150 MG capsule, Take 150 mg by mouth as needed (Take 4 caps 1 hour prior to appt.)., Disp: , Rfl:    clotrimazole-betamethasone (LOTRISONE) cream, Apply 1 Application topically daily., Disp: 45 g, Rfl: 0   mometasone (ELOCON) 0.1 % lotion, Apply 1 each topically daily. For ear pruritus, Disp: 60 mL, Rfl: 0   olmesartan-hydrochlorothiazide (BENICAR HCT) 40-25 MG tablet, Take 1 tablet by mouth daily., Disp: 90 tablet, Rfl: 1   Polyethyl Glycol-Propyl Glycol (SYSTANE) 0.4-0.3 % SOLN, Place 1 drop into both eyes at bedtime., Disp: , Rfl:    rosuvastatin (CRESTOR) 20 MG tablet, Take 1 tablet (20 mg total) by mouth daily., Disp: 90 tablet, Rfl: 1   amLODipine (NORVASC) 2.5 MG tablet, Take 1 tablet (2.5 mg total) by mouth daily., Disp: 90 tablet, Rfl: 0  Allergies  Allergen Reactions   Tramadol Nausea And Vomiting   Doxycycline Itching   Penicillins Hives and Rash    leg lesions and weakness (couldn't walk)  Did it involve swelling of the face/tongue/throat, SOB, or low BP? No Did it involve sudden or severe rash/hives, skin peeling, or any reaction on the inside of your mouth or nose? No Did you need to seek medical attention at a hospital or doctor's office? Yes When did it last happen?      38 years If all above answers are "NO", may proceed with cephalosporin use.    I personally reviewed active problem list, medication list, allergies, family history, social history, health maintenance with the patient/caregiver today.   ROS  Ten systems reviewed and is negative except as mentioned in HPI    Objective  Vitals:   12/18/22 1008  BP: 124/68  Pulse: 88   Resp: 14  Temp: 97.7 F (36.5 C)  TempSrc: Oral  SpO2: 98%  Weight: 210 lb 12.8 oz (95.6 kg)  Height: 5\' 3"  (1.6 m)    Body mass index is 37.34 kg/m.  Physical Exam  Constitutional: Patient appears well-developed and well-nourished. Obese  No distress.  HEENT: head atraumatic, normocephalic, pupils equal and reactive to light, neck supple Cardiovascular: Normal rate, regular rhythm and normal heart sounds.  No murmur heard. BLE edema, left lower extremity worse than right, calf pain on left side. Pulmonary/Chest: Effort normal and breath  sounds normal. No respiratory distress. Muscular skeletal: pain with rom of both knees, using a cane Abdominal: Soft.  There is no tenderness. Psychiatric: Patient has a normal mood and affect. behavior is normal. Judgment and thought content normal.   PHQ2/9:    12/18/2022   10:10 AM 10/10/2022    9:53 AM 04/10/2022   10:07 AM 03/25/2022    8:48 AM 11/06/2021   11:50 AM  Depression screen PHQ 2/9  Decreased Interest 0 0 0 0 0  Down, Depressed, Hopeless 0 0 0 0 0  PHQ - 2 Score 0 0 0 0 0  Altered sleeping 0 0 0 0   Tired, decreased energy 0 0 0 0   Change in appetite 0 0 0 0   Feeling bad or failure about yourself  0 0 0 0   Trouble concentrating 0 0 0 0   Moving slowly or fidgety/restless 0 0 0 0   Suicidal thoughts 0 0 0 0   PHQ-9 Score 0 0 0 0   Difficult doing work/chores    Not difficult at all     phq 9 is negative   Fall Risk:    12/18/2022   10:10 AM 10/10/2022    9:53 AM 04/10/2022   10:06 AM 03/25/2022    8:48 AM 10/10/2021    8:01 AM  Fall Risk   Falls in the past year? 0 0 1 0 0  Number falls in past yr:  0  0 0  Injury with Fall?  0  0 0  Risk for fall due to : No Fall Risks No Fall Risks History of fall(s) No Fall Risks No Fall Risks  Follow up Falls prevention discussed;Education provided;Falls evaluation completed Falls prevention discussed Falls prevention discussed;Education provided;Falls evaluation completed Falls  prevention discussed;Education provided;Falls evaluation completed Falls prevention discussed     Assessment & Plan   1. Benign essential HTN  - amLODipine (NORVASC) 2.5 MG tablet; Take 1 tablet (2.5 mg total) by mouth daily.  Dispense: 90 tablet; Refill: 0  2. Need for immunization against influenza  - Flu Vaccine Trivalent High Dose (Fluad)  3. Primary osteoarthritis of both knees  Causing pain   4. Chronic pain of right knee  Going to have surgery done soon   5. Pre-op evaluation  Clear if doppler US is negative  6. Edema of left lower extremity  - VAS [place] LOWER EXTREMITY VENOUS (DVT); Future  7. Pain of left calf  - VAS [place] LOWER EXTREMITY VENOUS (DVT); Future     It may be secondary to OA knee but we will check doppler prior to clearing her for surgery

## 2022-12-18 ENCOUNTER — Encounter: Payer: Self-pay | Admitting: Family Medicine

## 2022-12-18 ENCOUNTER — Ambulatory Visit (INDEPENDENT_AMBULATORY_CARE_PROVIDER_SITE_OTHER): Payer: Medicare Other | Admitting: Family Medicine

## 2022-12-18 VITALS — BP 124/68 | HR 88 | Temp 97.7°F | Resp 14 | Ht 63.0 in | Wt 210.8 lb

## 2022-12-18 DIAGNOSIS — M170 Bilateral primary osteoarthritis of knee: Secondary | ICD-10-CM | POA: Diagnosis not present

## 2022-12-18 DIAGNOSIS — G8929 Other chronic pain: Secondary | ICD-10-CM | POA: Diagnosis not present

## 2022-12-18 DIAGNOSIS — Z01818 Encounter for other preprocedural examination: Secondary | ICD-10-CM | POA: Diagnosis not present

## 2022-12-18 DIAGNOSIS — R600 Localized edema: Secondary | ICD-10-CM

## 2022-12-18 DIAGNOSIS — I10 Essential (primary) hypertension: Secondary | ICD-10-CM

## 2022-12-18 DIAGNOSIS — M79662 Pain in left lower leg: Secondary | ICD-10-CM

## 2022-12-18 DIAGNOSIS — Z23 Encounter for immunization: Secondary | ICD-10-CM

## 2022-12-18 DIAGNOSIS — M25561 Pain in right knee: Secondary | ICD-10-CM | POA: Diagnosis not present

## 2022-12-18 MED ORDER — AMLODIPINE BESYLATE 2.5 MG PO TABS
2.5000 mg | ORAL_TABLET | Freq: Every day | ORAL | 0 refills | Status: DC
Start: 2022-12-18 — End: 2023-02-11

## 2022-12-18 NOTE — Addendum Note (Signed)
Addended by: [name] F on: 12/18/2022 12:46 PM   Modules accepted: Orders

## 2022-12-23 ENCOUNTER — Ambulatory Visit
Admission: RE | Admit: 2022-12-23 | Discharge: 2022-12-23 | Disposition: A | Discharge status: Home / Self Care | Payer: Medicare Other | Source: Ambulatory Visit | Attending: Family Medicine | Admitting: Family Medicine

## 2022-12-23 DIAGNOSIS — M79662 Pain in left lower leg: Secondary | ICD-10-CM | POA: Diagnosis not present

## 2022-12-23 DIAGNOSIS — R600 Localized edema: Secondary | ICD-10-CM | POA: Diagnosis not present

## 2022-12-25 DIAGNOSIS — M25551 Pain in right hip: Secondary | ICD-10-CM | POA: Diagnosis not present

## 2022-12-25 DIAGNOSIS — M533 Sacrococcygeal disorders, not elsewhere classified: Secondary | ICD-10-CM | POA: Diagnosis not present

## 2022-12-25 DIAGNOSIS — M5416 Radiculopathy, lumbar region: Secondary | ICD-10-CM | POA: Diagnosis not present

## 2022-12-25 DIAGNOSIS — M170 Bilateral primary osteoarthritis of knee: Secondary | ICD-10-CM | POA: Diagnosis not present

## 2022-12-25 DIAGNOSIS — M51369 Other intervertebral disc degeneration, lumbar region without mention of lumbar back pain or lower extremity pain: Secondary | ICD-10-CM | POA: Diagnosis not present

## 2022-12-25 DIAGNOSIS — M545 Low back pain, unspecified: Secondary | ICD-10-CM | POA: Diagnosis not present

## 2022-12-25 DIAGNOSIS — M25562 Pain in left knee: Secondary | ICD-10-CM | POA: Diagnosis not present

## 2022-12-25 DIAGNOSIS — M7061 Trochanteric bursitis, right hip: Secondary | ICD-10-CM | POA: Diagnosis not present

## 2022-12-25 DIAGNOSIS — M47896 Other spondylosis, lumbar region: Secondary | ICD-10-CM | POA: Diagnosis not present

## 2022-12-25 DIAGNOSIS — M25561 Pain in right knee: Secondary | ICD-10-CM | POA: Diagnosis not present

## 2022-12-25 DIAGNOSIS — M48061 Spinal stenosis, lumbar region without neurogenic claudication: Secondary | ICD-10-CM | POA: Diagnosis not present

## 2023-01-01 ENCOUNTER — Other Ambulatory Visit: Payer: Self-pay | Admitting: Family Medicine

## 2023-01-01 DIAGNOSIS — I10 Essential (primary) hypertension: Secondary | ICD-10-CM

## 2023-01-02 ENCOUNTER — Telehealth: Payer: Self-pay | Admitting: Family Medicine

## 2023-01-02 DIAGNOSIS — Z01812 Encounter for preprocedural laboratory examination: Secondary | ICD-10-CM | POA: Diagnosis not present

## 2023-01-02 DIAGNOSIS — M1711 Unilateral primary osteoarthritis, right knee: Secondary | ICD-10-CM | POA: Diagnosis not present

## 2023-01-02 NOTE — Telephone Encounter (Signed)
Patient called and states she had received a call to schedule another ultrasound and wanted to know if this was a repeat or if PCP did not get her results? Patient states that she already had one ultrasound done last week and wants to know why she received a call to schedule another ultrasound? Patient states she is scheduled for knee surgery on November 5th. Please follow back up with the patient @ #[phone].

## 2023-01-02 NOTE — Telephone Encounter (Signed)
Called patient w/no answer

## 2023-01-06 DIAGNOSIS — N289 Disorder of kidney and ureter, unspecified: Secondary | ICD-10-CM | POA: Diagnosis not present

## 2023-01-06 DIAGNOSIS — E785 Hyperlipidemia, unspecified: Secondary | ICD-10-CM | POA: Diagnosis not present

## 2023-01-06 DIAGNOSIS — Z7982 Long term (current) use of aspirin: Secondary | ICD-10-CM | POA: Diagnosis not present

## 2023-01-06 DIAGNOSIS — M170 Bilateral primary osteoarthritis of knee: Secondary | ICD-10-CM | POA: Diagnosis not present

## 2023-01-06 DIAGNOSIS — G8918 Other acute postprocedural pain: Secondary | ICD-10-CM | POA: Diagnosis not present

## 2023-01-06 DIAGNOSIS — G4733 Obstructive sleep apnea (adult) (pediatric): Secondary | ICD-10-CM | POA: Diagnosis not present

## 2023-01-06 DIAGNOSIS — E669 Obesity, unspecified: Secondary | ICD-10-CM | POA: Diagnosis not present

## 2023-01-06 DIAGNOSIS — Z87891 Personal history of nicotine dependence: Secondary | ICD-10-CM | POA: Diagnosis not present

## 2023-01-06 DIAGNOSIS — Z23 Encounter for immunization: Secondary | ICD-10-CM | POA: Diagnosis not present

## 2023-01-06 DIAGNOSIS — M1711 Unilateral primary osteoarthritis, right knee: Secondary | ICD-10-CM | POA: Diagnosis not present

## 2023-01-06 DIAGNOSIS — Z96641 Presence of right artificial hip joint: Secondary | ICD-10-CM | POA: Diagnosis not present

## 2023-01-06 DIAGNOSIS — Z6837 Body mass index (BMI) 37.0-37.9, adult: Secondary | ICD-10-CM | POA: Diagnosis not present

## 2023-01-06 DIAGNOSIS — N179 Acute kidney failure, unspecified: Secondary | ICD-10-CM | POA: Diagnosis not present

## 2023-01-06 DIAGNOSIS — I10 Essential (primary) hypertension: Secondary | ICD-10-CM | POA: Diagnosis not present

## 2023-01-06 HISTORY — PX: REPLACEMENT TOTAL KNEE: SUR1224

## 2023-01-07 DIAGNOSIS — Z87891 Personal history of nicotine dependence: Secondary | ICD-10-CM | POA: Diagnosis not present

## 2023-01-07 DIAGNOSIS — E785 Hyperlipidemia, unspecified: Secondary | ICD-10-CM | POA: Diagnosis not present

## 2023-01-07 DIAGNOSIS — M170 Bilateral primary osteoarthritis of knee: Secondary | ICD-10-CM | POA: Diagnosis not present

## 2023-01-07 DIAGNOSIS — I10 Essential (primary) hypertension: Secondary | ICD-10-CM | POA: Diagnosis not present

## 2023-01-07 DIAGNOSIS — N289 Disorder of kidney and ureter, unspecified: Secondary | ICD-10-CM | POA: Diagnosis not present

## 2023-01-07 DIAGNOSIS — E669 Obesity, unspecified: Secondary | ICD-10-CM | POA: Diagnosis not present

## 2023-01-09 DIAGNOSIS — M25561 Pain in right knee: Secondary | ICD-10-CM | POA: Diagnosis not present

## 2023-01-12 ENCOUNTER — Ambulatory Visit: Payer: Self-pay | Admitting: *Deleted

## 2023-01-12 NOTE — Patient Outreach (Signed)
  Care Coordination   Follow Up Visit Note   01/12/2023 Name: Lisa Steele MRN: [id number] DOB: June 06, 1945  Lisa Steele is a 77 y.o. year old female who sees Lisa Cory, MD for primary care. I spoke with  Lisa Steele by phone today.  What matters to the patients health and wellness today?  Patient had knee replacement on 11/5, had assessment at outpatient therapy center on 11/8, start sessions this week. Denies any urgent concerns, encouraged to contact this care manager with questions.      Goals Addressed             This Visit's Progress    RNCM: Effective Management of Pain   On track    Care Coordination Interventions: Reviewed provider established plan for pain management. Discussed importance of adherence to all scheduled medical appointments. Counseled on the importance of reporting any/all new or changed pain symptoms or management strategies to pain management provider Advised patient to report to care team affect of pain on daily activities Discussed use of relaxation techniques and/or diversional activities to assist with pain reduction (distraction, imagery, relaxation, massage, acupressure, TENS, heat, and cold application Reviewed with patient prescribed pharmacological and nonpharmacological pain relief strategies Advised patient to discuss unresolved pain, or changes in level or intensity of pain with provider The patient agrees to work with the [facility] and agrees to regular outreaches. Review of the care coordination program and how the program works. The patient knows how to get in touch with the [facility]        SDOH assessments and interventions completed:  No     Care Coordination Interventions:  Yes, provided   Interventions Today    Flowsheet Row Most Recent Value  Chronic Disease   Chronic disease during today's visit Other, Hypertension (HTN)  [Recent knee surgery]  General Interventions   General Interventions Discussed/Reviewed General  Interventions Reviewed, Doctor Visits, Vaccines  Vaccines Flu, COVID-19  [flu vaccine done, will receive covid one later this month]  Doctor Visits Discussed/Reviewed PCP, Specialist, Doctor Visits Reviewed  [upcoming with ortho on 11/15, PCP 12/11.  Multiple outpatient PT sessions scheduled, twice a week]  PCP/Specialist Visits Compliance with follow-up visit  Education Interventions   Education Provided Provided Education  Provided Verbal Education On Medication, When to see the doctor, Other  [Pain managment discussed, using aspirin and Tylenol]       Follow up plan: Follow up call scheduled for 12/13    Encounter Outcome:  Patient Visit Completed   Kemper Durie RN, MSN, CCM [place]  [facility], [facility] RN Care Coordinator Direct Dial: [phone] / Main [phone] Fax [phone] Email: [email or web address]: [email or web address]

## 2023-01-14 DIAGNOSIS — M25561 Pain in right knee: Secondary | ICD-10-CM | POA: Diagnosis not present

## 2023-01-16 DIAGNOSIS — Z96651 Presence of right artificial knee joint: Secondary | ICD-10-CM | POA: Diagnosis not present

## 2023-01-16 DIAGNOSIS — M25561 Pain in right knee: Secondary | ICD-10-CM | POA: Diagnosis not present

## 2023-01-21 DIAGNOSIS — M25561 Pain in right knee: Secondary | ICD-10-CM | POA: Diagnosis not present

## 2023-01-23 DIAGNOSIS — M25561 Pain in right knee: Secondary | ICD-10-CM | POA: Diagnosis not present

## 2023-01-27 DIAGNOSIS — M25561 Pain in right knee: Secondary | ICD-10-CM | POA: Diagnosis not present

## 2023-02-03 DIAGNOSIS — M25561 Pain in right knee: Secondary | ICD-10-CM | POA: Diagnosis not present

## 2023-02-03 NOTE — Progress Notes (Signed)
Name: Lisa Steele   MRN: [id number]    DOB: [birth year]   Date:02/11/2023       Progress Note  Subjective  Chief Complaint  Chief Complaint  Patient presents with   Medical Management of Chronic Issues    Pt states her rx of amlodipine that was prescribed on 12/18/22 she never received and has been out of her medicine for about 2-3 weeks.    HPI  Discussed the use of AI scribe software for clinical note transcription with the patient, who gave verbal consent to proceed.  History of Present Illness   The patient, with a history of hypertension, hyperlipidemia, and obstructive sleep apnea, presents for a follow-up visit after recent right total knee replacement surgery performed on November 5th. She reports that her blood pressure has been elevated since she ran out of amlodipine approximately three weeks ago. Despite taking her regular blood pressure medication, her readings have been in the 140s. Prior to discontinuing amlodipine, her blood pressure was typically around 118/70.  The patient has been adhering to her prescribed physical therapy regimen following her knee surgery. She reports some difficulty in achieving full range of motion, with the last measurement indicating a 70-degree bend in the knee. She expresses a desire to return to her previous activities, including water aerobics and a senior circuit at a local gym, once she has fully recovered from her surgery.  The patient also mentions that she has been experiencing some swelling in her right lower leg, which she attributes to the recent surgery. She has been managing post-operative pain with Tylenol, having only used stronger pain medication for a few days immediately after the surgery.  In addition to her recent knee surgery, the patient has a history of multiple joint replacements, including a total hip replacement on the right side, which required a revision due to a bone spur growing over the hardware. She also had a left  shoulder replacement in 2019. She plans to have her left knee replaced in the future, but wishes to fully recover from her recent surgery first.  The patient is compliant with her CPAP machine for sleep apnea and reports no issues with its use. She also mentions that a doctor at a specialty hospital in [place] noted that she was not completely emptying her bladder, although the patient reports no symptoms such as urgency, frequency, or burning. She is currently taking rosuvastatin for hyperlipidemia and is due for a COVID-19 booster shot.         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   Status post total hip replacement, right 06/16/2019   Trigger finger of left thumb 03/04/2018   S/P rotator cuff repair 01/12/2018   Injury of left rotator cuff 11/03/2017   Primary osteoarthritis of both knees 06/22/2014   Dysmetabolic syndrome 06/22/2014   Post-menopausal 06/22/2014   H/O cold sores 06/22/2014   Dyslipidemia 06/22/2014   Elevated fasting blood sugar 06/22/2014   Benign essential HTN 06/22/2014   Abnormal ECG 06/22/2014    Past Surgical History:  Procedure Laterality Date   APPENDECTOMY     BREAST EXCISIONAL BIOPSY Left 1970   neg   BUNIONECTOMY Left 2008   CATARACT EXTRACTION, BILATERAL  10/2013   COLONOSCOPY     2005, 2009, 2013   COLONOSCOPY WITH PROPOFOL N/A 06/25/2016   Procedure: COLONOSCOPY WITH PROPOFOL;  Surgeon: Scot Jun,  MD;  Location: [facility] ENDOSCOPY;  Service: Endoscopy;  Laterality: N/A;   COLONOSCOPY WITH PROPOFOL N/A 04/11/2021   Procedure: COLONOSCOPY WITH PROPOFOL;  Surgeon: Wyline Mood, MD;  Location: [facility] ENDOSCOPY;  Service: Gastroenterology;  Laterality: N/A;   ECTOPIC PREGNANCY SURGERY  1978   REPLACEMENT TOTAL KNEE Right 01/06/2023   Dr. Novella Olive   SHOULDER ARTHROSCOPY WITH ROTATOR CUFF REPAIR AND SUBACROMIAL DECOMPRESSION  Left 11/25/2017   Procedure: SHOULDER ARTHROSCOPic release of long head biceps tendon and mini open rotator cuff repair;  Surgeon: Erin Sons, MD;  Location: [facility] ORS;  Service: Orthopedics;  Laterality: Left;   TONSILECTOMY, ADENOIDECTOMY, BILATERAL MYRINGOTOMY AND TUBES     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   Procedure: TOTAL HIP ARTHROPLASTY ANTERIOR APPROACH;  Surgeon: Kennedy Bucker, MD;  Location: [facility] ORS;  Service: Orthopedics;  Laterality: Right;   TOTAL HIP ARTHROPLASTY Right 08/30/2020   Procedure: Right hip heterotopic ossification removal;  Surgeon: Kennedy Bucker, MD;  Location: [facility] ORS;  Service: Orthopedics;  Laterality: Right;   TUBAL LIGATION      Family History  Problem Relation Age of Onset   Diabetes Mother    Hypertension Mother    Heart disease Mother    Hyperlipidemia Mother    Heart disease Father    Diabetes Sister    Heart disease Sister    Hypertension Sister    Hyperlipidemia Sister    Breast cancer Sister 20   Cancer Sister 41       breast   Breast cancer Maternal Aunt    Diabetes Sister    Stroke Sister    Diabetes Sister    Diabetes Sister     Social History   Tobacco Use   Smoking status: Former    Current packs/day: 0.00    Types: Cigarettes    Start date: 03/03/1958    Quit date: 1980    Years since quitting: 44.9   Smokeless tobacco: Never  Substance Use Topics   Alcohol use: Yes    Alcohol/week: 0.0 standard drinks of alcohol    Comment: socially     Current Outpatient Medications:    aspirin EC 81 MG tablet, Take 1 tablet (81 mg total) by mouth every evening. Swallow whole., Disp: 90 tablet, Rfl: 3   clindamycin (CLEOCIN) 150 MG capsule, Take 150 mg by mouth as needed (Take 4 caps 1 hour prior to appt.)., Disp: , Rfl:    clotrimazole-betamethasone (LOTRISONE) cream, Apply 1 Application topically daily., Disp: 45 g, Rfl: 0   mometasone (ELOCON) 0.1 % lotion, Apply 1 each topically daily. For ear pruritus, Disp: 60 mL,  Rfl: 0   olmesartan-hydrochlorothiazide (BENICAR HCT) 40-25 MG tablet, Take 1 tablet by mouth daily., Disp: 90 tablet, Rfl: 1   Polyethyl Glycol-Propyl Glycol (SYSTANE) 0.4-0.3 % SOLN, Place 1 drop into both eyes at bedtime., Disp: , Rfl:    rosuvastatin (CRESTOR) 20 MG tablet, Take 1 tablet (20 mg total) by mouth daily., Disp: 90 tablet, Rfl: 1   amLODipine (NORVASC) 2.5 MG tablet, Take 1 tablet (2.5 mg total) by mouth daily. (Patient not taking: Reported on 02/11/2023), Disp: 90 tablet, Rfl: 0  Allergies  Allergen Reactions   Tramadol Nausea And Vomiting   Doxycycline Itching   Penicillins Hives and Rash    leg lesions and weakness (couldn't walk)  Did it involve swelling of the face/tongue/throat, SOB, or low BP? No Did it involve sudden or severe rash/hives, skin peeling, or any reaction on the inside of your mouth or nose?  No Did you need to seek medical attention at a hospital or doctor's office? Yes When did it last happen?      38 years If all above answers are "NO", may proceed with cephalosporin use.    I personally reviewed active problem list, medication list, allergies, family history with the patient/caregiver today.   ROS  Ten systems reviewed and is negative except as mentioned in HPI    Objective  Vitals:   02/11/23 1005  BP: (!) 142/84  Pulse: 86  Resp: 16  Temp: (!) 97.5 F (36.4 C)  TempSrc: Oral  SpO2: 98%  Weight: 204 lb 14.4 oz (92.9 kg)  Height: 5\' 3"  (1.6 m)    Body mass index is 36.3 kg/m.  Physical Exam  Constitutional: Patient appears well-developed and well-nourished. Obese No distress.  HEENT: head atraumatic, normocephalic, pupils equal and reactive to light, neck supple Cardiovascular: Normal rate, regular rhythm and normal heart sounds.  No murmur heard. Trace BLE edema, right slightly more than left side . Pulmonary/Chest: Effort normal and breath sounds normal. No respiratory distress. Abdominal: Soft.  There is no  tenderness. Psychiatric: Patient has a normal mood and affect. behavior is normal. Judgment and thought content normal.   PHQ2/9:    02/11/2023    9:56 AM 12/18/2022   10:10 AM 10/10/2022    9:53 AM 04/10/2022   10:07 AM 03/25/2022    8:48 AM  Depression screen PHQ 2/9  Decreased Interest 0 0 0 0 0  Down, Depressed, Hopeless 0 0 0 0 0  PHQ - 2 Score 0 0 0 0 0  Altered sleeping 0 0 0 0 0  Tired, decreased energy 0 0 0 0 0  Change in appetite 0 0 0 0 0  Feeling bad or failure about yourself  0 0 0 0 0  Trouble concentrating 0 0 0 0 0  Moving slowly or fidgety/restless 0 0 0 0 0  Suicidal thoughts 0 0 0 0 0  PHQ-9 Score 0 0 0 0 0  Difficult doing work/chores Not difficult at all    Not difficult at all    phq 9 is negative   Fall Risk:    02/11/2023    9:56 AM 12/18/2022   10:10 AM 10/10/2022    9:53 AM 04/10/2022   10:06 AM 03/25/2022    8:48 AM  Fall Risk   Falls in the past year? 0 0 0 1 0  Number falls in past yr: 0  0  0  Injury with Fall? 0  0  0  Risk for fall due to : No Fall Risks No Fall Risks No Fall Risks History of fall(s) No Fall Risks  Follow up Falls prevention discussed;Education provided;Falls evaluation completed Falls prevention discussed;Education provided;Falls evaluation completed Falls prevention discussed Falls prevention discussed;Education provided;Falls evaluation completed Falls prevention discussed;Education provided;Falls evaluation completed    Assessment & Plan     Hypertension Blood pressure slightly elevated at 142/84, possibly due to recent surgery and associated pain. Patient has been off amlodipine for approximately three weeks. -Resume amlodipine 2.5mg  daily. -Change olmesartan/HCTZ to 40/12.5mg  daily from 40/25 mg dose -Check blood pressure in 4 months.  Hyperlipidemia Well controlled on rosuvastatin. -Continue rosuvastatin.  Post-operative status Recent right total knee replacement on January 06, 2023. Currently in physical therapy  with improving range of motion. -Continue physical therapy. -Encourage resumption of water aerobics and senior circuit at the [facility] when cleared by physical therapist.  Morbid Obesity BMI 36.3  with co-morbidities such as HTN, hyperlipidemia, OSA. Patient was previously active with water aerobics and senior circuit at the [facility]. -Encourage resumption of physical activity post-recovery from surgery. -Advise on protein intake for muscle mass preservation and weight management.  Restless Leg Syndrome No current symptoms reported. -No changes to management.  General Health Maintenance -Complete COVID-19 booster when appropriate. -Check A1C, cholesterol, anemia panel, and comprehensive panel today. -Follow-up in 4 months.

## 2023-02-05 DIAGNOSIS — M25561 Pain in right knee: Secondary | ICD-10-CM | POA: Diagnosis not present

## 2023-02-10 DIAGNOSIS — M25561 Pain in right knee: Secondary | ICD-10-CM | POA: Diagnosis not present

## 2023-02-11 ENCOUNTER — Encounter: Payer: Self-pay | Admitting: Family Medicine

## 2023-02-11 ENCOUNTER — Ambulatory Visit (INDEPENDENT_AMBULATORY_CARE_PROVIDER_SITE_OTHER): Payer: Medicare Other | Admitting: Family Medicine

## 2023-02-11 DIAGNOSIS — G4733 Obstructive sleep apnea (adult) (pediatric): Secondary | ICD-10-CM | POA: Diagnosis not present

## 2023-02-11 DIAGNOSIS — E559 Vitamin D deficiency, unspecified: Secondary | ICD-10-CM | POA: Insufficient documentation

## 2023-02-11 DIAGNOSIS — Z96651 Presence of right artificial knee joint: Secondary | ICD-10-CM | POA: Diagnosis not present

## 2023-02-11 DIAGNOSIS — I10 Essential (primary) hypertension: Secondary | ICD-10-CM | POA: Diagnosis not present

## 2023-02-11 DIAGNOSIS — R739 Hyperglycemia, unspecified: Secondary | ICD-10-CM

## 2023-02-11 DIAGNOSIS — E785 Hyperlipidemia, unspecified: Secondary | ICD-10-CM

## 2023-02-11 MED ORDER — ROSUVASTATIN CALCIUM 20 MG PO TABS: 20.0000 mg | ORAL_TABLET | Freq: Every day | ORAL | 1 refills | Status: DC

## 2023-02-11 MED ORDER — OLMESARTAN MEDOXOMIL-HCTZ 40-12.5 MG PO TABS: 1.0000 | ORAL_TABLET | Freq: Every day | ORAL | 1 refills | Status: DC

## 2023-02-11 MED ORDER — AMLODIPINE BESYLATE 2.5 MG PO TABS
2.5000 mg | ORAL_TABLET | Freq: Every day | ORAL | 1 refills | Status: DC
Start: 2023-02-11 — End: 2023-06-12

## 2023-02-12 DIAGNOSIS — M25561 Pain in right knee: Secondary | ICD-10-CM | POA: Diagnosis not present

## 2023-02-12 LAB — CBC WITH DIFFERENTIAL/PLATELET
Absolute Lymphocytes: 2402 {cells}/uL (ref 850–3900)
Absolute Monocytes: 479 {cells}/uL (ref 200–950)
Basophils Absolute: 71 {cells}/uL (ref 0–200)
Basophils Relative: 1.4 %
Eosinophils Absolute: 82 {cells}/uL (ref 15–500)
Eosinophils Relative: 1.6 %
HCT: 37.4 % (ref 35.0–45.0)
Hemoglobin: 12 g/dL (ref 11.7–15.5)
MCH: 27.7 pg (ref 27.0–33.0)
MCHC: 32.1 g/dL (ref 32.0–36.0)
MCV: 86.4 fL (ref 80.0–100.0)
MPV: 10.8 fL (ref 7.5–12.5)
Monocytes Relative: 9.4 %
Neutro Abs: 2066 {cells}/uL (ref 1500–7800)
Neutrophils Relative %: 40.5 %
Platelets: 390 10*3/uL (ref 140–400)
RBC: 4.33 10*6/uL (ref 3.80–5.10)
RDW: 14 % (ref 11.0–15.0)
Total Lymphocyte: 47.1 %
WBC: 5.1 10*3/uL (ref 3.8–10.8)

## 2023-02-12 LAB — COMPLETE METABOLIC PANEL WITH GFR
AG Ratio: 1.5 (calc) (ref 1.0–2.5)
ALT: 10 U/L (ref 6–29)
AST: 16 U/L (ref 10–35)
Albumin: 4.6 g/dL (ref 3.6–5.1)
Alkaline phosphatase (APISO): 79 U/L (ref 37–153)
BUN: 16 mg/dL (ref 7–25)
CO2: 32 mmol/L (ref 20–32)
Calcium: 10.4 mg/dL (ref 8.6–10.4)
Chloride: 102 mmol/L (ref 98–110)
Creat: 0.99 mg/dL (ref 0.60–1.00)
Globulin: 3 g/dL (ref 1.9–3.7)
Glucose, Bld: 118 mg/dL — ABNORMAL HIGH (ref 65–99)
Potassium: 4.1 mmol/L (ref 3.5–5.3)
Sodium: 143 mmol/L (ref 135–146)
Total Bilirubin: 0.5 mg/dL (ref 0.2–1.2)
Total Protein: 7.6 g/dL (ref 6.1–8.1)
eGFR: 59 mL/min/{1.73_m2} — ABNORMAL LOW (ref >=60)

## 2023-02-12 LAB — LIPID PANEL
Cholesterol: 163 mg/dL (ref <200)
HDL: 60 mg/dL (ref >=50)
LDL Cholesterol (Calc): 82 mg/dL
Non-HDL Cholesterol (Calc): 103 mg/dL (ref <130)
Total CHOL/HDL Ratio: 2.7 (calc) (ref <5.0)
Triglycerides: 111 mg/dL (ref <150)

## 2023-02-12 LAB — HEMOGLOBIN A1C
Hgb A1c MFr Bld: 5 %{Hb} (ref <5.7)
Mean Plasma Glucose: 97 mg/dL
eAG (mmol/L): 5.4 mmol/L

## 2023-02-13 ENCOUNTER — Ambulatory Visit: Payer: Self-pay | Admitting: *Deleted

## 2023-02-13 NOTE — Patient Outreach (Signed)
  Care Coordination   Follow Up Visit Note   02/13/2023 Name: Katosha Banaga MRN: [id number] DOB: 1945/06/05  Amanoa Caufield is a 77 y.o. year old female who sees [name], MD for primary care. I spoke with  [name] by phone today.  What matters to the patients health and wellness today?  Blood pressure elevated during last office visit, working to improve.     Goals Addressed             This Visit's Progress    COMPLETED: RNCM: Effective Management of Pain   On track    Care Coordination Interventions: Reviewed provider established plan for pain management. Discussed importance of adherence to all scheduled medical appointments. Counseled on the importance of reporting any/all new or changed pain symptoms or management strategies to pain management provider Advised patient to report to care team affect of pain on daily activities Discussed use of relaxation techniques and/or diversional activities to assist with pain reduction (distraction, imagery, relaxation, massage, acupressure, TENS, heat, and cold application Reviewed with patient prescribed pharmacological and nonpharmacological pain relief strategies Advised patient to discuss unresolved pain, or changes in level or intensity of pain with provider The patient agrees to work with the [facility] and agrees to regular outreaches. Review of the care coordination program and how the program works. The patient knows how to get in touch with the [facility]        SDOH assessments and interventions completed:  No     Care Coordination Interventions:  Yes, provided   Interventions Today    Flowsheet Row Most Recent Value  Chronic Disease   Chronic disease during today's visit Other, Hypertension (HTN)  [still recovering from hip surgery]  General Interventions   General Interventions Discussed/Reviewed General Interventions Reviewed, Doctor Visits, Durable Medical Equipment (DME)  Doctor Visits Discussed/Reviewed Doctor  Visits Reviewed, PCP, Specialist  [seen by PCP on 12/11, follow up needed for 4 months, will call to schedule]  Durable Medical Equipment (DME) BP Cuff  PCP/Specialist Visits Compliance with follow-up visit  Exercise Interventions   Exercise Discussed/Reviewed Physical Activity  Physical Activity Discussed/Reviewed Physical Activity Reviewed  [Now active with Outpatient PT]  Education Interventions   Education Provided Provided Education  Provided Verbal Education On Nutrition, Medication, When to see the doctor  [blood pressure slightly elevated, reminded to monitor daily.  Restarted on Amlodipine and Benicar increased]       Follow up plan: Follow up call scheduled for 1/17    Encounter Outcome:  Patient Visit Completed   Rodney Langton, RN, MSN, CCM [place]  [facility], [facility] RN Care Coordinator Direct Dial: [phone] / Main [phone] Fax [phone] Email: [email or web address]: [email or web address]

## 2023-02-16 DIAGNOSIS — M25561 Pain in right knee: Secondary | ICD-10-CM | POA: Diagnosis not present

## 2023-02-18 DIAGNOSIS — M25561 Pain in right knee: Secondary | ICD-10-CM | POA: Diagnosis not present

## 2023-02-24 DIAGNOSIS — M25561 Pain in right knee: Secondary | ICD-10-CM | POA: Diagnosis not present

## 2023-02-27 DIAGNOSIS — M25561 Pain in right knee: Secondary | ICD-10-CM | POA: Diagnosis not present

## 2023-03-02 DIAGNOSIS — M25561 Pain in right knee: Secondary | ICD-10-CM | POA: Diagnosis not present

## 2023-03-05 DIAGNOSIS — M25561 Pain in right knee: Secondary | ICD-10-CM | POA: Diagnosis not present

## 2023-03-10 DIAGNOSIS — M25561 Pain in right knee: Secondary | ICD-10-CM | POA: Diagnosis not present

## 2023-03-12 DIAGNOSIS — M25561 Pain in right knee: Secondary | ICD-10-CM | POA: Diagnosis not present

## 2023-03-17 DIAGNOSIS — M25561 Pain in right knee: Secondary | ICD-10-CM | POA: Diagnosis not present

## 2023-03-19 DIAGNOSIS — M25561 Pain in right knee: Secondary | ICD-10-CM | POA: Diagnosis not present

## 2023-03-20 ENCOUNTER — Ambulatory Visit: Payer: Self-pay | Admitting: *Deleted

## 2023-03-20 DIAGNOSIS — M24661 Ankylosis, right knee: Secondary | ICD-10-CM | POA: Insufficient documentation

## 2023-03-20 NOTE — Patient Instructions (Signed)
Visit Information  Thank you for taking time to visit with me today. Please don't hesitate to contact me if I can be of assistance to you before our next scheduled telephone appointment.  Following are the goals we discussed today:  Continue with outpatient PT sessions.   Continue monitoring blood pressure on a regular basis.   Our next appointment is by telephone on 2/3  Please call the care guide team at [phone] if you need to cancel or reschedule your appointment.   Please call the Suicide and Crisis Lifeline: 988 call the [place] National Suicide Prevention Lifeline: 820-691-1455 or TTY: 406-057-4913 TTY 617-738-8195) to talk to a trained counselor call 1-800-273-TALK (toll free, 24 hour hotline) call 911 if you are experiencing a Mental Health or Behavioral Health Crisis or need someone to talk to.  Patient verbalizes understanding of instructions and care plan provided today and agrees to view in MyChart. Active MyChart status and patient understanding of how to access instructions and care plan via MyChart confirmed with patient.     The patient has been provided with contact information for the care management team and has been advised to call with any health related questions or concerns.   Rodney Langton, RN, MSN, CCM [facility], [facility] RN Care Coordinator Direct Dial: [phone] / Main [phone] Fax [phone] Email: [email or web address] Website: [email or web address]

## 2023-03-20 NOTE — Patient Outreach (Signed)
  Care Coordination   Follow Up Visit Note   03/20/2023 Name: Lisa Steele MRN: [id number] DOB: 1945/12/12  Lisa Steele is a 78 y.o. year old female who sees Lisa Cory, MD for primary care. I spoke with  Lisa Steele by phone today.  What matters to the patients health and wellness today?  Patient report she has been doing well since her surgery and BP much better.  She reports minimal pain although she was told she had scar tissue on her knee that will need further intervention.  Denies any urgent concerns, encouraged to contact this care manager with questions.    Goals Addressed             This Visit's Progress    Management of chronic medical conditions       Interventions Today    Flowsheet Row Most Recent Value  Chronic Disease   Chronic disease during today's visit Other, Hypertension (HTN)  [knee pain, post knee replacement]  General Interventions   General Interventions Discussed/Reviewed General Interventions Reviewed, Doctor Visits  [was seen by ortho today, state she has lots of scar tissue and will need knee [facility]pl[facility]]  Doctor Visits Discussed/Reviewed Doctor Visits Reviewed, Specialist  [several upcoming with outpatient PT, 1/22, 1/23, 1/28 and 1/30]  PCP/Specialist Visits Compliance with follow-up visit  Exercise Interventions   Exercise Discussed/Reviewed Physical Activity  Physical Activity Discussed/Reviewed Physical Activity Reviewed  [continues working with outpatient PT for exercise and therapy]  Education Interventions   Education Provided Provided Education  Provided Verbal Education On Medication, When to see the doctor, Other  [Report she has been taking antihypertensives as changes last month, BP is better, encouraged to keep monitoring]              SDOH assessments and interventions completed:  No     Care Coordination Interventions:  Yes, provided   Follow up plan: Follow up call scheduled  for 2/3    Encounter Outcome:  Patient Visit Completed   Rodney Langton, RN, MSN, CCM [place]  [facility], [facility] RN Care Coordinator Direct Dial: [phone] / Main [phone] Fax [phone] Email: [email or web address]: [email or web address]

## 2023-03-24 DIAGNOSIS — Z96651 Presence of right artificial knee joint: Secondary | ICD-10-CM | POA: Diagnosis not present

## 2023-03-24 DIAGNOSIS — Z96649 Presence of unspecified artificial hip joint: Secondary | ICD-10-CM | POA: Diagnosis not present

## 2023-03-24 DIAGNOSIS — M24661 Ankylosis, right knee: Secondary | ICD-10-CM | POA: Diagnosis not present

## 2023-03-25 ENCOUNTER — Encounter: Payer: Self-pay | Admitting: Emergency Medicine

## 2023-03-25 ENCOUNTER — Emergency Department: Payer: Medicare Other

## 2023-03-25 ENCOUNTER — Other Ambulatory Visit: Payer: Self-pay

## 2023-03-25 ENCOUNTER — Emergency Department
Admission: EM | Admit: 2023-03-25 | Discharge: 2023-03-25 | Disposition: A | Discharge status: Home / Self Care | Payer: Medicare Other | Attending: Emergency Medicine | Admitting: Emergency Medicine

## 2023-03-25 DIAGNOSIS — T50905A Adverse effect of unspecified drugs, medicaments and biological substances, initial encounter: Secondary | ICD-10-CM | POA: Diagnosis not present

## 2023-03-25 DIAGNOSIS — R079 Chest pain, unspecified: Secondary | ICD-10-CM | POA: Diagnosis not present

## 2023-03-25 DIAGNOSIS — R110 Nausea: Secondary | ICD-10-CM | POA: Diagnosis not present

## 2023-03-25 DIAGNOSIS — R0789 Other chest pain: Secondary | ICD-10-CM | POA: Diagnosis not present

## 2023-03-25 DIAGNOSIS — R0602 Shortness of breath: Secondary | ICD-10-CM | POA: Diagnosis not present

## 2023-03-25 DIAGNOSIS — T887XXA Unspecified adverse effect of drug or medicament, initial encounter: Secondary | ICD-10-CM | POA: Diagnosis not present

## 2023-03-25 DIAGNOSIS — M7989 Other specified soft tissue disorders: Secondary | ICD-10-CM | POA: Diagnosis not present

## 2023-03-25 LAB — CBC
HCT: 39.6 % (ref 36.0–46.0)
Hemoglobin: 13 g/dL (ref 12.0–15.0)
MCH: 27.8 pg (ref 26.0–34.0)
MCHC: 32.8 g/dL (ref 30.0–36.0)
MCV: 84.6 fL (ref 80.0–100.0)
Platelets: 373 10*3/uL (ref 150–400)
RBC: 4.68 MIL/uL (ref 3.87–5.11)
RDW: 15.6 % — ABNORMAL HIGH (ref 11.5–15.5)
WBC: 10.1 10*3/uL (ref 4.0–10.5)
nRBC: 0 % (ref 0.0–0.2)

## 2023-03-25 LAB — BASIC METABOLIC PANEL
Anion gap: 9 (ref 5–15)
BUN: 17 mg/dL (ref 8–23)
CO2: 28 mmol/L (ref 22–32)
Calcium: 9.9 mg/dL (ref 8.9–10.3)
Chloride: 100 mmol/L (ref 98–111)
Creatinine, Ser: 0.98 mg/dL (ref 0.44–1.00)
GFR, Estimated: 60 mL/min — ABNORMAL LOW (ref >60)
Glucose, Bld: 157 mg/dL — ABNORMAL HIGH (ref 70–99)
Potassium: 4.1 mmol/L (ref 3.5–5.1)
Sodium: 137 mmol/L (ref 135–145)

## 2023-03-25 LAB — TROPONIN I (HIGH SENSITIVITY): Troponin I (High Sensitivity): 7 ng/L (ref <18)

## 2023-03-25 MED ORDER — ONDANSETRON 4 MG PO TBDP
4.0000 mg | ORAL_TABLET | Freq: Once | ORAL | Status: AC
  Administered 2023-03-25: 4 mg via ORAL
  Filled 2023-03-25: qty 1

## 2023-03-25 MED ORDER — SODIUM CHLORIDE 0.9 % IV SOLN: Freq: Once | INTRAVENOUS | Status: AC

## 2023-03-25 NOTE — ED Provider Triage Note (Signed)
Emergency Medicine Provider Triage Evaluation Note  Meradith Beechy , a 78 y.o. female  was evaluated in triage.  Pt complains of sudden central CP with no radiation and SOB. onset @ 1300 that is constant. States "it feels like I need to throw up". No recent travel. Patient had knee replacement in November and a non-invasive knee manipulation performed yesterday at [facility].    Review of Systems  Positive: Headache  Negative:   Physical Exam  BP (!) 154/68 (BP Location: Right Arm)   Pulse (!) 51   Resp 18   Ht 5\' 3"  (1.6 m)   Wt 92.1 kg   SpO2 100%   BMI 35.96 kg/m  Gen:   Awake, no distress   Resp:  Normal effort  MSK:   Moves extremities without difficulty  Other:    Medical Decision Making  Medically screening exam initiated at 3:37 PM.  Appropriate orders placed.  Delorse Lingen was informed that the remainder of the evaluation will be completed by another provider, this initial triage assessment does not replace that evaluation, and the importance of remaining in the ED until their evaluation is complete.     Romeo Apple, Suzzane Quilter A, PA-C 03/25/23 [phone]

## 2023-03-25 NOTE — ED Triage Notes (Signed)
Patient to ED via POV for generalized CP and SOB. Started 1300 before started physical therapy. Denies cardiac hx. Also having headache and nausea. Started oxycodone and Zofran yesterday after a knee manipulation yesterday at [facility].

## 2023-03-25 NOTE — ED Notes (Signed)
..  The patient is A&OX4, ambulatory at d/c with independent steady gait, NAD. Pt verbalized understanding of d/c instructions and follow up care.

## 2023-03-25 NOTE — ED Provider Notes (Signed)
[facility] Provider Note    Event Date/Time   First MD Initiated Contact with Patient 03/25/23 1925     (approximate)   History   Chest Pain   HPI  Lisa Steele is a 78 y.o. female who presents after episode of chest discomfort which happened earlier today which was resolved by vomiting.  She did feel lightheaded however and was concerned.  Yesterday she was put under anesthesia for knee manipulation status post TKR on the right which needed further extension.  She did take a Percocet yesterday and this morning as well as Zofran.  She suspects these medications may have had something to do with it.     Physical Exam   Triage Vital Signs: ED Triage Vitals  Encounter Vitals Group     BP 03/25/23 1536 (!) 154/68     Systolic BP Percentile --      Diastolic BP Percentile --      Pulse Rate 03/25/23 1536 (!) 51     Resp 03/25/23 1536 18     Temp 03/25/23 1536 97.6 F (36.4 C)     Temp Source 03/25/23 1536 Oral     SpO2 03/25/23 1536 100 %     Weight 03/25/23 1534 92.1 kg (203 lb)     Height 03/25/23 1534 1.6 m (5\' 3" )     Head Circumference --      Peak Flow --      Pain Score 03/25/23 1533 8     Pain Loc --      Pain Education --      Exclude from Growth Chart --     Most recent vital signs: Vitals:   03/25/23 1536 03/25/23 2102  BP: (!) 154/68 (!) 163/62  Pulse: (!) 51 70  Resp: 18 20  Temp: 97.6 F (36.4 C) 97.7 F (36.5 C)  SpO2: 100% 100%     General: Awake, no distress.  CV:  Good peripheral perfusion.  Resp:  Normal effort.  Abd:  No distention.  Other:     ED Results / Procedures / Treatments   Labs (all labs ordered are listed, but only abnormal results are displayed) Labs Reviewed  BASIC METABOLIC PANEL - Abnormal; Notable for the following components:      Result Value   Glucose, Bld 157 (*)    GFR, Estimated 60 (*)    All other components within normal limits  CBC - Abnormal; Notable for the following  components:   RDW 15.6 (*)    All other components within normal limits  TROPONIN I (HIGH SENSITIVITY)     EKG  ED ECG REPORT I, Jene Every, the attending physician, personally viewed and interpreted this ECG.  Date: 03/25/2023  Rhythm: normal sinus rhythm QRS Axis: normal Intervals: normal ST/T Wave abnormalities: normal Narrative Interpretation: no evidence of acute ischemia    RADIOLOGY Chest x-ray viewed interpret by me, no acute abnormality Ultrasound negative for DVT    PROCEDURES:  Critical Care performed:   Procedures   MEDICATIONS ORDERED IN ED: Medications  ondansetron (ZOFRAN-ODT) disintegrating tablet 4 mg (4 mg Oral Given 03/25/23 1538)  0.9 %  sodium chloride infusion ( Intravenous New Bag/Given 03/25/23 2106)     IMPRESSION / MDM / ASSESSMENT AND PLAN / ED COURSE  I reviewed the triage vital signs and the nursing notes. Patient's presentation is most consistent with acute presentation with potential threat to life or bodily function.  Patient presents with complaints of chest  discomfort as above.  Doubt ACS, EKG EKG is reassuring.  No cough fevers or chills to suggest pneumonia.  Symptoms have resolved after vomiting, suggest gastritis/esophagitis as the cause.  Particular given that she had anesthesia and Percocet today which can cause nausea as well as dizziness  Describes some swelling in her right lower leg which has been present since the surgery, will send for ultrasound to rule out DVT.  No pleurisy or tachycardia  IV fluids ordered for dizziness  ----------------------------------------- 10:25 PM on 03/25/2023 ----------------------------------------- Discussed with patient ultrasound results blood tests she is feeling much better, she was able to ambulate to the bathroom without dizziness, she feels comfortable with discharge at this time and will return if any worsening        FINAL CLINICAL IMPRESSION(S) / ED DIAGNOSES   Final  diagnoses:  Atypical chest pain  Nausea  Medication side effect, initial encounter     Rx / DC Orders   ED Discharge Orders     None        Note:  This document was prepared using Dragon voice recognition software and may include unintentional dictation errors.   Jene Every, MD 03/25/23 2225

## 2023-03-25 NOTE — ED Notes (Signed)
US at bedside

## 2023-03-26 DIAGNOSIS — M25561 Pain in right knee: Secondary | ICD-10-CM | POA: Diagnosis not present

## 2023-03-27 DIAGNOSIS — M25561 Pain in right knee: Secondary | ICD-10-CM | POA: Diagnosis not present

## 2023-03-30 ENCOUNTER — Encounter: Payer: Self-pay | Admitting: Family Medicine

## 2023-03-30 ENCOUNTER — Ambulatory Visit (INDEPENDENT_AMBULATORY_CARE_PROVIDER_SITE_OTHER): Payer: Medicare Other | Admitting: Family Medicine

## 2023-03-30 VITALS — BP 128/68 | HR 79 | Temp 97.4°F | Resp 16 | Ht 63.0 in | Wt 202.2 lb

## 2023-03-30 DIAGNOSIS — K5909 Other constipation: Secondary | ICD-10-CM

## 2023-03-30 DIAGNOSIS — R0789 Other chest pain: Secondary | ICD-10-CM

## 2023-03-30 DIAGNOSIS — R600 Localized edema: Secondary | ICD-10-CM | POA: Diagnosis not present

## 2023-03-30 DIAGNOSIS — Z789 Other specified health status: Secondary | ICD-10-CM | POA: Diagnosis not present

## 2023-03-30 MED ORDER — POLYETHYLENE GLYCOL 3350 17 GM/SCOOP PO POWD: 17.0000 g | Freq: Two times a day (BID) | ORAL | 0 refills | Status: DC | PRN

## 2023-03-30 NOTE — Progress Notes (Signed)
Name: Lisa Steele   MRN: [id number]    DOB: Feb 19, 1946   Date:03/30/2023       Progress Note  Subjective  Chief Complaint  Chief Complaint  Patient presents with   Chest Pain    Following up from ER- pt states feels much better no more chest pains since that day   HPI   Discussed the use of AI scribe software for clinical note transcription with the patient, who gave verbal consent to proceed.  History of Present Illness   The patient, with a recent history of right knee manipulation, presented with an adverse reaction to oxycodone taken for post-procedure pain. The reaction occurred on the evening of the procedure and was characterized by severe chest pain and a throbbing headache, which was described as a sensation of the head 'about to pop open.' The pain was located across the front of the head and was severe enough to prompt an emergency room visit. The patient also reported nausea and vomiting, which was alleviated after administration of Zofran in the ER. The patient has since discontinued the oxycodone and has not experienced any further episodes of chest pain or headaches.  The patient also reported persistent constipation since her knee surgery in November. The constipation has been described as uncomfortable, with bowel movements occurring infrequently and in small amounts. The patient has tried Colace and herbal tea for relief, but these have not been effective.  Additionally, the patient reported swelling around the ankles, more pronounced on the left side, despite the right knee being the one recently manipulated. The patient also reported soreness in the calves, both at rest and during walking. Doppler US done at [facility] last week was negative for DVT  The patient's knee mobility has improved since the manipulation, with a range of motion increase from 80 to 94 degrees. The patient is continuing physical therapy until the end of February.          Patient Active Problem List    Diagnosis Date Noted   History of total right knee replacement (TKR) 02/11/2023   Vitamin D deficiency 02/11/2023   Morbid obesity (HCC) 04/05/2021   OSA on CPAP 03/21/2021   Postoperative heterotopic ossification 08/30/2020   Status post total hip replacement, right 06/16/2019   Trigger finger of left thumb 03/04/2018   S/P rotator cuff repair 01/12/2018   Injury of left rotator cuff 11/03/2017   Dysmetabolic syndrome 06/22/2014   Post-menopausal 06/22/2014   H/O cold sores 06/22/2014   Dyslipidemia 06/22/2014   Benign essential HTN 06/22/2014   Abnormal ECG 06/22/2014    Past Surgical History:  Procedure Laterality Date   APPENDECTOMY     BREAST EXCISIONAL BIOPSY Left 1970   neg   BUNIONECTOMY Left 2008   CATARACT EXTRACTION, BILATERAL  10/2013   COLONOSCOPY     2005, 2009, 2013   COLONOSCOPY WITH PROPOFOL N/A 06/25/2016   Procedure: COLONOSCOPY WITH PROPOFOL;  Surgeon: Scot Jun, MD;  Location: [facility] ENDOSCOPY;  Service: Endoscopy;  Laterality: N/A;   COLONOSCOPY WITH PROPOFOL N/A 04/11/2021   Procedure: COLONOSCOPY WITH PROPOFOL;  Surgeon: Wyline Mood, MD;  Location: [facility] ENDOSCOPY;  Service: Gastroenterology;  Laterality: N/A;   ECTOPIC PREGNANCY SURGERY  1978   REPLACEMENT TOTAL KNEE Right 01/06/2023   Dr. Novella Olive   SHOULDER ARTHROSCOPY WITH ROTATOR CUFF REPAIR AND SUBACROMIAL DECOMPRESSION Left 11/25/2017   Procedure: SHOULDER ARTHROSCOPic release of long head biceps tendon and mini open rotator cuff repair;  Surgeon: Erin Sons, MD;  Location:  [facility] ORS;  Service: Orthopedics;  Laterality: Left;   TONSILECTOMY, ADENOIDECTOMY, BILATERAL MYRINGOTOMY AND TUBES     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   Procedure: TOTAL HIP ARTHROPLASTY ANTERIOR APPROACH;  Surgeon: Kennedy Bucker, MD;  Location: [facility] ORS;  Service: Orthopedics;  Laterality: Right;   TOTAL HIP ARTHROPLASTY Right 08/30/2020   Procedure: Right hip heterotopic ossification removal;  Surgeon:  Kennedy Bucker, MD;  Location: [facility] ORS;  Service: Orthopedics;  Laterality: Right;   TUBAL LIGATION      Family History  Problem Relation Age of Onset   Diabetes Mother    Hypertension Mother    Heart disease Mother    Hyperlipidemia Mother    Heart disease Father    Diabetes Sister    Heart disease Sister    Hypertension Sister    Hyperlipidemia Sister    Breast cancer Sister 67   Cancer Sister 21       breast   Breast cancer Maternal Aunt    Diabetes Sister    Stroke Sister    Diabetes Sister    Diabetes Sister     Social History   Tobacco Use   Smoking status: Former    Current packs/day: 0.00    Types: Cigarettes    Start date: 03/03/1958    Quit date: 1980    Years since quitting: 45.1   Smokeless tobacco: Never  Substance Use Topics   Alcohol use: Yes    Alcohol/week: 0.0 standard drinks of alcohol    Comment: socially     Current Outpatient Medications:    amLODipine (NORVASC) 2.5 MG tablet, Take 1 tablet (2.5 mg total) by mouth daily., Disp: 90 tablet, Rfl: 1   aspirin EC 81 MG tablet, Take 1 tablet (81 mg total) by mouth every evening. Swallow whole., Disp: 90 tablet, Rfl: 3   clindamycin (CLEOCIN) 150 MG capsule, Take 150 mg by mouth as needed (Take 4 caps 1 hour prior to appt.)., Disp: , Rfl:    clotrimazole-betamethasone (LOTRISONE) cream, Apply 1 Application topically daily., Disp: 45 g, Rfl: 0   mometasone (ELOCON) 0.1 % lotion, Apply 1 each topically daily. For ear pruritus, Disp: 60 mL, Rfl: 0   olmesartan-hydrochlorothiazide (BENICAR HCT) 40-12.5 MG tablet, Take 1 tablet by mouth daily., Disp: 90 tablet, Rfl: 1   Polyethyl Glycol-Propyl Glycol (SYSTANE) 0.4-0.3 % SOLN, Place 1 drop into both eyes at bedtime., Disp: , Rfl:    rosuvastatin (CRESTOR) 20 MG tablet, Take 1 tablet (20 mg total) by mouth daily., Disp: 90 tablet, Rfl: 1   MOBIC 7.5 MG tablet, Take 1 tablet twice a day by oral route. (Patient not taking: Reported on 03/30/2023), Disp: , Rfl:    Allergies  Allergen Reactions   Tramadol Nausea And Vomiting   Doxycycline Itching   Penicillins Hives and Rash    leg lesions and weakness (couldn't walk)  Did it involve swelling of the face/tongue/throat, SOB, or low BP? No Did it involve sudden or severe rash/hives, skin peeling, or any reaction on the inside of your mouth or nose? No Did you need to seek medical attention at a hospital or doctor's office? Yes When did it last happen?      38 years If all above answers are "NO", may proceed with cephalosporin use.    I personally reviewed active problem list, medication list, allergies with the patient/caregiver today.   ROS  Ten systems reviewed and is negative except as mentioned in HPI    Objective  Vitals:  03/30/23 1341  BP: 128/68  Pulse: 79  Resp: 16  Temp: (!) 97.4 F (36.3 C)  TempSrc: Oral  Weight: 202 lb 3.2 oz (91.7 kg)  Height: 5\' 3"  (1.6 m)    Body mass index is 35.82 kg/m.  Physical Exam  Constitutional: Patient appears well-developed and well-nourished. Obese  No distress.  HEENT: head atraumatic, normocephalic, pupils equal and reactive to light, neck supple Cardiovascular: Normal rate, regular rhythm and normal heart sounds.  No murmur heard. No BLE edema. Pulmonary/Chest: Effort normal and breath sounds normal. No respiratory distress. Abdominal: Soft.  There is no tenderness. Psychiatric: Patient has a normal mood and affect. behavior is normal. Judgment and thought content normal.   Recent Results (from the past 2160 hours)  COMPLETE METABOLIC PANEL WITH GFR     Status: Abnormal   Collection Time: 02/11/23 10:51 AM  Result Value Ref Range   Glucose, Bld 118 (H) 65 - 99 mg/dL    Comment: .            Fasting reference interval . For someone without known diabetes, a glucose value between 100 and 125 mg/dL is consistent with prediabetes and should be confirmed with a follow-up test. .    BUN 16 7 - 25 mg/dL   Creat 1.91 4.78 -  2.95 mg/dL   eGFR 59 (L) > OR = 60 mL/min/1.15m2   BUN/Creatinine Ratio SEE NOTE: 6 - 22 (calc)    Comment:    Not Reported: BUN and Creatinine are within    reference range. .    Sodium 143 135 - 146 mmol/L   Potassium 4.1 3.5 - 5.3 mmol/L   Chloride 102 98 - 110 mmol/L   CO2 32 20 - 32 mmol/L   Calcium 10.4 8.6 - 10.4 mg/dL   Total Protein 7.6 6.1 - 8.1 g/dL   Albumin 4.6 3.6 - 5.1 g/dL   Globulin 3.0 1.9 - 3.7 g/dL (calc)   AG Ratio 1.5 1.0 - 2.5 (calc)   Total Bilirubin 0.5 0.2 - 1.2 mg/dL   Alkaline phosphatase (APISO) 79 37 - 153 U/L   AST 16 10 - 35 U/L   ALT 10 6 - 29 U/L  CBC with Differential/Platelet     Status: None   Collection Time: 02/11/23 10:51 AM  Result Value Ref Range   WBC 5.1 3.8 - 10.8 Thousand/uL   RBC 4.33 3.80 - 5.10 Million/uL   Hemoglobin 12.0 11.7 - 15.5 g/dL   HCT 62.1 30.8 - 65.7 %   MCV 86.4 80.0 - 100.0 fL   MCH 27.7 27.0 - 33.0 pg   MCHC 32.1 32.0 - 36.0 g/dL    Comment: For adults, a slight decrease in the calculated MCHC value (in the range of 30 to 32 g/dL) is most likely not clinically significant; however, it should be interpreted with caution in correlation with other red cell parameters and the patient's clinical condition.    RDW 14.0 11.0 - 15.0 %   Platelets 390 140 - 400 Thousand/uL   MPV 10.8 7.5 - 12.5 fL   Neutro Abs 2,066 1,500 - 7,800 cells/uL   Absolute Lymphocytes 2,402 850 - 3,900 cells/uL   Absolute Monocytes 479 200 - 950 cells/uL   Eosinophils Absolute 82 15 - 500 cells/uL   Basophils Absolute 71 0 - 200 cells/uL   Neutrophils Relative % 40.5 %   Total Lymphocyte 47.1 %   Monocytes Relative 9.4 %   Eosinophils Relative 1.6 %  Basophils Relative 1.4 %  Lipid panel     Status: None   Collection Time: 02/11/23 10:51 AM  Result Value Ref Range   Cholesterol 163 <200 mg/dL   HDL 60 > OR = 50 mg/dL   Triglycerides 161 <096 mg/dL   LDL Cholesterol (Calc) 82 mg/dL (calc)    Comment: Reference range:  <100 . Desirable range <100 mg/dL for primary prevention;   <70 mg/dL for patients with CHD or diabetic patients  with > or = 2 CHD risk factors. Marland Kitchen LDL-C is now calculated using the Martin-Hopkins  calculation, which is a validated novel method providing  better accuracy than the Friedewald equation in the  estimation of LDL-C.  Horald Pollen et al. Lenox Ahr. 0454;098(11): 2061-2068  (http://education.[facility].com/faq/FAQ164)    Total CHOL/HDL Ratio 2.7 <5.0 (calc)   Non-HDL Cholesterol (Calc) 103 <130 mg/dL (calc)    Comment: For patients with diabetes plus 1 major ASCVD risk  factor, treating to a non-HDL-C goal of <100 mg/dL  (LDL-C of <91 mg/dL) is considered a therapeutic  option.   Hemoglobin A1c     Status: None   Collection Time: 02/11/23 10:51 AM  Result Value Ref Range   Hgb A1c MFr Bld 5.0 <5.7 % of total Hgb    Comment: For the purpose of screening for the presence of diabetes: . <5.7%       Consistent with the absence of diabetes 5.7-6.4%    Consistent with increased risk for diabetes             (prediabetes) > or =6.5%  Consistent with diabetes . This assay result is consistent with a decreased risk of diabetes. . Currently, no consensus exists regarding use of hemoglobin A1c for diagnosis of diabetes in children. . According to American Diabetes Association (ADA) guidelines, hemoglobin A1c <7.0% represents optimal control in non-pregnant diabetic patients. Different metrics may apply to specific patient populations.  Standards of Medical Care in Diabetes(ADA). .    Mean Plasma Glucose 97 mg/dL   eAG (mmol/L) 5.4 mmol/L  Basic metabolic panel     Status: Abnormal   Collection Time: 03/25/23  3:37 PM  Result Value Ref Range   Sodium 137 135 - 145 mmol/L   Potassium 4.1 3.5 - 5.1 mmol/L   Chloride 100 98 - 111 mmol/L   CO2 28 22 - 32 mmol/L   Glucose, Bld 157 (H) 70 - 99 mg/dL    Comment: Glucose reference range applies only to samples taken after  fasting for at least 8 hours.   BUN 17 8 - 23 mg/dL   Creatinine, Ser 4.78 0.44 - 1.00 mg/dL   Calcium 9.9 8.9 - 29.5 mg/dL   GFR, Estimated 60 (L) >60 mL/min    Comment: (NOTE) Calculated using the CKD-EPI Creatinine Equation (2021)    Anion gap 9 5 - 15    Comment: Performed at [facility], [address] [place], [place] [address]  CBC     Status: Abnormal   Collection Time: 03/25/23  3:37 PM  Result Value Ref Range   WBC 10.1 4.0 - 10.5 K/uL   RBC 4.68 3.87 - 5.11 MIL/uL   Hemoglobin 13.0 12.0 - 15.0 g/dL   HCT 86.5 78.4 - 69.6 %   MCV 84.6 80.0 - 100.0 fL   MCH 27.8 26.0 - 34.0 pg   MCHC 32.8 30.0 - 36.0 g/dL   RDW 29.5 (H) 28.4 - 13.2 %   Platelets 373 150 - 400 K/uL  nRBC 0.0 0.0 - 0.2 %    Comment: Performed at [facility], [address] [place], [place] [address]  Troponin I (High Sensitivity)     Status: None   Collection Time: 03/25/23  3:37 PM  Result Value Ref Range   Troponin I (High Sensitivity) 7 <18 ng/L    Comment: (NOTE) Elevated high sensitivity troponin I (hsTnI) values and significant  changes across serial measurements may suggest ACS but many other  chronic and acute conditions are known to elevate hsTnI results.  Refer to the "Links" section for chest pain algorithms and additional  guidance. Performed at [facility], [address] [place], [place] [address]     Diabetic Foot Exam:     PHQ2/9:    03/30/2023    1:35 PM 02/11/2023    9:56 AM 12/18/2022   10:10 AM 10/10/2022    9:53 AM 04/10/2022   10:07 AM  Depression screen PHQ 2/9  Decreased Interest 0 0 0 0 0  Down, Depressed, Hopeless 0 0 0 0 0  PHQ - 2 Score 0 0 0 0 0  Altered sleeping 0 0 0 0 0  Tired, decreased energy 0 0 0 0 0  Change in appetite 0 0 0 0 0  Feeling bad or failure about yourself  0 0 0 0 0  Trouble concentrating 0 0 0 0 0  Moving slowly or fidgety/restless 0 0 0 0 0  Suicidal thoughts 0 0 0 0 0  PHQ-9 Score 0 0 0 0 0   Difficult doing work/chores Not difficult at all Not difficult at all       phq 9 is negative  Fall Risk:    03/30/2023    1:35 PM 02/11/2023    9:56 AM 12/18/2022   10:10 AM 10/10/2022    9:53 AM 04/10/2022   10:06 AM  Fall Risk   Falls in the past year? 0 0 0 0 1  Number falls in past yr: 0 0  0   Injury with Fall? 0 0  0   Risk for fall due to : No Fall Risks No Fall Risks No Fall Risks No Fall Risks History of fall(s)  Follow up Falls prevention discussed;Education provided;Falls evaluation completed Falls prevention discussed;Education provided;Falls evaluation completed Falls prevention discussed;Education provided;Falls evaluation completed Falls prevention discussed Falls prevention discussed;Education provided;Falls evaluation completed     Assessment & Plan      Adverse reaction to Oxycodone Developed chest pain and headache after taking Oxycodone post knee manipulation. Symptoms resolved after vomiting. Likely intolerance rather than allergy. -Discontinue Oxycodone. -Consider alternative pain management strategies if needed in the future.  History of right knee replacement  Recent manipulation with improvement in range of motion. Ongoing physical therapy until end of February. -Continue physical therapy.  Constipation New onset since knee surgery in November. Likely multifactorial due to decreased mobility and medication side effects. -Start Miralax 1 capful daily mixed with water, juice, or tea (not dairy). Adjust dose based on response. -Consider enema for immediate relief if needed.  Lower extremity edema Noted swelling around ankles, more pronounced on left side ( likely due to OA - needs replacement but not sure if she is ready to have it done) . Likely due to recent knee manipulation and decreased mobility.  -Elevate legs when sitting and perform ankle pumps to promote venous return. -Continue to monitor and report any worsening or associated  symptoms.  Follow-up in 3 months for regular check-up.

## 2023-03-31 DIAGNOSIS — M25561 Pain in right knee: Secondary | ICD-10-CM | POA: Diagnosis not present

## 2023-04-02 DIAGNOSIS — M25561 Pain in right knee: Secondary | ICD-10-CM | POA: Diagnosis not present

## 2023-04-06 DIAGNOSIS — M25561 Pain in right knee: Secondary | ICD-10-CM | POA: Diagnosis not present

## 2023-04-08 DIAGNOSIS — M25561 Pain in right knee: Secondary | ICD-10-CM | POA: Diagnosis not present

## 2023-04-10 DIAGNOSIS — M25561 Pain in right knee: Secondary | ICD-10-CM | POA: Diagnosis not present

## 2023-04-13 DIAGNOSIS — M25561 Pain in right knee: Secondary | ICD-10-CM | POA: Diagnosis not present

## 2023-04-15 DIAGNOSIS — M25561 Pain in right knee: Secondary | ICD-10-CM | POA: Diagnosis not present

## 2023-04-16 ENCOUNTER — Telehealth: Payer: Self-pay | Admitting: Family Medicine

## 2023-04-16 NOTE — Telephone Encounter (Signed)
Pt is calling to report that she received a letter to contact [facility] heart care. Pt contacted [facility] care And no referral was received. Please advise CB- [phone]

## 2023-04-16 NOTE — Telephone Encounter (Signed)
I do not see you have ordered a DVT for her besides in 2020.

## 2023-04-20 DIAGNOSIS — M25561 Pain in right knee: Secondary | ICD-10-CM | POA: Diagnosis not present

## 2023-04-22 ENCOUNTER — Telehealth: Payer: Self-pay | Admitting: *Deleted

## 2023-04-22 NOTE — Patient Outreach (Signed)
  Care Coordination   Follow Up Visit Note   04/22/2023 Name: Lisa Steele MRN: [id number] DOB: 1945-08-16  Lisa Steele is a 78 y.o. year old female who sees Lisa Cory, MD for primary care. I spoke with  Lisa Steele by phone today.  What matters to the patients health and wellness today?  Patient report she is doing well, range of motion for right knee is now improved to 105 degrees. She was referred to have DVT studies due to some pain and swelling in her left leg, will have that done within the next few weeks.  Denies any urgent concerns, encouraged to contact this care manager with questions.     Goals Addressed             This Visit's Progress    Management of chronic medical conditions   On track    Interventions Today    Flowsheet Row Most Recent Value  Chronic Disease   Chronic disease during today's visit Other, Hypertension (HTN)  [recent knee surgery]  General Interventions   General Interventions Discussed/Reviewed General Interventions Reviewed, Doctor Visits  Doctor Visits Discussed/Reviewed Doctor Visits Reviewed, PCP, Specialist  [Upcoming: ortho MD and outpatient PT next week, DVT test on 3/11, and PCP on 4/11]  PCP/Specialist Visits Compliance with follow-up visit  Exercise Interventions   Exercise Discussed/Reviewed Physical Activity  Physical Activity Discussed/Reviewed Physical Activity Reviewed  [Will continue sessions with Outpatient PT, state she may be discharged next week as she is nearing her goal]  Education Interventions   Education Provided Provided Education  Provided Verbal Education On Medication, When to see the doctor, Other  [Meds reviewed, taking as instructed.  she is also increasing exercise/activity as tolerated to reach goals of knee angles/mobility]              SDOH assessments and interventions completed:  No     Care Coordination Interventions:  Yes, provided   Follow up plan: Follow up call scheduled for 3/18     Encounter Outcome:  Patient Visit Completed   Rodney Langton, RN, MSN, CCM [place]  [facility], [facility] RN Care Coordinator Direct Dial: [phone] / Main [phone] Fax [phone] Email: [email or web address] Website: [email or web address]

## 2023-04-22 NOTE — Patient Instructions (Signed)
 Visit Information  Thank you for taking time to visit with me today. Please don't hesitate to contact me if I can be of assistance to you before our next scheduled telephone appointment.  Following are the goals we discussed today:  Read attached information regarding DVT and notify MD if you have any of these symptoms.   Our next appointment is by telephone on 3/18  Please call the care guide team at [phone] if you need to cancel or reschedule your appointment.   Please call the Suicide and Crisis Lifeline: 988 call the [place] National Suicide Prevention Lifeline: (718)296-1403 or TTY: 364-596-0932 TTY 763-871-0827) to talk to a trained counselor call 1-800-273-TALK (toll free, 24 hour hotline) call 911 if you are experiencing a Mental Health or Behavioral Health Crisis or need someone to talk to.  Patient verbalizes understanding of instructions and care plan provided today and agrees to view in MyChart. Active MyChart status and patient understanding of how to access instructions and care plan via MyChart confirmed with patient.     The patient has been provided with contact information for the care management team and has been advised to call with any health related questions or concerns.   Rodney Langton, RN, MSN, CCM [facility], [facility] RN Care Coordinator Direct Dial: [phone] / Main [phone] Fax [phone] Email: [email or web address] Website: [email or web address]

## 2023-04-27 DIAGNOSIS — M25561 Pain in right knee: Secondary | ICD-10-CM | POA: Diagnosis not present

## 2023-04-29 DIAGNOSIS — M25561 Pain in right knee: Secondary | ICD-10-CM | POA: Diagnosis not present

## 2023-05-06 DIAGNOSIS — M25561 Pain in right knee: Secondary | ICD-10-CM | POA: Diagnosis not present

## 2023-05-08 DIAGNOSIS — M25561 Pain in right knee: Secondary | ICD-10-CM | POA: Diagnosis not present

## 2023-05-08 NOTE — Telephone Encounter (Signed)
 Copied from CRM [id number]. Topic: General - Other >> May 08, 2023  9:57 AM Emylou G wrote: Reason for CRM: ashley w/[facility]  .Marland Kitchen Says patient has an appt for DVT .Marland Kitchen Would like doctor to add notes that it is okay and authorized for the patient before the appt

## 2023-05-11 ENCOUNTER — Encounter: Payer: Self-pay | Admitting: Family Medicine

## 2023-05-12 ENCOUNTER — Ambulatory Visit: Payer: Medicare Other | Attending: Family Medicine

## 2023-05-12 DIAGNOSIS — R600 Localized edema: Secondary | ICD-10-CM

## 2023-05-12 DIAGNOSIS — M79662 Pain in left lower leg: Secondary | ICD-10-CM

## 2023-05-12 NOTE — Telephone Encounter (Signed)
 Lisa Steele with [facility] calling as they have not received authorization for patient's appointment today at 1pm. Patient's appointment will have to be rescheduled if authorization not received.

## 2023-05-13 DIAGNOSIS — M25561 Pain in right knee: Secondary | ICD-10-CM | POA: Diagnosis not present

## 2023-05-14 ENCOUNTER — Encounter: Payer: Self-pay | Admitting: Family Medicine

## 2023-05-15 DIAGNOSIS — M25561 Pain in right knee: Secondary | ICD-10-CM | POA: Diagnosis not present

## 2023-05-19 ENCOUNTER — Ambulatory Visit: Payer: Self-pay | Admitting: *Deleted

## 2023-05-19 DIAGNOSIS — M25561 Pain in right knee: Secondary | ICD-10-CM | POA: Diagnosis not present

## 2023-05-19 NOTE — Patient Outreach (Signed)
 Care Coordination   Follow Up Visit Note   05/19/2023 Name: Lisa Steele MRN: [id number] DOB: 06/28/1945  Lisa Steele is a 78 y.o. year old female who sees [name], MD for primary care. I spoke with  [name] by phone today.  What matters to the patients health and wellness today?  Patient report she is doing well, continues to recover from knee surgery, rates pain at 0/10 today.  Swelling is better, DVT studies were negative. Denies any urgent concerns, encouraged to contact this care manager with questions.      Goals Addressed             This Visit's Progress    COMPLETED: Management of chronic medical conditions   On track    Interventions Today    Flowsheet Row Most Recent Value  Chronic Disease   Chronic disease during today's visit Hypertension (HTN), Other  [recovery from knee surgery]  General Interventions   General Interventions Discussed/Reviewed General Interventions Reviewed, Doctor Visits, Durable Medical Equipment (DME)  Doctor Visits Discussed/Reviewed Doctor Visits Reviewed, PCP, Specialist  [Upcoming with ortho the first week in April, PCP 4/11, Pulmonology 5/15, and cadiology 8/6.]  Durable Medical Equipment (DME) BP Cuff  [Continues to monitor BP on regular basis, state it is good]  PCP/Specialist Visits Compliance with follow-up visit  Exercise Interventions   Exercise Discussed/Reviewed Physical Activity  Physical Activity Discussed/Reviewed Physical Activity Reviewed  Jorje Guild with outpatient PT weekly, doing better with range of motion]  Education Interventions   Education Provided Provided Education  Provided Verbal Education On Medication  [Meds reviewed, confirms she now has refill for Amlodipine]              SDOH assessments and interventions completed:  No     Care Coordination Interventions:  Yes, provided   Follow up plan: No further intervention required.   Encounter Outcome:  Patient Visit Completed   Rodney Langton, RN, MSN, CCM [place]  [facility], [facility] RN Care Coordinator Direct Dial: [phone] / Main [phone] Fax [phone] Email: [email or web address] Website: [email or web address]

## 2023-05-27 DIAGNOSIS — M25561 Pain in right knee: Secondary | ICD-10-CM | POA: Diagnosis not present

## 2023-05-28 DIAGNOSIS — Z961 Presence of intraocular lens: Secondary | ICD-10-CM | POA: Diagnosis not present

## 2023-05-28 DIAGNOSIS — H35039 Hypertensive retinopathy, unspecified eye: Secondary | ICD-10-CM | POA: Diagnosis not present

## 2023-05-28 DIAGNOSIS — M3501 Sicca syndrome with keratoconjunctivitis: Secondary | ICD-10-CM | POA: Diagnosis not present

## 2023-06-02 DIAGNOSIS — M25561 Pain in right knee: Secondary | ICD-10-CM | POA: Diagnosis not present

## 2023-06-09 DIAGNOSIS — M25551 Pain in right hip: Secondary | ICD-10-CM | POA: Diagnosis not present

## 2023-06-09 DIAGNOSIS — M5416 Radiculopathy, lumbar region: Secondary | ICD-10-CM | POA: Diagnosis not present

## 2023-06-12 ENCOUNTER — Ambulatory Visit (INDEPENDENT_AMBULATORY_CARE_PROVIDER_SITE_OTHER): Payer: Self-pay | Admitting: Family Medicine

## 2023-06-12 ENCOUNTER — Ambulatory Visit
Admission: RE | Admit: 2023-06-12 | Discharge: 2023-06-12 | Disposition: A | Discharge status: Home / Self Care | Attending: Family Medicine | Admitting: Family Medicine

## 2023-06-12 ENCOUNTER — Encounter: Payer: Self-pay | Admitting: Family Medicine

## 2023-06-12 ENCOUNTER — Ambulatory Visit
Admission: RE | Admit: 2023-06-12 | Discharge: 2023-06-12 | Disposition: A | Discharge status: Home / Self Care | Source: Ambulatory Visit | Attending: Family Medicine | Admitting: Family Medicine

## 2023-06-12 DIAGNOSIS — Z96651 Presence of right artificial knee joint: Secondary | ICD-10-CM | POA: Diagnosis not present

## 2023-06-12 DIAGNOSIS — M5416 Radiculopathy, lumbar region: Secondary | ICD-10-CM

## 2023-06-12 DIAGNOSIS — I878 Other specified disorders of veins: Secondary | ICD-10-CM | POA: Diagnosis not present

## 2023-06-12 DIAGNOSIS — R9389 Abnormal findings on diagnostic imaging of other specified body structures: Secondary | ICD-10-CM

## 2023-06-12 DIAGNOSIS — I10 Essential (primary) hypertension: Secondary | ICD-10-CM

## 2023-06-12 DIAGNOSIS — K5909 Other constipation: Secondary | ICD-10-CM | POA: Diagnosis not present

## 2023-06-12 DIAGNOSIS — G2581 Restless legs syndrome: Secondary | ICD-10-CM | POA: Diagnosis not present

## 2023-06-12 DIAGNOSIS — G4733 Obstructive sleep apnea (adult) (pediatric): Secondary | ICD-10-CM

## 2023-06-12 DIAGNOSIS — Z96641 Presence of right artificial hip joint: Secondary | ICD-10-CM | POA: Diagnosis not present

## 2023-06-12 DIAGNOSIS — R600 Localized edema: Secondary | ICD-10-CM

## 2023-06-12 DIAGNOSIS — N1831 Chronic kidney disease, stage 3a: Secondary | ICD-10-CM

## 2023-06-12 DIAGNOSIS — E785 Hyperlipidemia, unspecified: Secondary | ICD-10-CM

## 2023-06-12 DIAGNOSIS — Z471 Aftercare following joint replacement surgery: Secondary | ICD-10-CM | POA: Diagnosis not present

## 2023-06-12 MED ORDER — AMLODIPINE BESYLATE 2.5 MG PO TABS: 2.5000 mg | ORAL_TABLET | Freq: Every day | ORAL | 1 refills | Status: DC

## 2023-06-12 MED ORDER — ASPIRIN EC 81 MG PO TBEC: 81.0000 mg | DELAYED_RELEASE_TABLET | Freq: Every evening | ORAL | 3 refills | Status: AC

## 2023-06-12 MED ORDER — POLYETHYLENE GLYCOL 3350 17 GM/SCOOP PO POWD: 17.0000 g | Freq: Every day | ORAL | 0 refills | Status: AC

## 2023-06-12 MED ORDER — OLMESARTAN MEDOXOMIL-HCTZ 40-12.5 MG PO TABS: 1.0000 | ORAL_TABLET | Freq: Every day | ORAL | 1 refills | Status: DC

## 2023-06-12 NOTE — Progress Notes (Signed)
 Name: Lisa Steele   MRN: [id number]    DOB: 10-05-1945   Date:06/12/2023       Progress Note  Subjective  Chief Complaint  Chief Complaint  Patient presents with   Medical Management of Chronic Issues   Discussed the use of AI scribe software for clinical note transcription with the patient, who gave verbal consent to proceed.  History of Present Illness Lisa Steele is a 78 year old female who presents for a regular follow-up visit.  She has a history of chest pain, which has been stable with no recent episodes. She recalls an emergency room visit in January due to chest pain, but it has since resolved. No current chest pain, shortness of breath, or palpitations.  She underwent hip x-rays at [facility],she brought a print out of the films because she is concerned about an area that is always present on LLQ. Explained likely stools but since she is concerned and I can not see the imaging on the computer we will send her for a KUB.  She has a history of constipation, attributed to previous narcotic use, and manages it with prune juice, reporting improvement. Currently, she is not experiencing constipation or abdominal pain.  She reports right knee pain and swelling following a manipulation procedure in January, with pain radiating from her back down the right lateral leg, attributed to lumbar radiculitis. She started prednisone three days ago and feels it is helping. Compression stockings were advised for lower extremity edema, but she finds them uncomfortable.  She is compliant with CPAP therapy for obstructive sleep apnea, using it every night for 6 to 8 hours, and feels rested without morning headaches. She experiences nocturia every 2 to 3 hours but does not find it disruptive to her sleep.  She has HTN and is  managed with olmesartan HCTZ 40/12.5 mg daily, and amlodipine. She also takes rosuvastatin for dyslipidemia, with recent blood work showing improved cholesterol levels. No side  effects from her medications.  She has chronic kidney disease stage 3A, with a GFR slightly below normal. She avoids NSAIDs and maintains hydration. She monitors it with regular check-ups.  She is actively engaged in physical therapy and has started using a bike and elliptical to aid in weight loss. She has lost weight since December, reducing her BMI to 35.18. She is working on further weight reduction through physical activity and portion control. Due to BMI over 35 with co-morbidities she is still considered morbid obesity but pretty soon that will change if she continues to lose weight   She has a history of restless leg syndrome but is not currently taking medication for it and does not experience sleep disturbances from it.    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   Trigger finger of left thumb 03/04/2018   S/P rotator cuff repair 01/12/2018   Injury of left rotator cuff 11/03/2017   Dysmetabolic syndrome 06/22/2014   Post-menopausal 06/22/2014   H/O cold sores 06/22/2014   Dyslipidemia 06/22/2014   Benign essential HTN 06/22/2014   Abnormal ECG 06/22/2014    Past Surgical History:  Procedure Laterality Date   APPENDECTOMY     BREAST EXCISIONAL BIOPSY Left 1970   neg   BUNIONECTOMY Left  2008   CATARACT EXTRACTION, BILATERAL  10/2013   COLONOSCOPY     2005, 2009, 2013   COLONOSCOPY WITH PROPOFOL N/A 06/25/2016   Procedure: COLONOSCOPY WITH PROPOFOL;  Surgeon: Scot Jun, MD;  Location: [facility] ENDOSCOPY;  Service: Endoscopy;  Laterality: N/A;   COLONOSCOPY WITH PROPOFOL N/A 04/11/2021   Procedure: COLONOSCOPY WITH PROPOFOL;  Surgeon: Wyline Mood, MD;  Location: [facility] ENDOSCOPY;  Service:  Gastroenterology;  Laterality: N/A;   ECTOPIC PREGNANCY SURGERY  1978   REPLACEMENT TOTAL KNEE Right 01/06/2023   Dr. Novella Olive   SHOULDER ARTHROSCOPY WITH ROTATOR CUFF REPAIR AND SUBACROMIAL DECOMPRESSION Left 11/25/2017   Procedure: SHOULDER ARTHROSCOPic release of long head biceps tendon and mini open rotator cuff repair;  Surgeon: Erin Sons, MD;  Location: [facility] ORS;  Service: Orthopedics;  Laterality: Left;   TONSILECTOMY, ADENOIDECTOMY, BILATERAL MYRINGOTOMY AND TUBES     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   Procedure: TOTAL HIP ARTHROPLASTY ANTERIOR APPROACH;  Surgeon: Kennedy Bucker, MD;  Location: [facility] ORS;  Service: Orthopedics;  Laterality: Right;   TOTAL HIP ARTHROPLASTY Right 08/30/2020   Procedure: Right hip heterotopic ossification removal;  Surgeon: Kennedy Bucker, MD;  Location: [facility] ORS;  Service: Orthopedics;  Laterality: Right;   TUBAL LIGATION      Family History  Problem Relation Age of Onset   Diabetes Mother    Hypertension Mother    Heart disease Mother    Hyperlipidemia Mother    Heart disease Father    Diabetes Sister    Heart disease Sister    Hypertension Sister    Hyperlipidemia Sister    Breast cancer Sister 44   Cancer Sister 70       breast   Breast cancer Maternal Aunt    Diabetes Sister    Stroke Sister    Diabetes Sister    Diabetes Sister     Social History   Tobacco Use   Smoking status: Former    Current packs/day: 0.00    Types: Cigarettes    Start date: 03/03/1958    Quit date: 1980    Years since quitting: 45.3   Smokeless tobacco: Never  Substance Use Topics   Alcohol use: Yes    Alcohol/week: 0.0 standard drinks of alcohol    Comment: socially     Current Outpatient Medications:    amLODipine (NORVASC) 2.5 MG tablet, Take 1 tablet (2.5 mg total) by mouth daily., Disp: 90 tablet, Rfl: 1   aspirin EC 81 MG tablet, Take 1 tablet (81 mg total) by mouth every evening. Swallow whole., Disp: 90 tablet, Rfl: 3    clindamycin (CLEOCIN) 150 MG capsule, Take 150 mg by mouth as needed (Take 4 caps 1 hour prior to appt.)., Disp: , Rfl:    clotrimazole-betamethasone (LOTRISONE) cream, Apply 1 Application topically daily., Disp: 45 g, Rfl: 0   mometasone (ELOCON) 0.1 % lotion, Apply 1 each topically daily. For ear pruritus, Disp: 60 mL, Rfl: 0   olmesartan-hydrochlorothiazide (BENICAR HCT) 40-12.5 MG tablet, Take 1 tablet by mouth daily., Disp: 90 tablet, Rfl: 1   Polyethyl Glycol-Propyl Glycol (SYSTANE) 0.4-0.3 % SOLN, Place 1 drop into both eyes at bedtime., Disp: , Rfl:    polyethylene glycol powder (GLYCOLAX/MIRALAX) 17 GM/SCOOP powder, Take 17 g by mouth 2 (two) times daily as needed., Disp: 3350 g, Rfl: 0   predniSONE (STERAPRED UNI-PAK 48 TAB) 10 MG (48) TBPK tablet, Take by mouth as directed., Disp: , Rfl:    rosuvastatin (CRESTOR) 20 MG tablet, Take  1 tablet (20 mg total) by mouth daily., Disp: 90 tablet, Rfl: 1   MOBIC 7.5 MG tablet, Take 1 tablet twice a day by oral route. (Patient not taking: Reported on 03/30/2023), Disp: , Rfl:   Allergies  Allergen Reactions   Tramadol Nausea And Vomiting   Doxycycline Itching   Penicillins Hives and Rash    leg lesions and weakness (couldn't walk)  Did it involve swelling of the face/tongue/throat, SOB, or low BP? No Did it involve sudden or severe rash/hives, skin peeling, or any reaction on the inside of your mouth or nose? No Did you need to seek medical attention at a hospital or doctor's office? Yes When did it last happen?      38 years If all above answers are "NO", may proceed with cephalosporin use.    I personally reviewed active problem list, medication list, allergies with the patient/caregiver today.   ROS  Ten systems reviewed and is negative except as mentioned in HPI    Objective Physical Exam  CONSTITUTIONAL: Patient appears well-developed and well-nourished. Obese  No distress. HEENT: Head atraumatic, normocephalic, neck supple.  Eardrums normal, no cerumen. CARDIOVASCULAR: Normal rate, regular rhythm and normal heart sounds. No murmur heard. No BLE edema. PULMONARY: Effort normal and breath sounds normal. No respiratory distress. ABDOMINAL: There is no tenderness or distention. MUSCULOSKELETAL: Normal gait. Without gross motor or sensory deficit. Knees swollen bilaterally. PSYCHIATRIC: Patient has a normal mood and affect. Behavior is normal. Judgment and thought content normal.  Vitals:   06/12/23 1039  BP: 128/76  Pulse: 69  Resp: 16  SpO2: 100%  Weight: 198 lb 9.6 oz (90.1 kg)  Height: 5\' 3"  (1.6 m)    Body mass index is 35.18 kg/m.  Recent Results (from the past 2160 hours)  Basic metabolic panel     Status: Abnormal   Collection Time: 03/25/23  3:37 PM  Result Value Ref Range   Sodium 137 135 - 145 mmol/L   Potassium 4.1 3.5 - 5.1 mmol/L   Chloride 100 98 - 111 mmol/L   CO2 28 22 - 32 mmol/L   Glucose, Bld 157 (H) 70 - 99 mg/dL    Comment: Glucose reference range applies only to samples taken after fasting for at least 8 hours.   BUN 17 8 - 23 mg/dL   Creatinine, Ser 5.78 0.44 - 1.00 mg/dL   Calcium 9.9 8.9 - 46.9 mg/dL   GFR, Estimated 60 (L) >60 mL/min    Comment: (NOTE) Calculated using the CKD-EPI Creatinine Equation (2021)    Anion gap 9 5 - 15    Comment: Performed at [facility], [address] [place], [place] [address]  CBC     Status: Abnormal   Collection Time: 03/25/23  3:37 PM  Result Value Ref Range   WBC 10.1 4.0 - 10.5 K/uL   RBC 4.68 3.87 - 5.11 MIL/uL   Hemoglobin 13.0 12.0 - 15.0 g/dL   HCT 84.1 32.4 - 40.1 %   MCV 84.6 80.0 - 100.0 fL   MCH 27.8 26.0 - 34.0 pg   MCHC 32.8 30.0 - 36.0 g/dL   RDW 02.7 (H) 25.3 - 66.4 %   Platelets 373 150 - 400 K/uL   nRBC 0.0 0.0 - 0.2 %    Comment: Performed at [facility], [address] [place], [place] [address]  Troponin I (High Sensitivity)     Status: None   Collection Time: 03/25/23  3:37 PM  Result Value Ref Range   Troponin I (High Sensitivity) 7 <18 ng/L    Comment: (NOTE) Elevated high sensitivity troponin I (hsTnI) values and significant  changes across serial measurements may suggest ACS but many other  chronic and acute conditions are known to elevate hsTnI results.  Refer to the "Links" section for chest pain algorithms and additional  guidance. Performed at [facility], [address] [place], [place] [address]       PHQ2/9:    06/12/2023   10:35 AM 03/30/2023    1:35 PM 02/11/2023    9:56 AM 12/18/2022   10:10 AM 10/10/2022    9:53 AM  Depression screen PHQ 2/9  Decreased Interest 0 0 0 0 0  Down, Depressed, Hopeless 0 0 0 0 0  PHQ - 2 Score 0 0 0 0 0  Altered sleeping 0 0 0 0 0  Tired, decreased energy 0 0 0 0 0  Change in appetite 0 0 0 0 0  Feeling bad or failure about yourself  0 0 0 0 0  Trouble concentrating 0 0 0 0 0  Moving slowly or fidgety/restless 0 0 0 0 0  Suicidal thoughts 0 0 0 0 0  PHQ-9 Score 0 0 0 0 0  Difficult doing work/chores Not difficult at all Not difficult at all Not difficult at all      phq 9 is negative  Fall Risk:    06/12/2023   10:35 AM 03/30/2023    1:35 PM 02/11/2023    9:56 AM 12/18/2022   10:10 AM 10/10/2022    9:53 AM  Fall Risk   Falls in the past year? 0 0 0 0 0  Number falls in past yr: 0 0 0  0  Injury with Fall? 0 0 0  0  Risk for fall due to : No Fall Risks No Fall Risks No Fall Risks No Fall Risks No Fall Risks  Follow up Falls prevention discussed;Education provided;Falls evaluation completed Falls prevention discussed;Education provided;Falls evaluation completed Falls prevention discussed;Education provided;Falls evaluation completed Falls prevention discussed;Education provided;Falls evaluation completed Falls prevention discussed     Assessment & Plan  Assessment & Plan Lumbar Radiculitis Right-sided leg pain likely due to lumbar radiculitis post knee manipulation. Prednisone  provides relief. - Continue prednisone as prescribed.  Abnormal Imaging Imaging shows stool in left lower quadrant due to constipation, likely from narcotic use. Offered KUB for further evaluation. - Order KUB to evaluate abdominal imaging.  Constipation chronic  Constipation likely worse  from recent  narcotic use. Prune juice provides some relief. Miralax recommended with dosage adjustment advice. - Prescribe Miralax and advise on dosage adjustment based on stool consistency.  Hypertension Hypertension controlled with olmesartan HCTZ and amlodipine. Blood pressure 128/76 mmHg. Pharmacy refill issues addressed. - Send six-month prescription refills for olmesartan HCTZ and amlodipine to [facility].  Dyslipidemia Dyslipidemia managed with rosuvastatin. LDL 82, HDL 60. No side effects reported.  Chronic Kidney Disease Stage 3A Chronic kidney disease stage 3A with GFR slightly below 60. Advised to avoid NSAIDs and maintain hydration. - Recheck kidney function in six months. - Advise to avoid NSAIDs and maintain hydration.  Lower Extremity Edema Bilateral lower extremity edema likely due to venous insufficiency. Compression stockings caused discomfort. - Recommend diabetic socks for lower extremity edema.  Obstructive Sleep Apnea Obstructive sleep apnea managed with CPAP therapy, used 6-8 hours per night. Reports feeling rested.  Morbid Obesity BMI 35.18, classified as morbidly obese. Weight loss since December  noted. Encouraged continued weight loss. - Encourage continued weight loss through physical activity and portion control.  General Health Maintenance Advised to continue physical therapy exercises at home and gradually increase physical activity. - Encourage continuation of physical therapy exercises and gradual increase in physical activity.  Follow-up Advised to follow up in six months for re-evaluation of kidney function and overall health status. - Schedule follow-up  appointment in six months.

## 2023-06-24 DIAGNOSIS — G8929 Other chronic pain: Secondary | ICD-10-CM | POA: Diagnosis not present

## 2023-06-24 DIAGNOSIS — M545 Low back pain, unspecified: Secondary | ICD-10-CM | POA: Diagnosis not present

## 2023-06-24 DIAGNOSIS — M25551 Pain in right hip: Secondary | ICD-10-CM | POA: Diagnosis not present

## 2023-06-27 DIAGNOSIS — M25551 Pain in right hip: Secondary | ICD-10-CM | POA: Diagnosis not present

## 2023-06-27 DIAGNOSIS — G8929 Other chronic pain: Secondary | ICD-10-CM | POA: Diagnosis not present

## 2023-06-27 DIAGNOSIS — M545 Low back pain, unspecified: Secondary | ICD-10-CM | POA: Diagnosis not present

## 2023-06-29 ENCOUNTER — Encounter: Payer: Self-pay | Admitting: Family Medicine

## 2023-07-02 DIAGNOSIS — M25551 Pain in right hip: Secondary | ICD-10-CM | POA: Diagnosis not present

## 2023-07-02 DIAGNOSIS — M545 Low back pain, unspecified: Secondary | ICD-10-CM | POA: Diagnosis not present

## 2023-07-02 DIAGNOSIS — G8929 Other chronic pain: Secondary | ICD-10-CM | POA: Diagnosis not present

## 2023-07-08 ENCOUNTER — Other Ambulatory Visit: Payer: Self-pay | Admitting: Family Medicine

## 2023-07-08 DIAGNOSIS — E785 Hyperlipidemia, unspecified: Secondary | ICD-10-CM

## 2023-07-16 DIAGNOSIS — G8929 Other chronic pain: Secondary | ICD-10-CM | POA: Diagnosis not present

## 2023-07-16 DIAGNOSIS — G4733 Obstructive sleep apnea (adult) (pediatric): Secondary | ICD-10-CM | POA: Diagnosis not present

## 2023-07-16 DIAGNOSIS — M25551 Pain in right hip: Secondary | ICD-10-CM | POA: Diagnosis not present

## 2023-07-16 DIAGNOSIS — M545 Low back pain, unspecified: Secondary | ICD-10-CM | POA: Diagnosis not present

## 2023-07-22 DIAGNOSIS — M5416 Radiculopathy, lumbar region: Secondary | ICD-10-CM | POA: Diagnosis not present

## 2023-07-22 DIAGNOSIS — Z96651 Presence of right artificial knee joint: Secondary | ICD-10-CM | POA: Diagnosis not present

## 2023-07-23 DIAGNOSIS — G8929 Other chronic pain: Secondary | ICD-10-CM | POA: Diagnosis not present

## 2023-07-23 DIAGNOSIS — M25551 Pain in right hip: Secondary | ICD-10-CM | POA: Diagnosis not present

## 2023-07-23 DIAGNOSIS — M545 Low back pain, unspecified: Secondary | ICD-10-CM | POA: Diagnosis not present

## 2023-07-31 ENCOUNTER — Ambulatory Visit: Payer: Self-pay

## 2023-07-31 DIAGNOSIS — R319 Hematuria, unspecified: Secondary | ICD-10-CM | POA: Diagnosis not present

## 2023-07-31 DIAGNOSIS — N390 Urinary tract infection, site not specified: Secondary | ICD-10-CM | POA: Diagnosis not present

## 2023-07-31 NOTE — Telephone Encounter (Signed)
 Chief Complaint: medication question  Disposition: [] ED /[] Urgent Care (no appt availability in office) / [] Appointment(In office/virtual)/ []  [place] [facility]/ [] Home Care/ [] Refused Recommended Disposition /[] [facility]/ []  Follow-up with PCP Additional Notes: Pt states that she is prescribed antibiotic and has not picked it up yet but was questioning the instructions. States it say a 5 days worth and to stop on June 4th. Pt questions does she take a dose on June $th or not. Instructed pt that she should received enough medication for 5 days and to take antibiotics until complete.    Copied from CRM [id number]. Topic: Clinical - Medication Question >> Jul 31, 2023  2:45 PM Zipporah Him wrote: Reason for CRM: Question about antibiotics. Reason for Disposition . [1] Caller has medicine question about med NOT prescribed by PCP AND [2] triager unable to answer question (e.g., compatibility with other med, storage)  Protocols used: Medication Question Call-A-AH

## 2023-08-05 ENCOUNTER — Encounter: Payer: Self-pay | Admitting: Family Medicine

## 2023-08-05 ENCOUNTER — Ambulatory Visit (INDEPENDENT_AMBULATORY_CARE_PROVIDER_SITE_OTHER): Admitting: Family Medicine

## 2023-08-05 VITALS — BP 130/70 | HR 91 | Resp 16 | Ht 63.0 in | Wt 197.7 lb

## 2023-08-05 DIAGNOSIS — N950 Postmenopausal bleeding: Secondary | ICD-10-CM

## 2023-08-05 NOTE — Progress Notes (Signed)
 Name: Sheldon Amara   MRN: [id number]    DOB: 09-Aug-1945   Date:08/05/2023       Progress Note  Subjective  Chief Complaint  Chief Complaint  Patient presents with   UC Follow up    Pt denies hematuria, pain or frequency. Just finished antibiotic yesterday    Discussed the use of AI scribe software for clinical note transcription with the patient, who gave verbal consent to proceed.  History of Present Illness Jesiah Yerby is a 78 year old female who presents with blood in her underwear.  She noticed blood in her underwear on Thursday, prompting a visit to urgent care on May 30th. The blood appeared pink when she wiped, raising concerns about a possible urinary tract infection, similar to one she experienced last year. She is planning to travel this weekend and wanted to address the issue before her trip.  At urgent care, she underwent a urine test, blood test, and x-ray. She was prescribed Cipro, and a urine culture was sent to the lab, which showed less than ten thousand colonies. She no longer experiences blood in her underwear. No burning sensation during urination, fever, or back pain. She has not had a hysterectomy and is unsure if the blood was from her bladder or vagina as no pelvic exam was performed at urgent care.  She mentions a burning sensation in her right dorsal hand since January, following an IV insertion. The sensation extends from the elbow down the dorsal aspect of her arm and is described as a burning feeling. She manages it with massage and topical treatments like Icy Hot.  She also reports feeling a lump in her deltoid area, which she noticed recently. It does not cause pain, but she noticed it recently.  She is planning to travel to stay with her grandchildren for five days. No cramping, neck pain, shoulder pain, or changes in appetite. She reports feeling cold due to the temperature in her house.    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   Trigger finger of left thumb 03/04/2018   S/P rotator cuff repair 01/12/2018   Injury of left rotator cuff 11/03/2017   Dysmetabolic syndrome 06/22/2014   Post-menopausal 06/22/2014   H/O cold sores 06/22/2014   Dyslipidemia 06/22/2014   Benign essential HTN 06/22/2014   Abnormal ECG 06/22/2014    Social History   Tobacco Use   Smoking status: Former    Current packs/day: 0.00    Types: Cigarettes    Start date: 03/03/1958    Quit date: 1980    Years since quitting: 45.4   Smokeless tobacco: Never  Substance Use Topics   Alcohol  use: Yes    Alcohol /week: 0.0 standard drinks of alcohol     Comment: socially     Current Outpatient Medications:    amLODipine  (NORVASC ) 2.5 MG tablet, Take 1 tablet (2.5 mg total) by mouth daily., Disp: 90 tablet, Rfl: 1   aspirin  EC 81 MG tablet, Take 1 tablet (81 mg total) by mouth every evening. Swallow whole., Disp: 90 tablet, Rfl: 3   clindamycin  (CLEOCIN ) 150 MG capsule, Take 150 mg by mouth as needed (Take 4 caps 1 hour prior to  appt.)., Disp: , Rfl:    clotrimazole -betamethasone  (LOTRISONE ) cream, Apply 1 Application topically daily., Disp: 45 g, Rfl: 0   mometasone  (ELOCON ) 0.1 % lotion, Apply 1 each topically daily. For ear pruritus, Disp: 60 mL, Rfl: 0   olmesartan -hydrochlorothiazide  (BENICAR  HCT) 40-12.5 MG tablet, Take 1 tablet by mouth daily., Disp: 90 tablet, Rfl: 1   Polyethyl Glycol-Propyl Glycol (SYSTANE) 0.4-0.3 % SOLN, Place 1 drop into both eyes at bedtime., Disp: , Rfl:    polyethylene glycol powder (GLYCOLAX /MIRALAX ) 17 GM/SCOOP powder, Take 17 g by mouth daily., Disp: 3350 g, Rfl: 0   rosuvastatin  (CRESTOR ) 20 MG tablet,  Take 1 tablet (20 mg total) by mouth daily., Disp: 90 tablet, Rfl: 1   predniSONE (STERAPRED UNI-PAK 48 TAB) 10 MG (48) TBPK tablet, Take by mouth as directed. (Patient not taking: Reported on 08/05/2023), Disp: , Rfl:   Allergies  Allergen Reactions   Tramadol Nausea And Vomiting   Doxycycline Itching   Penicillins Hives and Rash    leg lesions and weakness (couldn't walk)  Did it involve swelling of the face/tongue/throat, SOB, or low BP? No Did it involve sudden or severe rash/hives, skin peeling, or any reaction on the inside of your mouth or nose? No Did you need to seek medical attention at a hospital or doctor's office? Yes When did it last happen?      38 years If all above answers are "NO", may proceed with cephalosporin use.    ROS  Ten systems reviewed and is negative except as mentioned in HPI    Objective  Vitals:   08/05/23 1044  BP: 130/70  Pulse: 91  Resp: 16  SpO2: 99%  Weight: 197 lb 11.2 oz (89.7 kg)  Height: 5\' 3"  (1.6 m)    Body mass index is 35.02 kg/m.  Physical Exam  Constitutional: Patient appears well-developed and well-nourished. Obese  No distress.  HEENT: head atraumatic, normocephalic, pupils equal and reactive to light, neck supple Cardiovascular: Normal rate, regular rhythm and normal heart sounds.  No murmur heard. No BLE edema. Pulmonary/Chest: Effort normal and breath sounds normal. No respiratory distress. Abdominal: Soft.  There is no tenderness. Psychiatric: Patient has a normal mood and affect. behavior is normal. Judgment and thought content normal.    Assessment & Plan Possible postmenopausal bleeding Bleeding stopped, concern for postmenopausal bleeding. Urine culture negative, source uncertain. Importance of determining bleeding source explained. Not an emergency but requires investigation to rule out uterine issues. - Order pelvic ultrasound. - Instruct to perform a clean catch urine sample. - Advise to return for evaluation  if bleeding recurs. - Perform pelvic exam if bleeding recurs to determine source.  Burning sensation in right dorsal hand Burning sensation since January post-IV insertion, likely nerve irritation. Reassured temporary irritation, suggested symptomatic relief. - Recommend use of capsaicin cream or Icy Hot for symptomatic relief. - Consider nerve conduction study if symptoms persist or worsen.  Possible lipoma in deltoid region Lump in deltoid region suspected lipoma, benign. Explained no intervention needed unless discomfort or functional issues arise. - Monitor the lump for changes in size or symptoms. - Consider ultrasound if there are changes or concerns.  Follow-up Emphasized follow-up importance if symptoms recur, especially postmenopausal bleeding. - Advise to seek medical attention if significant bleeding occurs while away. - Schedule follow-up appointment after return from travel.

## 2023-08-06 ENCOUNTER — Ambulatory Visit: Payer: Self-pay | Admitting: Family Medicine

## 2023-08-06 LAB — URINALYSIS, COMPLETE
Bacteria, UA: NONE SEEN /HPF
Bilirubin Urine: NEGATIVE
Glucose, UA: NEGATIVE
Hgb urine dipstick: NEGATIVE
Hyaline Cast: NONE SEEN /LPF
Ketones, ur: NEGATIVE
Leukocytes,Ua: NEGATIVE
Nitrite: NEGATIVE
Protein, ur: NEGATIVE
Specific Gravity, Urine: 1.009 (ref 1.001–1.035)
Squamous Epithelial / HPF: NONE SEEN /HPF (ref <=5)
WBC, UA: NONE SEEN /HPF (ref 0–5)
pH: 6 (ref 5.0–8.0)

## 2023-08-07 ENCOUNTER — Ambulatory Visit
Admission: RE | Admit: 2023-08-07 | Discharge: 2023-08-07 | Disposition: A | Discharge status: Home / Self Care | Source: Ambulatory Visit | Attending: Family Medicine | Admitting: Family Medicine

## 2023-08-07 DIAGNOSIS — D259 Leiomyoma of uterus, unspecified: Secondary | ICD-10-CM | POA: Diagnosis not present

## 2023-08-07 DIAGNOSIS — N950 Postmenopausal bleeding: Secondary | ICD-10-CM | POA: Diagnosis not present

## 2023-08-07 DIAGNOSIS — N852 Hypertrophy of uterus: Secondary | ICD-10-CM | POA: Diagnosis not present

## 2023-08-07 DIAGNOSIS — Z90721 Acquired absence of ovaries, unilateral: Secondary | ICD-10-CM | POA: Diagnosis not present

## 2023-08-10 ENCOUNTER — Other Ambulatory Visit: Payer: Self-pay | Admitting: Family Medicine

## 2023-08-10 DIAGNOSIS — R935 Abnormal findings on diagnostic imaging of other abdominal regions, including retroperitoneum: Secondary | ICD-10-CM

## 2023-08-10 DIAGNOSIS — N950 Postmenopausal bleeding: Secondary | ICD-10-CM

## 2023-08-13 ENCOUNTER — Encounter: Payer: Self-pay | Admitting: Obstetrics and Gynecology

## 2023-08-13 ENCOUNTER — Other Ambulatory Visit (HOSPITAL_COMMUNITY)
Admission: RE | Admit: 2023-08-13 | Discharge: 2023-08-13 | Disposition: A | Discharge status: Home / Self Care | Source: Ambulatory Visit | Attending: Obstetrics and Gynecology | Admitting: Obstetrics and Gynecology

## 2023-08-13 ENCOUNTER — Ambulatory Visit (INDEPENDENT_AMBULATORY_CARE_PROVIDER_SITE_OTHER): Admitting: Obstetrics and Gynecology

## 2023-08-13 VITALS — BP 122/78 | HR 71 | Ht 63.0 in | Wt 201.2 lb

## 2023-08-13 DIAGNOSIS — N950 Postmenopausal bleeding: Secondary | ICD-10-CM | POA: Diagnosis not present

## 2023-08-13 DIAGNOSIS — N858 Other specified noninflammatory disorders of uterus: Secondary | ICD-10-CM | POA: Diagnosis not present

## 2023-08-13 DIAGNOSIS — R9389 Abnormal findings on diagnostic imaging of other specified body structures: Secondary | ICD-10-CM | POA: Insufficient documentation

## 2023-08-13 DIAGNOSIS — Z7689 Persons encountering health services in other specified circumstances: Secondary | ICD-10-CM

## 2023-08-13 DIAGNOSIS — M3501 Sicca syndrome with keratoconjunctivitis: Secondary | ICD-10-CM | POA: Diagnosis not present

## 2023-08-13 DIAGNOSIS — H2000 Unspecified acute and subacute iridocyclitis: Secondary | ICD-10-CM | POA: Diagnosis not present

## 2023-08-13 NOTE — Progress Notes (Signed)
 HPI:      [name] is a 78 y.o. G1P0010 who LMP was No LMP recorded. Patient is postmenopausal.  Subjective:   She presents today as a new patient.  She has had an episode of postmenopausal bleeding which was noted in her underwear and when she wiped.  This only occurred 1 time.  She had an ultrasound which shows small calcified fibroids in the endometrial thickness of 7 mm.  The report states it was somewhat difficult evaluating the lining because of the fibroids. She has experienced no further bleeding. She states that she has not previously had abnormal Pap smears.    Hx: The following portions of the patient's history were reviewed and updated as appropriate:             She  has a past medical history of 1st degree AV block, Cataracts, bilateral, DJD (degenerative joint disease), Equivalent angina (HCC), Fluttering heart, Heterotopic ossification, Hyperlipidemia, Hypertension, Obesity, Osteoarthritis, and Sleep apnea. She does not have any pertinent problems on file. She  has a past surgical history that includes Ectopic pregnancy surgery (1978); Tonsilectomy, adenoidectomy, bilateral myringotomy and tubes; Bunionectomy (Left, 2008); Cataract extraction, bilateral (10/2013); Colonoscopy with propofol  (N/A, 06/25/2016); Breast excisional biopsy (Left, 1970); Appendectomy; Shoulder arthroscopy with rotator cuff repair and subacromial decompression (Left, 11/25/2017); Total hip arthroplasty (Right, 06/16/2019); Colonoscopy; Tubal ligation; Total hip arthroplasty (Right, 08/30/2020); Colonoscopy with propofol  (N/A, 04/11/2021); and Replacement total knee (Right, 01/06/2023). Her family history includes Breast cancer in her maternal aunt; Breast cancer (age of onset: 91) in her sister; Cancer (age of onset: 84) in her sister; Diabetes in her mother, sister, sister, sister, and sister; Heart disease in her father, mother, and sister; Hyperlipidemia in her mother and sister; Hypertension in her  mother and sister; Stroke in her sister. She  reports that she quit smoking about 45 years ago. Her smoking use included cigarettes. She started smoking about 65 years ago. She has never used smokeless tobacco. She reports current alcohol  use. She reports that she does not use drugs. She has a current medication list which includes the following prescription(s): amlodipine , aspirin  ec, clindamycin , clotrimazole -betamethasone , mometasone , olmesartan -hydrochlorothiazide , systane, polyethylene glycol powder, and rosuvastatin . She is allergic to tramadol, doxycycline, and penicillins.       Review of Systems:  Review of Systems  Constitutional: Denied constitutional symptoms, night sweats, recent illness, fatigue, fever, insomnia and weight loss.  Eyes: Denied eye symptoms, eye pain, photophobia, vision change and visual disturbance.  Ears/Nose/Throat/Neck: Denied ear, nose, throat or neck symptoms, hearing loss, nasal discharge, sinus congestion and sore throat.  Cardiovascular: Denied cardiovascular symptoms, arrhythmia, chest pain/pressure, edema, exercise intolerance, orthopnea and palpitations.  Respiratory: Denied pulmonary symptoms, asthma, pleuritic pain, productive sputum, cough, dyspnea and wheezing.  Gastrointestinal: Denied, gastro-esophageal reflux, melena, nausea and vomiting.  Genitourinary: See HPI for additional information.  Musculoskeletal: Denied musculoskeletal symptoms, stiffness, swelling, muscle weakness and myalgia.  Dermatologic: Denied dermatology symptoms, rash and scar.  Neurologic: Denied neurology symptoms, dizziness, headache, neck pain and syncope.  Psychiatric: Denied psychiatric symptoms, anxiety and depression.  Endocrine: Denied endocrine symptoms including hot flashes and night sweats.   Meds:   Current Outpatient Medications on File Prior to Visit  Medication Sig Dispense Refill   amLODipine  (NORVASC ) 2.5 MG tablet Take 1 tablet (2.5 mg total) by mouth  daily. 90 tablet 1   aspirin  EC 81 MG tablet Take 1 tablet (81 mg total) by mouth every evening. Swallow whole. 90 tablet 3   clindamycin  (  CLEOCIN ) 150 MG capsule Take 150 mg by mouth as needed (Take 4 caps 1 hour prior to appt.).     clotrimazole -betamethasone  (LOTRISONE ) cream Apply 1 Application topically daily. 45 g 0   mometasone  (ELOCON ) 0.1 % lotion Apply 1 each topically daily. For ear pruritus 60 mL 0   olmesartan -hydrochlorothiazide  (BENICAR  HCT) 40-12.5 MG tablet Take 1 tablet by mouth daily. 90 tablet 1   Polyethyl Glycol-Propyl Glycol (SYSTANE) 0.4-0.3 % SOLN Place 1 drop into both eyes at bedtime.     polyethylene glycol powder (GLYCOLAX /MIRALAX ) 17 GM/SCOOP powder Take 17 g by mouth daily. 3350 g 0   rosuvastatin  (CRESTOR ) 20 MG tablet Take 1 tablet (20 mg total) by mouth daily. 90 tablet 1   No current facility-administered medications on file prior to visit.      Objective:     Vitals:   08/13/23 0826  BP: 122/78  Pulse: 71   Filed Weights   08/13/23 0826  Weight: 201 lb 3.2 oz (91.3 kg)              Physical examination   Pelvic:   Vulva: Normal appearance.  No lesions.  Vagina: No lesions or abnormalities noted.  Support: Normal pelvic support.  Urethra No masses tenderness or scarring.  Meatus Normal size without lesions or prolapse.  Cervix: Normal appearance.  No lesions.  Stenosis present  Anus: Normal exam.  No lesions.  Perineum: Normal exam.  No lesions.        Bimanual   Uterus: Normal size.  Non-tender.  Mobile.  AV.  Adnexae: No masses.  Non-tender to palpation.  Cul-de-sac: Negative for abnormality.   Endometrial Biopsy After discussion with the patient regarding her abnormal uterine bleeding I recommended that she proceed with an endometrial biopsy for further diagnosis. The risks, benefits, alternatives, and indications for an endometrial biopsy were discussed with the patient in detail. She understood the risks including infection,  bleeding, cervical laceration and uterine perforation.  Verbal consent was obtained.   PROCEDURE NOTE:  Vacurette endometrial biopsy was performed using aseptic technique with iodine preparation.  Using HurriCaine spray and grasping the cervix with an Allis clamp I was able to pass a small dilator through the stenotic cervical os.  This allowed me to proceed with the biopsy.  The uterus was sounded to a length of 6 cm.  Adequate sampling was obtained with minimal blood loss.  The patient tolerated the procedure well.  Disposition will be pending pathology           Assessment:    G1P0010 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/2019   Trigger finger of left thumb 03/04/2018   S/P rotator cuff repair 01/12/2018   Injury of left rotator cuff 11/03/2017   Dysmetabolic syndrome 06/22/2014   Post-menopausal 06/22/2014   H/O cold sores 06/22/2014   Dyslipidemia 06/22/2014   Benign essential HTN 06/22/2014   Abnormal ECG 06/22/2014     1. Establishing care with new doctor, encounter for   2. PMB (postmenopausal bleeding)   3. Thickened endometrium        Plan:            1.  Atrophic vaginitis  versus endometrial hyperplasia and endometrial cancer discussed in light of postmenopausal bleeding.  All questions answered. She underwent endometrial biopsy.  We will await results and discuss management after results return.  She will likely consider vaginal estrogen if endometrial biopsy negative. Orders No orders of the defined types were placed in this encounter.   No orders of the defined types were placed in this encounter.     F/U  Return for Pt to contact us  if symptoms  worsen, We will contact her with any abnormal test results.  Delice Felt, M.D. 08/13/2023 9:22 AM

## 2023-08-13 NOTE — Progress Notes (Signed)
 Patient presents today due to one occurrence of PMB along with fibroids. She states two weeks ago noticing dark colored blood in her underwear along with light bleeding when wiping. Reports this was the only occurrence and she has not had any pain. Recent ultrasound preformed on 08/07/23. No additional concerns.

## 2023-08-17 LAB — SURGICAL PATHOLOGY

## 2023-08-18 ENCOUNTER — Ambulatory Visit: Payer: Self-pay

## 2023-08-19 ENCOUNTER — Other Ambulatory Visit: Payer: Self-pay | Admitting: Family Medicine

## 2023-08-19 DIAGNOSIS — Z1231 Encounter for screening mammogram for malignant neoplasm of breast: Secondary | ICD-10-CM

## 2023-08-19 DIAGNOSIS — H2000 Unspecified acute and subacute iridocyclitis: Secondary | ICD-10-CM | POA: Diagnosis not present

## 2023-08-27 ENCOUNTER — Encounter: Payer: Self-pay | Admitting: Obstetrics and Gynecology

## 2023-08-27 ENCOUNTER — Telehealth (INDEPENDENT_AMBULATORY_CARE_PROVIDER_SITE_OTHER): Admitting: Obstetrics and Gynecology

## 2023-08-27 DIAGNOSIS — R9389 Abnormal findings on diagnostic imaging of other specified body structures: Secondary | ICD-10-CM

## 2023-08-27 DIAGNOSIS — N950 Postmenopausal bleeding: Secondary | ICD-10-CM | POA: Diagnosis not present

## 2023-08-27 NOTE — Progress Notes (Signed)
 Virtual Visit via Video Note  I connected with Lisa Steele on 08/27/23 at  7:55 AM EDT by video and verified that I was speaking with the correct person using two identifiers.    Lisa Steele is a 78 y.o. G1P0010 who LMP was No LMP recorded. Patient is postmenopausal. I discussed the limitations, risks, security and privacy concerns of performing an evaluation and management service by video and the availability of in person appointments. I also discussed with the patient that there may be a patient responsible charge related to this service. The patient expressed understanding and agreed to proceed.  Location of patient:  Home  Patient gave explicit verbal consent for video visit:  YES  Location of provider:  [facility] office  Persons other than physician and patient involved in provider conference:  None   Subjective:   History of Present Illness:    Patient has a history of 1 episode of postmenopausal bleeding.  An ultrasound revealed an endometrium of 7 mm and some calcified uterine fibroids.  She reports that she has had no bleeding since.  She underwent endometrial biopsy and presents today to discuss this finding.  Hx: The following portions of the patient's history were reviewed and updated as appropriate:             She  has a past medical history of 1st degree AV block, Cataracts, bilateral, DJD (degenerative joint disease), Equivalent angina (HCC), Fluttering heart, Heterotopic ossification, Hyperlipidemia, Hypertension, Obesity, Osteoarthritis, and Sleep apnea. She does not have any pertinent problems on file. She  has a past surgical history that includes Ectopic pregnancy surgery (1978); Tonsilectomy, adenoidectomy, bilateral myringotomy and tubes; Bunionectomy (Left, 2008); Cataract extraction, bilateral (10/2013); Colonoscopy with propofol  (N/A, 06/25/2016); Breast excisional biopsy (Left, 1970); Appendectomy; Shoulder arthroscopy with rotator cuff repair and subacromial  decompression (Left, 11/25/2017); Total hip arthroplasty (Right, 06/16/2019); Colonoscopy; Tubal ligation; Total hip arthroplasty (Right, 08/30/2020); Colonoscopy with propofol  (N/A, 04/11/2021); and Replacement total knee (Right, 01/06/2023). Her family history includes Breast cancer in her maternal aunt; Breast cancer (age of onset: 71) in her sister; Cancer (age of onset: 7) in her sister; Diabetes in her mother, sister, sister, sister, and sister; Heart disease in her father, mother, and sister; Hyperlipidemia in her mother and sister; Hypertension in her mother and sister; Stroke in her sister. She  reports that she quit smoking about 45 years ago. Her smoking use included cigarettes. She started smoking about 65 years ago. She has never used smokeless tobacco. She reports current alcohol  use. She reports that she does not use drugs. She has a current medication list which includes the following prescription(s): amlodipine , aspirin  ec, clindamycin , clotrimazole -betamethasone , mometasone , olmesartan -hydrochlorothiazide , systane, polyethylene glycol powder, and rosuvastatin . She is allergic to tramadol, doxycycline, and penicillins.       Review of Systems:  Review of Systems  Constitutional: Denied constitutional symptoms, night sweats, recent illness, fatigue, fever, insomnia and weight loss.  Eyes: Denied eye symptoms, eye pain, photophobia, vision change and visual disturbance.  Ears/Nose/Throat/Neck: Denied ear, nose, throat or neck symptoms, hearing loss, nasal discharge, sinus congestion and sore throat.  Cardiovascular: Denied cardiovascular symptoms, arrhythmia, chest pain/pressure, edema, exercise intolerance, orthopnea and palpitations.  Respiratory: Denied pulmonary symptoms, asthma, pleuritic pain, productive sputum, cough, dyspnea and wheezing.  Gastrointestinal: Denied, gastro-esophageal reflux, melena, nausea and vomiting.  Genitourinary: Denied genitourinary symptoms including  symptomatic vaginal discharge, pelvic relaxation issues, and urinary complaints.  Musculoskeletal: Denied musculoskeletal symptoms, stiffness, swelling, muscle weakness and myalgia.  Dermatologic: Denied  dermatology symptoms, rash and scar.  Neurologic: Denied neurology symptoms, dizziness, headache, neck pain and syncope.  Psychiatric: Denied psychiatric symptoms, anxiety and depression.  Endocrine: Denied endocrine symptoms including hot flashes and night sweats.   Meds:   Current Outpatient Medications on File Prior to Visit  Medication Sig Dispense Refill   amLODipine  (NORVASC ) 2.5 MG tablet Take 1 tablet (2.5 mg total) by mouth daily. 90 tablet 1   aspirin  EC 81 MG tablet Take 1 tablet (81 mg total) by mouth every evening. Swallow whole. 90 tablet 3   clindamycin  (CLEOCIN ) 150 MG capsule Take 150 mg by mouth as needed (Take 4 caps 1 hour prior to appt.).     clotrimazole -betamethasone  (LOTRISONE ) cream Apply 1 Application topically daily. 45 g 0   mometasone  (ELOCON ) 0.1 % lotion Apply 1 each topically daily. For ear pruritus 60 mL 0   olmesartan -hydrochlorothiazide  (BENICAR  HCT) 40-12.5 MG tablet Take 1 tablet by mouth daily. 90 tablet 1   Polyethyl Glycol-Propyl Glycol (SYSTANE) 0.4-0.3 % SOLN Place 1 drop into both eyes at bedtime.     polyethylene glycol powder (GLYCOLAX /MIRALAX ) 17 GM/SCOOP powder Take 17 g by mouth daily. 3350 g 0   rosuvastatin  (CRESTOR ) 20 MG tablet Take 1 tablet (20 mg total) by mouth daily. 90 tablet 1   No current facility-administered medications on file prior to visit.    Assessment:    G1P0010 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/2019    Trigger finger of left thumb 03/04/2018   S/P rotator cuff repair 01/12/2018   Injury of left rotator cuff 11/03/2017   Dysmetabolic syndrome 06/22/2014   Post-menopausal 06/22/2014   H/O cold sores 06/22/2014   Dyslipidemia 06/22/2014   Benign essential HTN 06/22/2014   Abnormal ECG 06/22/2014     1. PMB (postmenopausal bleeding)   2. Thickened endometrium     Atrophic endometrium by endometrial biopsy.  No evidence of hyperplasia or malignancy.  Plan:            1.  I have reassured her regarding these findings.  She will contact us  if she has any further bleeding.  Would consider vaginal estrogen if she has any further bleeding to treat atrophic vaginitis.   Orders No orders of the defined types were placed in this encounter.   No orders of the defined types were placed in this encounter.     F/U  Return for Pt to contact us  if symptoms worsen.   Alm DOROTHA Sar, M.D. 08/27/2023 8:18 AM

## 2023-09-15 ENCOUNTER — Other Ambulatory Visit: Payer: Self-pay | Admitting: Medical Genetics

## 2023-09-21 ENCOUNTER — Ambulatory Visit
Admission: RE | Admit: 2023-09-21 | Discharge: 2023-09-21 | Disposition: A | Discharge status: Home / Self Care | Source: Ambulatory Visit | Attending: Family Medicine | Admitting: Family Medicine

## 2023-09-21 DIAGNOSIS — Z1231 Encounter for screening mammogram for malignant neoplasm of breast: Secondary | ICD-10-CM | POA: Insufficient documentation

## 2023-09-22 ENCOUNTER — Encounter: Admitting: Obstetrics and Gynecology

## 2023-09-30 ENCOUNTER — Ambulatory Visit

## 2023-09-30 VITALS — Ht 63.0 in | Wt 196.0 lb

## 2023-09-30 DIAGNOSIS — Z0000 Encounter for general adult medical examination without abnormal findings: Secondary | ICD-10-CM

## 2023-09-30 NOTE — Patient Instructions (Signed)
 Lisa Steele , Thank you for taking time out of your busy schedule to complete your Annual Wellness Visit with me. I enjoyed our conversation and look forward to speaking with you again next year. I, as well as your care team,  appreciate your ongoing commitment to your health goals. Please review the following plan we discussed and let me know if I can assist you in the future. Your Game plan/ To Do List    Referrals: If you haven't heard from the office you've been referred to, please reach out to them at the phone provided.   Follow up Visits: Next Medicare AWV with our clinical staff: 09/30/24   Have you seen your provider in the last 6 months (3 months if uncontrolled diabetes)? Yes Next Office Visit with your provider: 12/10/23  Clinician Recommendations:  Aim for 30 minutes of exercise or brisk walking, 6-8 glasses of water, and 5 servings of fruits and vegetables each day.       This is a list of the screening recommended for you and due dates:  Health Maintenance  Topic Date Due   COVID-19 Vaccine (7 - 2024-25 season) 11/02/2022   Medicare Annual Wellness Visit  03/26/2023   Flu Shot  10/02/2023   DTaP/Tdap/Td vaccine (3 - Td or Tdap) 08/20/2027   Pneumococcal Vaccine for age over 58  Completed   DEXA scan (bone density measurement)  Completed   Hepatitis C Screening  Completed   Zoster (Shingles) Vaccine  Completed   Hepatitis B Vaccine  Aged Out   HPV Vaccine  Aged Out   Meningitis B Vaccine  Aged Out   Colon Cancer Screening  Discontinued    Advanced directives: (Copy Requested) Please bring a copy of your health care power of attorney and living will to the office to be added to your chart at your convenience. You can mail to [facility] [address] [place], [place] [address] or email to [email or web address] Advance Care Planning is important because it:  [x]  Makes sure you receive the medical care that is consistent with your values, goals, and  preferences  [x]  It provides guidance to your family and loved ones and reduces their decisional burden about whether or not they are making the right decisions based on your wishes.  Follow the link provided in your after visit summary or read over the paperwork we have mailed to you to help you started getting your Advance Directives in place. If you need assistance in completing these, please reach out to us  so that we can help you!  See attachments for Preventive Care and Fall Prevention Tips.

## 2023-09-30 NOTE — Progress Notes (Addendum)
 Subjective:   Lisa Steele is a 78 y.o. who presents for a Medicare Wellness preventive visit.  As a reminder, Annual Wellness Visits don't include a physical exam, and some assessments may be limited, especially if this visit is performed virtually. We may recommend an in-person follow-up visit with your provider if needed.  Visit Complete: Virtual I connected with  Lisa Steele on 09/30/23 by a audio enabled telemedicine application and verified that I am speaking with the correct person using two identifiers.  Patient Location: Home  Provider Location: Home Office  I discussed the limitations of evaluation and management by telemedicine. The patient expressed understanding and agreed to proceed.  Vital Signs: Because this visit was a virtual/telehealth visit, some criteria may be missing or patient reported. Any vitals not documented were not able to be obtained and vitals that have been documented are patient reported.  VideoDeclined- This patient declined Librarian, academic. Therefore the visit was completed with audio only.  Persons Participating in Visit: Patient.  AWV Questionnaire: No: Patient Medicare AWV questionnaire was not completed prior to this visit.  Cardiac Risk Factors include: advanced age (>1men, >50 women);hypertension;dyslipidemia;obesity (BMI >30kg/m2)     Objective:    Today's Vitals   09/30/23 1309  Weight: 196 lb (88.9 kg)  Height: 5' 3 (1.6 m)   Body mass index is 34.72 kg/m.     09/30/2023    1:11 PM 03/25/2023    3:34 PM 04/11/2021    8:39 AM 03/21/2021    9:25 AM 08/30/2020    6:23 AM 08/17/2020    5:01 PM 08/09/2020    1:56 PM  Advanced Directives  Does Patient Have a Medical Advance Directive? Yes Yes Yes Yes Yes Yes No  Type of Estate agent of [place];Living will Healthcare Power of [place];Living will Healthcare Power of [employer] Power of [employer] Power of  [place];Living will Living will   Does patient want to make changes to medical advance directive?    Yes (MAU/Ambulatory/Procedural Areas - Information given) No - Patient declined    Copy of Healthcare Power of Attorney in Chart? No - copy requested  No - copy requested No - copy requested     Would patient like information on creating a medical advance directive?       No - Patient declined    Current Medications (verified) Outpatient Encounter Medications as of 09/30/2023  Medication Sig   amLODipine  (NORVASC ) 2.5 MG tablet Take 1 tablet (2.5 mg total) by mouth daily.   aspirin  EC 81 MG tablet Take 1 tablet (81 mg total) by mouth every evening. Swallow whole.   clindamycin  (CLEOCIN ) 150 MG capsule Take 150 mg by mouth as needed (Take 4 caps 1 hour prior to appt.).   clotrimazole -betamethasone  (LOTRISONE ) cream Apply 1 Application topically daily.   mometasone  (ELOCON ) 0.1 % lotion Apply 1 each topically daily. For ear pruritus   olmesartan -hydrochlorothiazide  (BENICAR  HCT) 40-12.5 MG tablet Take 1 tablet by mouth daily.   Polyethyl Glycol-Propyl Glycol (SYSTANE) 0.4-0.3 % SOLN Place 1 drop into both eyes at bedtime.   polyethylene glycol powder (GLYCOLAX /MIRALAX ) 17 GM/SCOOP powder Take 17 g by mouth daily.   rosuvastatin  (CRESTOR ) 20 MG tablet Take 1 tablet (20 mg total) by mouth daily.   No facility-administered encounter medications on file as of 09/30/2023.    Allergies (verified) Tramadol, Doxycycline, and Penicillins   History: Past Medical History:  Diagnosis Date   1st degree AV block  Cataracts, bilateral    DJD (degenerative joint disease)    Equivalent angina (HCC)    Fluttering heart    Heterotopic ossification    RIGHT hip   Hyperlipidemia    Hypertension    Obesity    Osteoarthritis    Sleep apnea    wears CPAP - Dr. Theotis   Past Surgical History:  Procedure Laterality Date   APPENDECTOMY     BREAST EXCISIONAL BIOPSY Left 1970   neg   BUNIONECTOMY  Left 2008   CATARACT EXTRACTION, BILATERAL  10/2013   COLONOSCOPY     2005, 2009, 2013   COLONOSCOPY WITH PROPOFOL  N/A 06/25/2016   Procedure: COLONOSCOPY WITH PROPOFOL ;  Surgeon: Lamar ONEIDA Holmes, MD;  Location: [facility] ENDOSCOPY;  Service: Endoscopy;  Laterality: N/A;   COLONOSCOPY WITH PROPOFOL  N/A 04/11/2021   Procedure: COLONOSCOPY WITH PROPOFOL ;  Surgeon: Therisa Bi, MD;  Location: [facility] ENDOSCOPY;  Service: Gastroenterology;  Laterality: N/A;   ECTOPIC PREGNANCY SURGERY  1978   REPLACEMENT TOTAL KNEE Right 01/06/2023   Dr. Cipriano   SHOULDER ARTHROSCOPY WITH ROTATOR CUFF REPAIR AND SUBACROMIAL DECOMPRESSION Left 11/25/2017   Procedure: SHOULDER ARTHROSCOPic release of long head biceps tendon and mini open rotator cuff repair;  Surgeon: Maryl Barters, MD;  Location: [facility] ORS;  Service: Orthopedics;  Laterality: Left;   TONSILECTOMY, ADENOIDECTOMY, BILATERAL MYRINGOTOMY AND TUBES     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   Procedure: TOTAL HIP ARTHROPLASTY ANTERIOR APPROACH;  Surgeon: Kathlynn Sharper, MD;  Location: [facility] ORS;  Service: Orthopedics;  Laterality: Right;   TOTAL HIP ARTHROPLASTY Right 08/30/2020   Procedure: Right hip heterotopic ossification removal;  Surgeon: Kathlynn Sharper, MD;  Location: [facility] ORS;  Service: Orthopedics;  Laterality: Right;   TUBAL LIGATION     Family History  Problem Relation Age of Onset   Diabetes Mother    Hypertension Mother    Heart disease Mother    Hyperlipidemia Mother    Heart disease Father    Diabetes Sister    Heart disease Sister    Hypertension Sister    Hyperlipidemia Sister    Breast cancer Sister 67   Cancer Sister 28       breast   Breast cancer Maternal Aunt    Diabetes Sister    Stroke Sister    Diabetes Sister    Diabetes Sister    Social History   Socioeconomic History   Marital status: Married    Spouse name: Lilian    Number of children: 1   Years of education: Not on file   Highest education level:  Associate degree: occupational, Scientist, product/process development, or vocational program  Occupational History   Occupation: Theatre stage manager     Comment: [employer]   Tobacco Use   Smoking status: Former    Current packs/day: 0.00    Types: Cigarettes    Start date: 03/03/1958    Quit date: 1980    Years since quitting: 45.6   Smokeless tobacco: Never  Vaping Use   Vaping status: Never Used  Substance and Sexual Activity   Alcohol  use: Yes    Alcohol /week: 0.0 standard drinks of alcohol     Comment: socially   Drug use: No   Sexual activity: Not Currently    Birth control/protection: Post-menopausal    Comment: husband has ED  Other Topics Concern   Not on file  Social History Narrative   Married, son lives in [place]  and has 3 grandchildren , youngest born 10/2016   Social Drivers  of Health   Financial Resource Strain: Low Risk  (09/30/2023)   Overall Financial Resource Strain (CARDIA)    Difficulty of Paying Living Expenses: Not hard at all  Food Insecurity: No Food Insecurity (09/30/2023)   Hunger Vital Sign    Worried About Running Out of Food in the Last Year: Never true    Ran Out of Food in the Last Year: Never true  Transportation Needs: No Transportation Needs (09/30/2023)   PRAPARE - Administrator, Civil Service (Medical): No    Lack of Transportation (Non-Medical): No  Physical Activity: Sufficiently Active (09/30/2023)   Exercise Vital Sign    Days of Exercise per Week: 4 days    Minutes of Exercise per Session: 60 min  Stress: No Stress Concern Present (09/30/2023)   [employer] of Occupational Health - Occupational Stress Questionnaire    Feeling of Stress: Not at all  Social Connections: Moderately Integrated (09/30/2023)   Social Connection and Isolation Panel    Frequency of Communication with Friends and Family: More than three times a week    Frequency of Social Gatherings with Friends and Family: More than three times a week    Attends  Religious Services: More than 4 times per year    Active Member of [employer] or Organizations: No    Attends Engineer, structural: Never    Marital Status: Married    Tobacco Counseling Counseling given: Not Answered    Clinical Intake:  Pre-visit preparation completed: Yes  Pain : No/denies pain     BMI - recorded: 34.72 Nutritional Status: BMI > 30  Obese Diabetes: No  Lab Results  Component Value Date   HGBA1C 5.0 02/11/2023   HGBA1C 5.0 10/26/2019   HGBA1C 4.9 04/18/2019     How often do you need to have someone help you when you read instructions, pamphlets, or other written materials from your doctor or pharmacy?: 1 - Never  Interpreter Needed?: No  Information entered by :: Ellouise Haws, LPN   Activities of Daily Living     09/30/2023    1:10 PM 12/18/2022   10:10 AM  In your present state of health, do you have any difficulty performing the following activities:  Hearing? 0 0  Vision? 0 0  Difficulty concentrating or making decisions? 0 0  Walking or climbing stairs? 0 0  Dressing or bathing? 0 0  Doing errands, shopping? 0 0  Preparing Food and eating ? N   Using the Toilet? N   In the past six months, have you accidently leaked urine? N   Do you have problems with loss of bowel control? N   Managing your Medications? N   Managing your Finances? N   Housekeeping or managing your Housekeeping? N     Patient Care Team: Sowles, Krichna, MD as PCP - General (Family Medicine) Florencio Cara BIRCH, MD as Consulting Physician (Cardiology) Kathlynn Sharper, MD as Consulting Physician (Orthopedic Surgery) Fleming, Herbon E, MD as Referring Physician (Specialist)  I have updated your Care Teams any recent Medical Services you may have received from other providers in the past year.     Assessment:   This is a routine wellness examination for [place].  Hearing/Vision screen Hearing Screening - Comments:: Pt denies any hearing issues  Vision  Screening - Comments:: Wears rx glasses - up to date with routine eye exams with Dr Shayne    Goals Addressed  This Visit's Progress    Patient Stated       Weight loss       Depression Screen     09/30/2023    1:13 PM 08/05/2023   10:40 AM 06/12/2023   10:35 AM 03/30/2023    1:35 PM 02/11/2023    9:56 AM 12/18/2022   10:10 AM 10/10/2022    9:53 AM  PHQ 2/9 Scores  PHQ - 2 Score 0 0 0 0 0 0 0  PHQ- 9 Score 0 0 0 0 0 0 0    Fall Risk     09/30/2023    1:15 PM 08/05/2023   10:40 AM 06/12/2023   10:35 AM 03/30/2023    1:35 PM 02/11/2023    9:56 AM  Fall Risk   Falls in the past year? 0 0 0 0 0  Number falls in past yr: 0 0 0 0 0  Injury with Fall? 0 0 0 0 0  Risk for fall due to : No Fall Risks No Fall Risks No Fall Risks No Fall Risks No Fall Risks  Follow up Falls prevention discussed Falls prevention discussed;Education provided;Falls evaluation completed Falls prevention discussed;Education provided;Falls evaluation completed Falls prevention discussed;Education provided;Falls evaluation completed Falls prevention discussed;Education provided;Falls evaluation completed    MEDICARE RISK AT HOME:  Medicare Risk at Home Any stairs in or around the home?: Yes If so, are there any without handrails?: No Home free of loose throw rugs in walkways, pet beds, electrical cords, etc?: Yes Adequate lighting in your home to reduce risk of falls?: Yes Life alert?: No Use of a cane, walker or w/c?: Yes Grab bars in the bathroom?: No Shower chair or bench in shower?: Yes Elevated toilet seat or a handicapped toilet?: No  TIMED UP AND GO:  Was the test performed?  No  Cognitive Function: 6CIT completed        09/30/2023    1:16 PM 03/25/2022    9:26 AM 03/09/2018    3:19 PM  6CIT Screen  What Year? 0 points 0 points 0 points  What month? 0 points 0 points 0 points  What time? 0 points 0 points 0 points  Count back from 20 0 points 0 points 0 points  Months in  reverse 0 points 0 points 0 points  Repeat phrase 0 points 2 points 0 points  Total Score 0 points 2 points 0 points    Immunizations Immunization History  Administered Date(s) Administered    sv, Bivalent, Protein Subunit Rsvpref,pf (Abrysvo) 11/01/2021   Fluad Quad(high Dose 65+) 11/15/2018   Fluad Trivalent(High Dose 65+) 12/18/2022   Influenza, High Dose Seasonal PF 02/14/2016, 11/12/2016, 01/12/2018, 10/28/2019, 12/03/2020, 12/16/2021   Influenza,inj,Quad PF,6+ Mos 02/10/2014, 01/29/2015   Influenza-Unspecified 02/10/2014, 01/29/2015, 02/14/2016, 11/12/2016   Moderna SARS-COV2 Booster Vaccination 01/29/2022   PFIZER(Purple Top)SARS-COV-2 Vaccination 03/25/2019, 04/15/2019, 12/12/2019, 06/08/2020   Pfizer Covid-19 Vaccine Bivalent Booster 80yrs & up 12/24/2020, 07/11/2021   Pneumococcal Conjugate-13 08/14/2014   Pneumococcal Polysaccharide-23 01/27/2012   Tdap 12/27/2010, 08/19/2017   Zoster Recombinant(Shingrix) 11/22/2018, 10/28/2019   Zoster, Live 01/01/2011    Screening Tests Health Maintenance  Topic Date Due   COVID-19 Vaccine (7 - 2024-25 season) 11/02/2022   INFLUENZA VACCINE  10/02/2023   Medicare Annual Wellness (AWV)  09/29/2024   DTaP/Tdap/Td (3 - Td or Tdap) 08/20/2027   Pneumococcal Vaccine: 50+ Years  Completed   DEXA SCAN  Completed   Hepatitis C Screening  Completed   Zoster Vaccines- Shingrix  Completed   Hepatitis B Vaccines  Aged Out   HPV VACCINES  Aged Out   Meningococcal B Vaccine  Aged Out   Colonoscopy  Discontinued    Health Maintenance  Health Maintenance Due  Topic Date Due   COVID-19 Vaccine (7 - 2024-25 season) 11/02/2022   Health Maintenance Items Addressed: See Nurse Notes at the end of this note  Additional Screening:  Vision Screening: Recommended annual ophthalmology exams for early detection of glaucoma and other disorders of the eye. Would you like a referral to an eye doctor? No    Dental Screening: Recommended annual  dental exams for proper oral hygiene  Community Resource Referral / Chronic Care Management: CRR required this visit?  No   CCM required this visit?  No   Plan:    I have personally reviewed and noted the following in the patient's chart:   Medical and social history Use of alcohol , tobacco or illicit drugs  Current medications and supplements including opioid prescriptions. Patient is not currently taking opioid prescriptions. Functional ability and status Nutritional status Physical activity Advanced directives List of other physicians Hospitalizations, surgeries, and ER visits in previous 12 months Vitals Screenings to include cognitive, depression, and falls Referrals and appointments  In addition, I have reviewed and discussed with patient certain preventive protocols, quality metrics, and best practice recommendations. A written personalized care plan for preventive services as well as general preventive health recommendations were provided to patient.   Ellouise VEAR Haws, LPN   [id number]   After Visit Summary: (MyChart) Due to this being a telephonic visit, the after visit summary with patients personalized plan was offered to patient via MyChart   Notes: Nothing significant to report at this time.

## 2023-10-07 DIAGNOSIS — G4733 Obstructive sleep apnea (adult) (pediatric): Secondary | ICD-10-CM | POA: Diagnosis not present

## 2023-10-07 DIAGNOSIS — E669 Obesity, unspecified: Secondary | ICD-10-CM | POA: Diagnosis not present

## 2023-10-07 DIAGNOSIS — R9431 Abnormal electrocardiogram [ECG] [EKG]: Secondary | ICD-10-CM | POA: Diagnosis not present

## 2023-10-07 DIAGNOSIS — E785 Hyperlipidemia, unspecified: Secondary | ICD-10-CM | POA: Diagnosis not present

## 2023-10-07 DIAGNOSIS — I10 Essential (primary) hypertension: Secondary | ICD-10-CM | POA: Diagnosis not present

## 2023-10-07 DIAGNOSIS — Z96641 Presence of right artificial hip joint: Secondary | ICD-10-CM | POA: Diagnosis not present

## 2023-10-08 DIAGNOSIS — M61551 Other ossification of muscle, right thigh: Secondary | ICD-10-CM | POA: Diagnosis not present

## 2023-10-08 DIAGNOSIS — Z471 Aftercare following joint replacement surgery: Secondary | ICD-10-CM | POA: Diagnosis not present

## 2023-10-08 DIAGNOSIS — Z96651 Presence of right artificial knee joint: Secondary | ICD-10-CM | POA: Diagnosis not present

## 2023-10-08 DIAGNOSIS — Z96641 Presence of right artificial hip joint: Secondary | ICD-10-CM | POA: Diagnosis not present

## 2023-10-08 DIAGNOSIS — M5116 Intervertebral disc disorders with radiculopathy, lumbar region: Secondary | ICD-10-CM | POA: Diagnosis not present

## 2023-10-12 DIAGNOSIS — Z96641 Presence of right artificial hip joint: Secondary | ICD-10-CM | POA: Diagnosis not present

## 2023-12-02 DIAGNOSIS — M25661 Stiffness of right knee, not elsewhere classified: Secondary | ICD-10-CM | POA: Diagnosis not present

## 2023-12-02 DIAGNOSIS — Z471 Aftercare following joint replacement surgery: Secondary | ICD-10-CM | POA: Diagnosis not present

## 2023-12-02 DIAGNOSIS — Z96641 Presence of right artificial hip joint: Secondary | ICD-10-CM | POA: Diagnosis not present

## 2023-12-02 DIAGNOSIS — M65961 Unspecified synovitis and tenosynovitis, right lower leg: Secondary | ICD-10-CM | POA: Diagnosis not present

## 2023-12-10 ENCOUNTER — Ambulatory Visit (INDEPENDENT_AMBULATORY_CARE_PROVIDER_SITE_OTHER): Admitting: Family Medicine

## 2023-12-10 ENCOUNTER — Encounter: Payer: Self-pay | Admitting: Family Medicine

## 2023-12-10 VITALS — BP 116/76 | HR 86 | Resp 16 | Ht 63.0 in | Wt 200.4 lb

## 2023-12-10 DIAGNOSIS — I10 Essential (primary) hypertension: Secondary | ICD-10-CM | POA: Diagnosis not present

## 2023-12-10 DIAGNOSIS — G4733 Obstructive sleep apnea (adult) (pediatric): Secondary | ICD-10-CM

## 2023-12-10 DIAGNOSIS — G2581 Restless legs syndrome: Secondary | ICD-10-CM

## 2023-12-10 DIAGNOSIS — E559 Vitamin D deficiency, unspecified: Secondary | ICD-10-CM

## 2023-12-10 DIAGNOSIS — N1831 Chronic kidney disease, stage 3a: Secondary | ICD-10-CM

## 2023-12-10 DIAGNOSIS — E785 Hyperlipidemia, unspecified: Secondary | ICD-10-CM | POA: Diagnosis not present

## 2023-12-10 DIAGNOSIS — M170 Bilateral primary osteoarthritis of knee: Secondary | ICD-10-CM | POA: Diagnosis not present

## 2023-12-10 MED ORDER — ROSUVASTATIN CALCIUM 20 MG PO TABS: 20.0000 mg | ORAL_TABLET | Freq: Every day | ORAL | 1 refills | Status: AC

## 2023-12-10 MED ORDER — OLMESARTAN MEDOXOMIL-HCTZ 40-12.5 MG PO TABS: 1.0000 | ORAL_TABLET | Freq: Every day | ORAL | 1 refills | Status: AC

## 2023-12-10 MED ORDER — AMLODIPINE BESYLATE 2.5 MG PO TABS: 2.5000 mg | ORAL_TABLET | Freq: Every day | ORAL | 1 refills | Status: AC

## 2023-12-10 NOTE — Progress Notes (Signed)
 Name: Lisa Steele   MRN: [id number]    DOB: 19-Jan-1946   Date:12/10/2023       Progress Note  Subjective  Chief Complaint  Chief Complaint  Patient presents with   Medical Management of Chronic Issues   Discussed the use of AI scribe software for clinical note transcription with the patient, who gave verbal consent to proceed.  History of Present Illness Lisa Steele is a 78 year old female with hypertension and chronic kidney disease who presents for a six-month follow-up visit.  Her blood pressure has been stable, with recent readings of 116/76 mmHg, 126/66 mmHg at a cardiologist visit, and 130/70 mmHg in June. She is on olmesartan  HCTZ 40/12.5 mg and amlodipine  2.5 mg. No dizziness, lightheadedness, or fatigue.  In June, she experienced possible postmenopausal bleeding with blood in her underwear. A pelvic ultrasound showed fibroids and a 7 mm endometrial thickness. An endometrial biopsy was negative for atypia or hyperplasia. No further bleeding has occurred.  She uses a CPAP machine nightly for obstructive sleep apnea without issues.  She is on rosuvastatin  20 mg for dyslipidemia. Her last cholesterol panel was done in December 2024.  Her  kidney function (GFR) is around 59. She denies symptoms such as itching or urinary issues. She uses Tylenol  for pain management and is aware that needs to avoid NSAID's  For restless leg syndrome, she uses topical creams for relief when needed. No need to get up or shake her legs at night.  She has osteoarthritis in her knees, with the right knee previously replaced. She completed physical therapy and does home exercises. She is not ready for surgery on the left knee yet.  Her BMI is over 35 now , she gained some weight since last visit. She is going to join a gym to try to become more active  She reports a small, non-tender lump on her right wrist, which occasionally causes pain in her hand.   She takes vitamin D  supplements for deficiency  without issues.    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/2019   Trigger finger of left thumb 03/04/2018   S/P rotator cuff repair 01/12/2018   Injury of left rotator cuff 11/03/2017   Dysmetabolic syndrome 06/22/2014   Post-menopausal 06/22/2014   H/O cold sores 06/22/2014   Dyslipidemia 06/22/2014   Benign essential HTN 06/22/2014   Abnormal ECG 06/22/2014    Past Surgical History:  Procedure Laterality Date   APPENDECTOMY     BREAST EXCISIONAL BIOPSY Left 1970   neg   BUNIONECTOMY Left 2008   CATARACT EXTRACTION, BILATERAL  10/2013   COLONOSCOPY     2005, 2009, 2013   COLONOSCOPY WITH PROPOFOL  N/A 06/25/2016   Procedure: COLONOSCOPY WITH PROPOFOL ;  Surgeon: Lamar ONEIDA Holmes, MD;  Location: [facility] ENDOSCOPY;  Service: Endoscopy;  Laterality: N/A;   COLONOSCOPY WITH PROPOFOL  N/A 04/11/2021   Procedure: COLONOSCOPY WITH PROPOFOL ;  Surgeon: Therisa Bi, MD;  Location: [facility] ENDOSCOPY;  Service: Gastroenterology;  Laterality: N/A;   ECTOPIC PREGNANCY SURGERY  1978   REPLACEMENT TOTAL KNEE Right 01/06/2023   Dr. Cipriano   SHOULDER ARTHROSCOPY WITH ROTATOR CUFF REPAIR AND SUBACROMIAL DECOMPRESSION Left 11/25/2017   Procedure: SHOULDER  ARTHROSCOPic release of long head biceps tendon and mini open rotator cuff repair;  Surgeon: Maryl Barters, MD;  Location: [facility] ORS;  Service: Orthopedics;  Laterality: Left;   TONSILECTOMY, ADENOIDECTOMY, BILATERAL MYRINGOTOMY AND TUBES     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   Procedure: TOTAL HIP ARTHROPLASTY ANTERIOR APPROACH;  Surgeon: Kathlynn Sharper, MD;  Location: [facility] ORS;  Service: Orthopedics;  Laterality: Right;   TOTAL HIP  ARTHROPLASTY Right 08/30/2020   Procedure: Right hip heterotopic ossification removal;  Surgeon: Kathlynn Sharper, MD;  Location: [facility] ORS;  Service: Orthopedics;  Laterality: Right;   TUBAL LIGATION      Family History  Problem Relation Age of Onset   Diabetes Mother    Hypertension Mother    Heart disease Mother    Hyperlipidemia Mother    Heart disease Father    Diabetes Sister    Heart disease Sister    Hypertension Sister    Hyperlipidemia Sister    Breast cancer Sister 46   Cancer Sister 86       breast   Breast cancer Maternal Aunt    Diabetes Sister    Stroke Sister    Diabetes Sister    Diabetes Sister     Social History   Tobacco Use   Smoking status: Former    Current packs/day: 0.00    Types: Cigarettes    Start date: 03/03/1958    Quit date: 1980    Years since quitting: 45.8   Smokeless tobacco: Never  Substance Use Topics   Alcohol  use: Yes    Alcohol /week: 0.0 standard drinks of alcohol     Comment: socially     Current Outpatient Medications:    amLODipine  (NORVASC ) 2.5 MG tablet, Take 1 tablet (2.5 mg total) by mouth daily., Disp: 90 tablet, Rfl: 1   aspirin  EC 81 MG tablet, Take 1 tablet (81 mg total) by mouth every evening. Swallow whole., Disp: 90 tablet, Rfl: 3   clindamycin  (CLEOCIN ) 150 MG capsule, Take 150 mg by mouth as needed (Take 4 caps 1 hour prior to appt.)., Disp: , Rfl:    clotrimazole -betamethasone  (LOTRISONE ) cream, Apply 1 Application topically daily., Disp: 45 g, Rfl: 0   mometasone  (ELOCON ) 0.1 % lotion, Apply 1 each topically daily. For ear pruritus, Disp: 60 mL, Rfl: 0   olmesartan -hydrochlorothiazide  (BENICAR  HCT) 40-12.5 MG tablet, Take 1 tablet by mouth daily., Disp: 90 tablet, Rfl: 1   Polyethyl Glycol-Propyl Glycol (SYSTANE) 0.4-0.3 % SOLN, Place 1 drop into both eyes at bedtime., Disp: , Rfl:    polyethylene glycol powder (GLYCOLAX /MIRALAX ) 17 GM/SCOOP powder, Take 17 g by mouth daily., Disp: 3350 g, Rfl: 0   prednisoLONE  acetate (PRED FORTE) 1 % ophthalmic suspension, SHAKE LIQUID AND INSTILL 1 DROP IN LEFT EYE THREE TIMES DAILY, Disp: , Rfl:    predniSONE (STERAPRED UNI-PAK 48 TAB) 10 MG (48) TBPK tablet, Take by mouth as directed., Disp: , Rfl:    rosuvastatin  (CRESTOR ) 20 MG tablet, Take 1 tablet (20 mg total) by mouth daily., Disp: 90 tablet, Rfl: 1  Allergies  Allergen Reactions   Tramadol Nausea And Vomiting   Doxycycline Itching   Penicillins Hives and Rash    leg lesions and weakness (couldn't walk)  Did it involve swelling of the face/tongue/throat, SOB, or low BP? No Did it involve sudden or severe rash/hives, skin peeling, or any reaction on the inside of your mouth or nose? No Did you need to seek medical attention at a hospital or doctor's  office? Yes When did it last happen?      38 years If all above answers are "NO", may proceed with cephalosporin use.    I personally reviewed active problem list, medication list, allergies, family history with the patient/caregiver today.   ROS  Ten systems reviewed and is negative except as mentioned in HPI    Objective Physical Exam  CONSTITUTIONAL: Patient appears well-developed and well-nourished. No distress. HEENT: Head atraumatic, normocephalic, neck supple. CARDIOVASCULAR: Normal rate, regular rhythm and normal heart sounds. No murmur heard. No BLE edema. PULMONARY: Effort normal and breath sounds normal. No respiratory distress. ABDOMINAL: There is no tenderness or distention. MUSCULOSKELETAL: Normal gait. Without gross motor or sensory deficit. Right hand non-tender on palpation. Possible lipoma verus cyst  over right radial wrist  PSYCHIATRIC: Patient has a normal mood and affect. Behavior is normal. Judgment and thought content normal.  Vitals:   12/10/23 1017  BP: 116/76  Pulse: 86  Resp: 16  SpO2: 99%  Weight: 200 lb 6.4 oz (90.9 kg)  Height: 5' 3 (1.6 m)    Body mass index is 35.5 kg/m.   PHQ2/9:    12/10/2023    10:13 AM 09/30/2023    1:13 PM 08/05/2023   10:40 AM 06/12/2023   10:35 AM 03/30/2023    1:35 PM  Depression screen PHQ 2/9  Decreased Interest 0 0 0 0 0  Down, Depressed, Hopeless 0 0 0 0 0  PHQ - 2 Score 0 0 0 0 0  Altered sleeping  0 0 0 0  Tired, decreased energy  0 0 0 0  Change in appetite  0 0 0 0  Feeling bad or failure about yourself   0 0 0 0  Trouble concentrating  0 0 0 0  Moving slowly or fidgety/restless  0 0 0 0  Suicidal thoughts  0 0 0 0  PHQ-9 Score  0 0 0 0  Difficult doing work/chores  Not difficult at all Not difficult at all Not difficult at all Not difficult at all    phq 9 is negative  Fall Risk:    12/10/2023   10:12 AM 09/30/2023    1:15 PM 08/05/2023   10:40 AM 06/12/2023   10:35 AM 03/30/2023    1:35 PM  Fall Risk   Falls in the past year? 0 0 0 0 0  Number falls in past yr: 0 0 0 0 0  Injury with Fall? 0 0 0 0 0  Risk for fall due to : No Fall Risks No Fall Risks No Fall Risks No Fall Risks No Fall Risks  Follow up Falls evaluation completed Falls prevention discussed Falls prevention discussed;Education provided;Falls evaluation completed Falls prevention discussed;Education provided;Falls evaluation completed Falls prevention discussed;Education provided;Falls evaluation completed      Assessment & Plan Morbid obesity BMI over 35 with comorbid hypertension and dyslipidemia. Weight loss recommended for health improvement. - Encourage weight loss through increased physical activity, such as water aerobics. - Aim to lose 4-5 pounds to reduce BMI below 35.  Bilateral knee osteoarthritis, status post right knee replacement Right knee replacement completed. Left knee replacement deferred until right knee function improves. Weight loss encouraged to alleviate symptoms. - Continue home exercises for range of motion and strength. - Encourage weight loss to alleviate knee symptoms.  Essential hypertension Blood pressure well-controlled with current  medication. No symptoms of hypotension. - Continue current medications: olmesartan  HCTZ 40/12.5 mg and amlodipine  2.5 mg. - Monitor blood pressure at  home. - Adjust medication if persistent low blood pressure with symptoms occurs.  Chronic kidney disease stage 3a GFR slightly below 60, consistent with stage 3a CKD. Current medications are kidney protective. - Continue olmesartan  for kidney protection. - Avoid NSAIDs such as Aleve and Motrin. - Order blood work to monitor kidney function.  Hyperlipidemia Managed with rosuvastatin  20 mg. No side effects reported. - Order blood work to check cholesterol levels.  Obstructive sleep apnea Uses CPAP machine every night without issues.  Restless legs syndrome Symptoms managed with topical cream. - Continue using topical cream as needed.  Vitamin D  deficiency Managed with over-the-counter vitamin D  supplements. - Continue vitamin D  supplementation. - Order blood work to check vitamin D  levels.  Right wrist/hand lump (possible cyst or lipoma) Small lump on right wrist/hand, possibly a cyst or lipoma. Not tender or causing significant issues. - Use ice or topical medication if inflammation occurs.

## 2023-12-11 ENCOUNTER — Ambulatory Visit: Payer: Self-pay | Admitting: Family Medicine

## 2023-12-11 LAB — CBC WITH DIFFERENTIAL/PLATELET
Absolute Lymphocytes: 2538 {cells}/uL (ref 850–3900)
Absolute Monocytes: 556 {cells}/uL (ref 200–950)
Basophils Absolute: 68 {cells}/uL (ref 0–200)
Basophils Relative: 1.3 %
Eosinophils Absolute: 68 {cells}/uL (ref 15–500)
Eosinophils Relative: 1.3 %
HCT: 42.3 % (ref 35.0–45.0)
Hemoglobin: 13.7 g/dL (ref 11.7–15.5)
MCH: 27.8 pg (ref 27.0–33.0)
MCHC: 32.4 g/dL (ref 32.0–36.0)
MCV: 85.8 fL (ref 80.0–100.0)
MPV: 11.6 fL (ref 7.5–12.5)
Monocytes Relative: 10.7 %
Neutro Abs: 1971 {cells}/uL (ref 1500–7800)
Neutrophils Relative %: 37.9 %
Platelets: 395 Thousand/uL (ref 140–400)
RBC: 4.93 Million/uL (ref 3.80–5.10)
RDW: 14.5 % (ref 11.0–15.0)
Total Lymphocyte: 48.8 %
WBC: 5.2 Thousand/uL (ref 3.8–10.8)

## 2023-12-11 LAB — COMPREHENSIVE METABOLIC PANEL WITH GFR
AG Ratio: 1.5 (calc) (ref 1.0–2.5)
ALT: 15 U/L (ref 6–29)
AST: 19 U/L (ref 10–35)
Albumin: 4.5 g/dL (ref 3.6–5.1)
Alkaline phosphatase (APISO): 71 U/L (ref 37–153)
BUN: 14 mg/dL (ref 7–25)
CO2: 32 mmol/L (ref 20–32)
Calcium: 10.4 mg/dL (ref 8.6–10.4)
Chloride: 103 mmol/L (ref 98–110)
Creat: 0.95 mg/dL (ref 0.60–1.00)
Globulin: 3.1 g/dL (ref 1.9–3.7)
Glucose, Bld: 119 mg/dL — ABNORMAL HIGH (ref 65–99)
Potassium: 4.8 mmol/L (ref 3.5–5.3)
Sodium: 142 mmol/L (ref 135–146)
Total Bilirubin: 0.8 mg/dL (ref 0.2–1.2)
Total Protein: 7.6 g/dL (ref 6.1–8.1)
eGFR: 62 mL/min/1.73m2 (ref >=60)

## 2023-12-11 LAB — VITAMIN D 25 HYDROXY (VIT D DEFICIENCY, FRACTURES): Vit D, 25-Hydroxy: 55 ng/mL (ref 30–100)

## 2023-12-11 LAB — LIPID PANEL
Cholesterol: 163 mg/dL (ref <200)
HDL: 62 mg/dL (ref >=50)
LDL Cholesterol (Calc): 80 mg/dL
Non-HDL Cholesterol (Calc): 101 mg/dL (ref <130)
Total CHOL/HDL Ratio: 2.6 (calc) (ref <5.0)
Triglycerides: 109 mg/dL (ref <150)

## 2023-12-24 ENCOUNTER — Other Ambulatory Visit: Payer: Self-pay | Admitting: Medical Genetics

## 2023-12-24 DIAGNOSIS — Z006 Encounter for examination for normal comparison and control in clinical research program: Secondary | ICD-10-CM

## 2024-02-08 ENCOUNTER — Encounter: Payer: Self-pay | Admitting: Family Medicine

## 2024-02-08 ENCOUNTER — Ambulatory Visit: Admitting: Family Medicine

## 2024-02-08 VITALS — BP 132/70 | HR 85 | Resp 16 | Ht 63.0 in | Wt 201.2 lb

## 2024-02-08 DIAGNOSIS — G4733 Obstructive sleep apnea (adult) (pediatric): Secondary | ICD-10-CM

## 2024-02-08 DIAGNOSIS — I998 Other disorder of circulatory system: Secondary | ICD-10-CM

## 2024-02-08 DIAGNOSIS — M545 Low back pain, unspecified: Secondary | ICD-10-CM

## 2024-02-08 DIAGNOSIS — I129 Hypertensive chronic kidney disease with stage 1 through stage 4 chronic kidney disease, or unspecified chronic kidney disease: Secondary | ICD-10-CM | POA: Diagnosis not present

## 2024-02-08 DIAGNOSIS — N1831 Chronic kidney disease, stage 3a: Secondary | ICD-10-CM

## 2024-02-08 MED ORDER — HYDRALAZINE HCL 10 MG PO TABS: 10.0000 mg | ORAL_TABLET | Freq: Three times a day (TID) | ORAL | 0 refills | Status: DC | PRN

## 2024-02-08 NOTE — Progress Notes (Signed)
 Name: Lisa Steele   MRN: [id number]    DOB: 06-09-1945   Date:02/08/2024       Progress Note  Subjective  Chief Complaint  Chief Complaint  Patient presents with   Hypertension    Discussed the use of AI scribe software for clinical note transcription with the patient, who gave verbal consent to proceed.  History of Present Illness Lisa Steele is a 78 year old female with hypertension who presents with fluctuating blood pressure readings.  She has been experiencing inconsistent blood pressure readings at home, ranging from 116/77 to 168/unknown. She began more frequent monitoring after noticing elevated readings in November. She checks her blood pressure twice daily using both a wrist and an arm monitor, noting discrepancies between the two devices. Most readings are in the 140s, but occasionally drop to 114/unknown or rise to 168/unknown. No associated symptoms such as chest pain, palpitations, or headaches are present. She does report some swelling, particularly in one ankle, but it is not severe.  Her current medications for blood pressure management include  Benicar  hydrochlorothiazide  40/12.5 mg and amlodipine  2.5 mg. She has a history of taking prednisone for inflammation, which she discontinued after suspecting it might be affecting her blood pressure. She also has a history of obstructive sleep apnea and uses a CPAP machine regularly.  She mentions a 'catch' in her upper back that causes sharp pain upon movement, which she describes as muscular in nature.  She has a history of chronic kidney disease with fluctuating GFR readings.    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   Trigger finger of left thumb 03/04/2018   S/P rotator cuff repair 01/12/2018   Injury of left rotator cuff 11/03/2017   Dysmetabolic syndrome 06/22/2014   Post-menopausal 06/22/2014   H/O cold sores 06/22/2014   Dyslipidemia 06/22/2014   Benign essential HTN 06/22/2014   Abnormal ECG 06/22/2014    Social History   Tobacco Use   Smoking status: Former    Current packs/day: 0.00    Types: Cigarettes    Start date: 03/03/1958    Quit date: 1980    Years since quitting: 45.9   Smokeless tobacco: Never  Substance Use Topics   Alcohol  use: Yes    Alcohol /week: 0.0 standard drinks of alcohol     Comment: socially     Current Outpatient Medications:    amLODipine  (NORVASC ) 2.5 MG tablet, Take 1 tablet (2.5 mg total) by mouth daily., Disp: 90 tablet, Rfl: 1   aspirin  EC 81 MG tablet, Take 1 tablet (81 mg total) by mouth every evening. Swallow whole., Disp: 90 tablet, Rfl: 3   clindamycin  (CLEOCIN ) 150 MG capsule, Take 150 mg by mouth as needed (Take 4 caps 1 hour prior to appt.)., Disp: , Rfl:    mometasone  (ELOCON ) 0.1 % lotion, Apply 1 each topically daily. For ear pruritus, Disp: 60 mL, Rfl: 0   olmesartan -hydrochlorothiazide  (BENICAR  HCT) 40-12.5 MG tablet, Take 1 tablet by mouth daily., Disp: 90 tablet, Rfl: 1   Polyethyl Glycol-Propyl Glycol (SYSTANE) 0.4-0.3 % SOLN, Place 1 drop into both eyes at bedtime., Disp: , Rfl:    polyethylene  glycol powder (GLYCOLAX /MIRALAX ) 17 GM/SCOOP powder, Take 17 g by mouth daily., Disp: 3350 g, Rfl: 0   prednisoLONE acetate (PRED FORTE) 1 % ophthalmic suspension, SHAKE LIQUID AND INSTILL 1 DROP IN LEFT EYE THREE TIMES DAILY, Disp: , Rfl:    rosuvastatin  (CRESTOR ) 20 MG tablet, Take 1 tablet (20 mg total) by mouth daily., Disp: 90 tablet, Rfl: 1  Allergies  Allergen Reactions   Tramadol Nausea And Vomiting   Doxycycline Itching   Penicillins Hives and Rash    leg lesions and weakness  (couldn't walk)  Did it involve swelling of the face/tongue/throat, SOB, or low BP? No Did it involve sudden or severe rash/hives, skin peeling, or any reaction on the inside of your mouth or nose? No Did you need to seek medical attention at a hospital or doctor's office? Yes When did it last happen?      38 years If all above answers are "NO", may proceed with cephalosporin use.    ROS  Ten systems reviewed and is negative except as mentioned in HPI    Objective  Vitals:   02/08/24 0940 02/08/24 0942  BP: (!) 140/68 131/72  Pulse: 85   Resp: 16   SpO2: 98%   Weight: 201 lb 3.2 oz (91.3 kg)   Height: 5' 3 (1.6 m)     Body mass index is 35.64 kg/m.   Physical Exam CONSTITUTIONAL: Patient appears well-developed and well-nourished. No distress. HEENT: Head atraumatic, normocephalic, neck supple. CARDIOVASCULAR: Normal rate, regular rhythm and normal heart sounds. No murmur heard. No pitting edema in extremities. PULMONARY: Effort normal and breath sounds normal. Lungs clear to auscultation. No respiratory distress. ABDOMINAL: There is no tenderness or distention. Negative CVA tenderness.  MUSCULOSKELETAL: Normal gait. Without gross motor or sensory deficit. Mild soreness during palpation of right lower back  PSYCHIATRIC: Patient has a normal mood and affect. Behavior is normal. Judgment and thought content normal.  Recent Results (from the past 2160 hours)  Lipid panel     Status: None   Collection Time: 12/10/23 11:12 AM  Result Value Ref Range   Cholesterol 163 <200 mg/dL   HDL 62 > OR = 50 mg/dL   Triglycerides 890 <849 mg/dL   LDL Cholesterol (Calc) 80 mg/dL (calc)    Comment: Reference range: <100 . Desirable range <100 mg/dL for primary prevention;   <70 mg/dL for patients with CHD or diabetic patients  with > or = 2 CHD risk factors. SABRA LDL-C is now calculated using the Martin-Hopkins  calculation, which is a validated novel method providing  better accuracy  than the Friedewald equation in the  estimation of LDL-C.  Gladis APPLETHWAITE et al. SANDREA. 7986;689(80): 2061-2068  (http://education.[facility].com/faq/FAQ164)    Total CHOL/HDL Ratio 2.6 <5.0 (calc)   Non-HDL Cholesterol (Calc) 101 <130 mg/dL (calc)    Comment: For patients with diabetes plus 1 major ASCVD risk  factor, treating to a non-HDL-C goal of <100 mg/dL  (LDL-C of <29 mg/dL) is considered a therapeutic  option.   Comprehensive metabolic panel with GFR     Status: Abnormal   Collection Time: 12/10/23 11:12 AM  Result Value Ref Range   Glucose, Bld 119 (H) 65 - 99 mg/dL    Comment: .            Fasting reference interval . For someone without known diabetes, a glucose value between 100 and 125 mg/dL is consistent with prediabetes and should be confirmed with a follow-up test. .  BUN 14 7 - 25 mg/dL   Creat 9.04 9.39 - 8.99 mg/dL   eGFR 62 > OR = 60 fO/fpw/8.26f7   BUN/Creatinine Ratio SEE NOTE: 6 - 22 (calc)    Comment:    Not Reported: BUN and Creatinine are within    reference range. .    Sodium 142 135 - 146 mmol/L   Potassium 4.8 3.5 - 5.3 mmol/L   Chloride 103 98 - 110 mmol/L   CO2 32 20 - 32 mmol/L   Calcium  10.4 8.6 - 10.4 mg/dL   Total Protein 7.6 6.1 - 8.1 g/dL   Albumin 4.5 3.6 - 5.1 g/dL   Globulin 3.1 1.9 - 3.7 g/dL (calc)   AG Ratio 1.5 1.0 - 2.5 (calc)   Total Bilirubin 0.8 0.2 - 1.2 mg/dL   Alkaline phosphatase (APISO) 71 37 - 153 U/L   AST 19 10 - 35 U/L   ALT 15 6 - 29 U/L  VITAMIN D  25 Hydroxy (Vit-D Deficiency, Fractures)     Status: None   Collection Time: 12/10/23 11:12 AM  Result Value Ref Range   Vit D, 25-Hydroxy 55 30 - 100 ng/mL    Comment: Vitamin D  Status         25-OH Vitamin D : . Deficiency:                    <20 ng/mL Insufficiency:             20 - 29 ng/mL Optimal:                 > or = 30 ng/mL . For 25-OH Vitamin D  testing on patients on  D2-supplementation and patients for whom quantitation  of D2 and D3 fractions  is required, the [facility](TM) 25-OH VIT D, (D2,D3), LC/MS/MS is recommended: order  code 07111 (patients >5yrs). . See Note 1 . Note 1 . For additional information, please refer to  http://education.[facility].com/faq/FAQ199  (This link is being provided for informational/ educational purposes only.)   CBC with Differential/Platelet     Status: None   Collection Time: 12/10/23 11:12 AM  Result Value Ref Range   WBC 5.2 3.8 - 10.8 Thousand/uL   RBC 4.93 3.80 - 5.10 Million/uL   Hemoglobin 13.7 11.7 - 15.5 g/dL   HCT 57.6 64.9 - 54.9 %   MCV 85.8 80.0 - 100.0 fL   MCH 27.8 27.0 - 33.0 pg   MCHC 32.4 32.0 - 36.0 g/dL    Comment: For adults, a slight decrease in the calculated MCHC value (in the range of 30 to 32 g/dL) is most likely not clinically significant; however, it should be interpreted with caution in correlation with other red cell parameters and the patient's clinical condition.    RDW 14.5 11.0 - 15.0 %   Platelets 395 140 - 400 Thousand/uL   MPV 11.6 7.5 - 12.5 fL   Neutro Abs 1,971 1,500 - 7,800 cells/uL   Absolute Lymphocytes 2,538 850 - 3,900 cells/uL   Absolute Monocytes 556 200 - 950 cells/uL   Eosinophils Absolute 68 15 - 500 cells/uL   Basophils Absolute 68 0 - 200 cells/uL   Neutrophils Relative % 37.9 %   Total Lymphocyte 48.8 %   Monocytes Relative 10.7 %   Eosinophils Relative 1.3 %   Basophils Relative 1.3 %      Assessment & Plan Hypertension with chronic kidney disease stage 3A Blood pressure increased to 140s/80s, possibly due to fluid retention from  prednisone. Chronic kidney disease stage 3A with fluctuating GFR. Discussed anxiety from frequent monitoring. - Prescribed hydralazine  as needed for spikes over 150/85 mmHg, up to three times daily. - Advised follow-up with cardiologist if blood pressure remains elevated. - Instructed regular blood pressure monitoring and consistent readings before medication. - Advised low salt  intake and maintaining physical activity. - Instructed to monitor kidney function and avoid NSAIDs.  Obstructive sleep apnea Managed with CPAP therapy. - Continue CPAP therapy nightly.  Hyperlipidemia - Continue current cholesterol medication.  Musculoskeletal pain, upper back Intermittent sharp pain likely muscular. Discussed topical medication use. - Recommended over-the-counter Lidoderm  patches for pain relief.

## 2024-03-01 ENCOUNTER — Other Ambulatory Visit: Payer: Self-pay | Admitting: Family Medicine

## 2024-03-01 DIAGNOSIS — I998 Other disorder of circulatory system: Secondary | ICD-10-CM

## 2024-03-01 DIAGNOSIS — N1831 Chronic kidney disease, stage 3a: Secondary | ICD-10-CM

## 2024-03-02 NOTE — Telephone Encounter (Signed)
 Requested Prescriptions  Pending Prescriptions Disp Refills   hydrALAZINE  (APRESOLINE ) 10 MG tablet [Pharmacy Med Name: hydrALAZINE  HCl 10 MG Oral Tablet] 270 tablet 0    Sig: TAKE 1 TABLET BY MOUTH 3 TIMES  DAILY AS NEEDED IF BP ABOVE  150/85     Cardiovascular:  Vasodilators Failed - 03/02/2024  1:15 PM      Failed - ANA Screen, Ifa, Serum in normal range and within 360 days    No results found for: ANA, ANATITER, LABANTI       Passed - HCT in normal range and within 360 days    HCT  Date Value Ref Range Status  12/10/2023 42.3 35.0 - 45.0 % Final         Passed - HGB in normal range and within 360 days    Hemoglobin  Date Value Ref Range Status  12/10/2023 13.7 11.7 - 15.5 g/dL Final         Passed - RBC in normal range and within 360 days    RBC  Date Value Ref Range Status  12/10/2023 4.93 3.80 - 5.10 Million/uL Final         Passed - WBC in normal range and within 360 days    WBC  Date Value Ref Range Status  12/10/2023 5.2 3.8 - 10.8 Thousand/uL Final         Passed - PLT in normal range and within 360 days    Platelets  Date Value Ref Range Status  12/10/2023 395 140 - 400 Thousand/uL Final         Passed - Last BP in normal range    BP Readings from Last 1 Encounters:  02/08/24 132/70         Passed - Valid encounter within last 12 months    Recent Outpatient Visits           3 weeks ago Fluctuating blood pressure   [facility] Glenard Mire, MD   2 months ago Stage 3a chronic kidney disease [facility])   [facility] Glenard Mire, MD   7 months ago Postmenopausal bleeding   [facility] Glenard Mire, MD   8 months ago Morbid obesity [facility])   [facility] Sowles, Krichna, MD

## 2024-06-09 ENCOUNTER — Ambulatory Visit: Admitting: Family Medicine

## 2024-09-30 ENCOUNTER — Ambulatory Visit

## 2024-10-07 ENCOUNTER — Ambulatory Visit
